# Patient Record
Sex: Male | Born: 1965
Health system: Southern US, Community
[De-identification: ages and names within clinical notes are randomized; demographics above are authoritative.]

## PROBLEM LIST (undated history)

## (undated) DIAGNOSIS — M1990 Unspecified osteoarthritis, unspecified site: Secondary | ICD-10-CM

## (undated) DIAGNOSIS — F209 Schizophrenia, unspecified: Secondary | ICD-10-CM

## (undated) DIAGNOSIS — I639 Cerebral infarction, unspecified: Secondary | ICD-10-CM

## (undated) DIAGNOSIS — F329 Major depressive disorder, single episode, unspecified: Secondary | ICD-10-CM

## (undated) DIAGNOSIS — F32A Depression, unspecified: Secondary | ICD-10-CM

## (undated) DIAGNOSIS — I10 Essential (primary) hypertension: Secondary | ICD-10-CM

## (undated) DIAGNOSIS — F319 Bipolar disorder, unspecified: Secondary | ICD-10-CM

## (undated) DIAGNOSIS — E785 Hyperlipidemia, unspecified: Secondary | ICD-10-CM

## (undated) DIAGNOSIS — M109 Gout, unspecified: Secondary | ICD-10-CM

## (undated) DIAGNOSIS — K279 Peptic ulcer, site unspecified, unspecified as acute or chronic, without hemorrhage or perforation: Secondary | ICD-10-CM

## (undated) HISTORY — PX: NECK SURGERY: SHX720

## (undated) HISTORY — PX: OTHER SURGICAL HISTORY: SHX169

## (undated) HISTORY — PX: CERVICAL FUSION: SHX112

## (undated) HISTORY — PX: HIP SURGERY: SHX245

## (undated) HISTORY — DX: Hyperlipidemia, unspecified: E78.5

## (undated) HISTORY — PX: FOOT SURGERY: SHX648

---

## 2000-02-12 ENCOUNTER — Inpatient Hospital Stay (HOSPITAL_COMMUNITY): Admission: EM | Admit: 2000-02-12 | Discharge: 2000-02-15 | Payer: Self-pay | Admitting: Psychiatry

## 2000-10-07 ENCOUNTER — Emergency Department (HOSPITAL_COMMUNITY): Admission: EM | Admit: 2000-10-07 | Discharge: 2000-10-07 | Payer: Self-pay | Admitting: *Deleted

## 2000-10-07 ENCOUNTER — Encounter: Payer: Self-pay | Admitting: *Deleted

## 2000-10-19 ENCOUNTER — Emergency Department (HOSPITAL_COMMUNITY): Admission: EM | Admit: 2000-10-19 | Discharge: 2000-10-19 | Payer: Self-pay | Admitting: Emergency Medicine

## 2002-03-03 ENCOUNTER — Encounter: Payer: Self-pay | Admitting: Emergency Medicine

## 2002-03-03 ENCOUNTER — Emergency Department (HOSPITAL_COMMUNITY): Admission: EM | Admit: 2002-03-03 | Discharge: 2002-03-03 | Payer: Self-pay | Admitting: Emergency Medicine

## 2003-04-10 ENCOUNTER — Encounter: Admission: RE | Admit: 2003-04-10 | Discharge: 2003-04-10 | Payer: Self-pay | Admitting: Internal Medicine

## 2003-04-25 ENCOUNTER — Emergency Department (HOSPITAL_COMMUNITY): Admission: EM | Admit: 2003-04-25 | Discharge: 2003-04-26 | Payer: Self-pay | Admitting: *Deleted

## 2004-02-15 ENCOUNTER — Emergency Department (HOSPITAL_COMMUNITY): Admission: EM | Admit: 2004-02-15 | Discharge: 2004-02-15 | Payer: Self-pay | Admitting: Emergency Medicine

## 2004-07-03 ENCOUNTER — Encounter: Admission: RE | Admit: 2004-07-03 | Discharge: 2004-08-15 | Payer: Self-pay | Admitting: Orthopedic Surgery

## 2004-08-23 ENCOUNTER — Emergency Department (HOSPITAL_COMMUNITY): Admission: EM | Admit: 2004-08-23 | Discharge: 2004-08-23 | Payer: Self-pay | Admitting: Emergency Medicine

## 2004-09-13 ENCOUNTER — Encounter: Admission: RE | Admit: 2004-09-13 | Discharge: 2004-09-13 | Payer: Self-pay | Admitting: Internal Medicine

## 2004-10-15 ENCOUNTER — Emergency Department (HOSPITAL_COMMUNITY): Admission: EM | Admit: 2004-10-15 | Discharge: 2004-10-15 | Payer: Self-pay | Admitting: Emergency Medicine

## 2004-11-05 ENCOUNTER — Emergency Department (HOSPITAL_COMMUNITY): Admission: EM | Admit: 2004-11-05 | Discharge: 2004-11-05 | Payer: Self-pay | Admitting: Emergency Medicine

## 2005-03-21 ENCOUNTER — Emergency Department (HOSPITAL_COMMUNITY): Admission: EM | Admit: 2005-03-21 | Discharge: 2005-03-21 | Payer: Self-pay | Admitting: Emergency Medicine

## 2005-08-25 ENCOUNTER — Emergency Department (HOSPITAL_COMMUNITY): Admission: EM | Admit: 2005-08-25 | Discharge: 2005-08-26 | Payer: Self-pay | Admitting: Emergency Medicine

## 2006-01-07 ENCOUNTER — Emergency Department (HOSPITAL_COMMUNITY): Admission: EM | Admit: 2006-01-07 | Discharge: 2006-01-07 | Payer: Self-pay | Admitting: Emergency Medicine

## 2006-02-26 ENCOUNTER — Encounter (HOSPITAL_COMMUNITY): Admission: RE | Admit: 2006-02-26 | Discharge: 2006-03-28 | Payer: Self-pay | Admitting: Orthopedic Surgery

## 2006-03-10 ENCOUNTER — Encounter: Admission: RE | Admit: 2006-03-10 | Discharge: 2006-03-25 | Payer: Self-pay | Admitting: Orthopedic Surgery

## 2007-02-18 ENCOUNTER — Emergency Department (HOSPITAL_COMMUNITY): Admission: EM | Admit: 2007-02-18 | Discharge: 2007-02-18 | Payer: Self-pay | Admitting: Emergency Medicine

## 2007-06-22 ENCOUNTER — Inpatient Hospital Stay (HOSPITAL_COMMUNITY): Admission: AC | Admit: 2007-06-22 | Discharge: 2007-06-25 | Payer: Self-pay

## 2007-08-17 ENCOUNTER — Emergency Department (HOSPITAL_COMMUNITY): Admission: EM | Admit: 2007-08-17 | Discharge: 2007-08-17 | Payer: Self-pay | Admitting: Emergency Medicine

## 2007-10-22 ENCOUNTER — Emergency Department (HOSPITAL_COMMUNITY): Admission: EM | Admit: 2007-10-22 | Discharge: 2007-10-23 | Payer: Self-pay | Admitting: Emergency Medicine

## 2008-07-31 ENCOUNTER — Emergency Department (HOSPITAL_COMMUNITY): Admission: EM | Admit: 2008-07-31 | Discharge: 2008-08-01 | Payer: Self-pay | Admitting: Emergency Medicine

## 2008-11-10 ENCOUNTER — Other Ambulatory Visit: Payer: Self-pay | Admitting: Emergency Medicine

## 2008-11-10 ENCOUNTER — Inpatient Hospital Stay (HOSPITAL_COMMUNITY): Admission: AD | Admit: 2008-11-10 | Discharge: 2008-11-13 | Payer: Self-pay | Admitting: Psychiatry

## 2008-11-10 ENCOUNTER — Ambulatory Visit: Payer: Self-pay | Admitting: Psychiatry

## 2008-12-05 ENCOUNTER — Encounter: Admission: RE | Admit: 2008-12-05 | Discharge: 2009-01-25 | Payer: Self-pay | Admitting: *Deleted

## 2009-01-18 ENCOUNTER — Emergency Department (HOSPITAL_COMMUNITY): Admission: EM | Admit: 2009-01-18 | Discharge: 2009-01-18 | Payer: Self-pay | Admitting: Emergency Medicine

## 2009-03-07 ENCOUNTER — Emergency Department (HOSPITAL_COMMUNITY): Admission: EM | Admit: 2009-03-07 | Discharge: 2009-03-07 | Payer: Self-pay | Admitting: Emergency Medicine

## 2009-04-02 ENCOUNTER — Emergency Department (HOSPITAL_COMMUNITY): Admission: EM | Admit: 2009-04-02 | Discharge: 2009-04-02 | Payer: Self-pay | Admitting: Emergency Medicine

## 2009-10-24 ENCOUNTER — Encounter
Admission: RE | Admit: 2009-10-24 | Discharge: 2010-01-22 | Payer: Self-pay | Source: Home / Self Care | Attending: *Deleted | Admitting: *Deleted

## 2010-02-17 ENCOUNTER — Encounter: Payer: Self-pay | Admitting: Internal Medicine

## 2010-05-02 LAB — URINALYSIS, ROUTINE W REFLEX MICROSCOPIC
Hgb urine dipstick: NEGATIVE
Nitrite: NEGATIVE
Protein, ur: NEGATIVE mg/dL
Urobilinogen, UA: 0.2 mg/dL (ref 0.0–1.0)

## 2010-05-02 LAB — BASIC METABOLIC PANEL
BUN: 9 mg/dL (ref 6–23)
CO2: 25 mEq/L (ref 19–32)
Calcium: 8.6 mg/dL (ref 8.4–10.5)
Chloride: 101 mEq/L (ref 96–112)
Creatinine, Ser: 0.98 mg/dL (ref 0.4–1.5)
Glucose, Bld: 143 mg/dL — ABNORMAL HIGH (ref 70–99)

## 2010-05-02 LAB — RAPID URINE DRUG SCREEN, HOSP PERFORMED
Amphetamines: NOT DETECTED
Barbiturates: NOT DETECTED
Tetrahydrocannabinol: NOT DETECTED

## 2010-05-02 LAB — CBC
MCHC: 35.4 g/dL (ref 30.0–36.0)
MCV: 94.8 fL (ref 78.0–100.0)
Platelets: 163 10*3/uL (ref 150–400)
RDW: 13.3 % (ref 11.5–15.5)

## 2010-05-02 LAB — DIFFERENTIAL
Basophils Absolute: 0 10*3/uL (ref 0.0–0.1)
Basophils Relative: 1 % (ref 0–1)
Eosinophils Absolute: 0.2 10*3/uL (ref 0.0–0.7)
Monocytes Relative: 7 % (ref 3–12)
Neutro Abs: 4.8 10*3/uL (ref 1.7–7.7)
Neutrophils Relative %: 56 % (ref 43–77)
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
## 2010-06-14 NOTE — H&P (Signed)
[facility]  Patient:    Frank Moses, Frank Moses                          MRN: [id number] Adm. Date:  [id number] Attending:  Veneta Penton Dictator:   Candi Leash. Theressa Stamps, N.P.                   Psychiatric Admission Assessment  DATE OF ADMISSION:  February 12, 2000  IDENTIFYING INFORMATION:  This is a 45 year old, single black male, voluntary admitted on February 12, 2000, for alcohol detox.  HISTORY OF PRESENT ILLNESS:  The patient presents with a history of alcohol dependence, he has been drinking greater than 18 years.  He states for the past five years he has been drinking 12-24 beers per day.  The patient drinks alone socially.  He has had a one-week history of sobriety.  The patient denies any DTs, seizures or blackouts.  His last drink was at 10 p.m. on Tuesday, February 11, 2000.  The patient reports he has been sleeping well. His appetite has been good.  He denies any weight loss.  He denies any depression or anxiety.  He does admit to feeling angry and irritable when he drinks.  The patient feels motivated to stop drinking for his family and to see his daughter.  He denies auditory or visual hallucinations.  No suicidal or homicidal ideation.  No paranoia.  The patient [facility]  SOCIAL HISTORY:  He is a 45 year old, single black male.  He has two children, ages 76 and 20.  He lives with his mother and stepdad.  He works at [employer].  He has graduated from high school.  He has no financial or legal problems, although it does state that he has had three DUIs, last time being in June of 2001.  FAMILY HISTORY:  His father had problems with alcohol.  ALCOHOL/DRUG HISTORY:  He smokes two packs of cigarettes a day.  He has been smoking for 18 years.  His alcohol habits, the patient has been drinking since the age of 4 and as stated for the past five years has been drinking 12-24 beers per day.   He states he does not drink any hard liquor.  He denies any substance abuse.  PRIMARY CARE PHYSICIAN:  Dr. Florian Buff in [place].  PAST MEDICAL HISTORY:  Medical problems:  None.  MEDICATIONS:  None.  DRUG ALLERGIES:  None.  The patient states he is allergic to BEESTINGS.  PHYSICAL EXAMINATION:  Pending.  His lab results are pending.  The patient is 6 feet 2 inches, 180 pounds.  His vital signs are stable.  Blood pressure is slightly elevated at 149/87.  MENTAL STATUS EXAMINATION:  He is an alert, young black male.  He appears his stated age.  He is cooperative.  He is dressed in [employer].  He appears neat.  His speech is normal and relevant.  His mood is pleasant.  His affect is appropriate to mood.  His thought processes are coherent.  There is no evidence of psychosis.  No auditory or visual hallucinations.  No suicide or homicidal ideation.  No paranoia.  Cognitive function is intact.  His memory is good.  His judgment is poor.  Insight is fair.  Poor impulse control.  ADMISSION DIAGNOSES: Axis I:    Alcohol dependence. Axis II:   Deferred. Axis III:  None. Axis IV:  Mild, problems relating to primary support group. Axis V:    Current is 6, this past year is 70.  PLAN:  Voluntary admission to [facility] for alcohol dependence. Contract for safety.  Check q.15 minutes.  We will initiate the phenobarbital protocol.  Will encourage fluids.  Will add Depakote ER 500 mg at bedtime. Restoril ordered for sleep.  His labs are pending.  Our plan is to return him to his prior living arrangements with patient to attend AA after discharge.  TENTATIVE LENGTH OF STAY:  Three to five days. DD:  02/13/00 TD:  02/14/00 Job: [id number]

## 2010-06-14 NOTE — Consult Note (Signed)
[facility]  Patient:    Frank Moses, Frank Moses                          MRN: [id number] Adm. Date:  [id number] Disc. Date: [id number] Attending:  Veneta Penton Dictator:   Candi Leash. Theressa Stamps, N.P.                          Consultation Report  HISTORY OF PRESENT ILLNESS: This is a 45 year old single black male voluntarily admitted for alcohol detoxification.  The patient presented with a history of alcohol dependence, been drinking for over 18 years.  For the past five years, the patient has been drinking 12 to 24 beers per day.  He drinks socially.  The patient has had a one week history of sobriety.  The patient denies any DTs, seizures, or blackouts.  The patient reports he has been sleeping well.  His appetite has been good, he denies any weight loss, he denies any depression or anxiety; he does admit to feeling angry and irritable when he drinks.  The patient feels very motivated to stop drinking for his family and to see his daughter.  He denies any auditory hallucinations, no suicidal or homicidal ideations, no paranoia.  The patient has been in counseling for alcohol abuse at [facility].  PAST MEDICAL HISTORY:  Primary care physician is Dr. Florian Buff in [place].  No significant medical problems.  The patient takes no medications.  Drug allergies: None.  Physical examination: Height 6 feet 2 inches, weight 180 pounds.  Vital signs are stable although blood pressure was mildly elevated at 149/87.  Lab data: CBC was within normal limits. Chemistries were within normal limits.  Urine drug screen: Positive for barbiturates and benzodiazepines.  MENTAL STATUS EXAMINATION:  Alert, young black male, appears his stated age. Cooperative, dressed in hospital wear, he appears neat.  Speech is normal and relevant.  Mood is pleasant.  Affect is appropriate to mood.  Thought processes are coherent.  No evidence of psychosis, no auditory or visual hallucinations, no  suicidal or homicidal ideations, no paranoia.  Cognitive functioning is intact.  Memory is good.  Judgment is poor.  Insight is fair. Poor impulse control.  ADMITTING DIAGNOSES: Axis I:    Alcohol dependence. Axis II:   Deferred. Axis III:  None. Axis IV:   Mild with problems relating to primary support group. Axis V:    Current is 8, this past year is 70.  HOSPITAL COURSE:  The patient was a voluntary admission for alcohol dependence. The patient is to contract for safety.  He will be monitored every 15 minutes. Phenobarbital protocol was initiated for alcohol detoxification. Fluids well be encouraged.  Depakote ER 500 mg was ordered at bedtime with Restoril ordered for sleep.  The patient was progressing through the detoxification program unremarkably and was sleeping and eating well.  He was denying any mood shift into depression or mania and the patient was stating he was motivated to stop his drinking and attend [facility].  The patient continued to do well without any withdrawal symptoms, no anger or irritability, sleeping and eating well.  Mood and affect were euthymic.  It was felt that the patient could be discharged and managed on an outpatient basis.  FOLLOWUP: 1. [facility]; appointment schedules with phone number provided. 2. AA meetings. 3. The patient was also to get lab  work at [facility].  DISCHARGE MEDICATIONS: 1. Depakote ER 500 mg one tablet q.h.s. 2. Multivitamin one tablet p.o. q.d.  DISCHARGE DIAGNOSES: Axis I:    Alcohol dependence. Axis II:   Deferred. Axis III:  None. Axis IV:   Mild with problems relating to primary support group. Axis V:    Current is 38, this past year is 70.DD:  03/11/00 TD:  03/12/00 Job: [id number]

## 2010-10-17 LAB — CBC
Hemoglobin: 15.9
RBC: 4.86

## 2010-10-17 LAB — BASIC METABOLIC PANEL
CO2: 26
Calcium: 9
GFR calc Af Amer: >60
GFR calc non Af Amer: >60
Potassium: 3.5
Sodium: 137

## 2010-10-17 LAB — DIFFERENTIAL
Lymphocytes Relative: 23
Monocytes Absolute: 0.7
Monocytes Relative: 9
Neutro Abs: 5.3

## 2010-10-17 LAB — POCT CARDIAC MARKERS
CKMB, poc: 1.8
Myoglobin, poc: 52.1
Operator id: 213671
Troponin i, poc: <0.05

## 2010-10-17 LAB — PROTIME-INR: INR: 0.9

## 2010-10-23 LAB — POCT I-STAT, CHEM 8
Calcium, Ion: 1.01 — ABNORMAL LOW
Chloride: 109
Creatinine, Ser: 1.1
Glucose, Bld: 106 — ABNORMAL HIGH
HCT: 41

## 2010-10-23 LAB — TYPE AND SCREEN

## 2010-10-23 LAB — PROTIME-INR
INR: 1
Prothrombin Time: 13.3

## 2010-10-23 LAB — BASIC METABOLIC PANEL
BUN: 5 — ABNORMAL LOW
CO2: 24
Chloride: 110
Creatinine, Ser: 0.83
Glucose, Bld: 96
Potassium: 4.1

## 2010-10-23 LAB — CBC
HCT: 39
MCHC: 34.7
MCHC: 35.1
MCV: 94.4
Platelets: 202
RBC: 4.26
RDW: 13.1
WBC: 6
WBC: 8

## 2010-10-28 LAB — DIFFERENTIAL
Eosinophils Absolute: 0.3
Lymphs Abs: 2.6
Monocytes Relative: 8
Neutro Abs: 2.8
Neutrophils Relative %: 45

## 2010-10-28 LAB — CBC
MCV: 93.4
Platelets: 216
RBC: 4.98
WBC: 6.2

## 2010-10-28 LAB — BASIC METABOLIC PANEL
BUN: 5 — ABNORMAL LOW
Calcium: 9.1
Creatinine, Ser: 0.92
GFR calc Af Amer: >60
GFR calc non Af Amer: >60

## 2010-10-28 LAB — ETHANOL: Alcohol, Ethyl (B): 155 — ABNORMAL HIGH

## 2011-02-20 ENCOUNTER — Emergency Department (HOSPITAL_COMMUNITY)
Admission: EM | Admit: 2011-02-20 | Discharge: 2011-02-20 | Disposition: A | Discharge status: Home / Self Care | Payer: Medicare Other | Attending: Emergency Medicine | Admitting: Emergency Medicine

## 2011-02-20 ENCOUNTER — Encounter (HOSPITAL_COMMUNITY): Payer: Self-pay | Admitting: *Deleted

## 2011-02-20 DIAGNOSIS — Z79899 Other long term (current) drug therapy: Secondary | ICD-10-CM | POA: Insufficient documentation

## 2011-02-20 DIAGNOSIS — G8929 Other chronic pain: Secondary | ICD-10-CM | POA: Insufficient documentation

## 2011-02-20 DIAGNOSIS — M549 Dorsalgia, unspecified: Secondary | ICD-10-CM | POA: Insufficient documentation

## 2011-02-20 DIAGNOSIS — F17200 Nicotine dependence, unspecified, uncomplicated: Secondary | ICD-10-CM | POA: Insufficient documentation

## 2011-02-20 DIAGNOSIS — J329 Chronic sinusitis, unspecified: Secondary | ICD-10-CM | POA: Insufficient documentation

## 2011-02-20 DIAGNOSIS — F319 Bipolar disorder, unspecified: Secondary | ICD-10-CM | POA: Insufficient documentation

## 2011-02-20 DIAGNOSIS — Z8739 Personal history of other diseases of the musculoskeletal system and connective tissue: Secondary | ICD-10-CM | POA: Insufficient documentation

## 2011-02-20 DIAGNOSIS — I10 Essential (primary) hypertension: Secondary | ICD-10-CM | POA: Insufficient documentation

## 2011-02-20 DIAGNOSIS — J45909 Unspecified asthma, uncomplicated: Secondary | ICD-10-CM | POA: Insufficient documentation

## 2011-02-20 HISTORY — DX: Cerebral infarction, unspecified: I63.9

## 2011-02-20 HISTORY — DX: Essential (primary) hypertension: I10

## 2011-02-20 HISTORY — DX: Unspecified osteoarthritis, unspecified site: M19.90

## 2011-02-20 HISTORY — DX: Major depressive disorder, single episode, unspecified: F32.9

## 2011-02-20 HISTORY — DX: Bipolar disorder, unspecified: F31.9

## 2011-02-20 HISTORY — DX: Schizophrenia, unspecified: F20.9

## 2011-02-20 HISTORY — DX: Peptic ulcer, site unspecified, unspecified as acute or chronic, without hemorrhage or perforation: K27.9

## 2011-02-20 HISTORY — DX: Depression, unspecified: F32.A

## 2011-02-20 MED ORDER — PSEUDOEPHEDRINE HCL 60 MG PO TABS: 60.0000 mg | ORAL_TABLET | Freq: Four times a day (QID) | ORAL | Status: AC

## 2011-02-20 MED ORDER — DEXAMETHASONE 6 MG PO TABS: ORAL_TABLET | ORAL | Status: AC

## 2011-02-20 MED ORDER — HYDROCODONE-ACETAMINOPHEN 7.5-325 MG PO TABS: 1.0000 | ORAL_TABLET | ORAL | Status: AC | PRN

## 2011-02-20 NOTE — ED Notes (Signed)
Lt ear "stopped up" and low back pain .

## 2011-02-20 NOTE — ED Provider Notes (Signed)
History     CSN: [id number]  Arrival date & time 02/20/11  1236   First MD Initiated Contact with Patient 02/20/11 1313      Chief Complaint  Patient presents with  . Otalgia    (Consider location/radiation/quality/duration/timing/severity/associated sxs/prior treatment) HPI Comments: Patient states that 2-3 times a year he has problems with his ears being" stopped up". He has been having problems with his ears over the last week. He has tried over-the-counter medications without success, and presents now to the emergency department for additional evaluation  Patient also has chronic back problems states he has problems with arthritis and is disabled. The patient states he's been doing more standing washing and make an occasional bending and thinks he may have aggravated his back as a result of that. He requests that this be evaluated also for assistance with his pain.  Patient is a 46 y.o. male presenting with ear pain. The history is provided by the patient.  Otalgia Pertinent negatives include no abdominal pain, no neck pain and no cough.    Past Medical History  Diagnosis Date  . Hypertension   . Arthritis   . Bipolar 1 disorder   . Schizophrenia   . Depression   . Stroke   . Asthma   . Peptic ulcer     Past Surgical History  Procedure Date  . Cervical fusion     History reviewed. No pertinent family history.  History  Substance Use Topics  . Smoking status: Current Everyday Smoker    Types: Cigarettes  . Smokeless tobacco: Not on file  . Alcohol Use: No      Review of Systems  Constitutional: Negative for activity change.       All ROS Neg except as noted in HPI  HENT: Positive for ear pain and postnasal drip. Negative for nosebleeds and neck pain.   Eyes: Negative for photophobia and discharge.  Respiratory: Negative for cough, shortness of breath and wheezing.   Cardiovascular: Negative for chest pain and palpitations.  Gastrointestinal: Negative for  abdominal pain and blood in stool.  Genitourinary: Negative for dysuria, frequency and hematuria.  Musculoskeletal: Positive for back pain and arthralgias.  Skin: Negative.   Neurological: Negative for dizziness, seizures and speech difficulty.  Psychiatric/Behavioral: Negative for hallucinations and confusion.    Allergies  Review of patient's allergies indicates no known allergies.  Home Medications   Current Outpatient Rx  Name Route Sig Dispense Refill  . VENLAFAXINE HCL ER 150 MG PO CP24 Oral Take 150 mg by mouth daily.    Marland Kitchen DEXAMETHASONE 6 MG PO TABS  1 po bid with food 12 tablet 0  . HYDROCODONE-ACETAMINOPHEN 7.5-325 MG PO TABS Oral Take 1 tablet by mouth every 4 (four) hours as needed for pain. 20 tablet 0  . PSEUDOEPHEDRINE HCL 60 MG PO TABS Oral Take 1 tablet (60 mg total) by mouth 4 (four) times daily. 30 tablet 0    BP 153/95  Pulse 72  Temp(Src) 98.4 F (36.9 C) (Oral)  Resp 18  Ht 6\' 2"  (1.88 m)  Wt 200 lb (90.719 kg)  BMI 25.68 kg/m2  SpO2 99%  Physical Exam  Nursing note and vitals reviewed. Constitutional: He is oriented to person, place, and time. He appears well-developed and well-nourished.  Non-toxic appearance.  HENT:  Head: Normocephalic.  Right Ear: Tympanic membrane and external ear normal.  Left Ear: Tympanic membrane and external ear normal.       Nasal congestion present. No pain  to palpation over the sinuses.  Eyes: EOM and lids are normal. Pupils are equal, round, and reactive to light.  Neck: Normal range of motion. Neck supple. Carotid bruit is not present.  Cardiovascular: Normal rate, regular rhythm, normal heart sounds, intact distal pulses and normal pulses.   Pulmonary/Chest: Breath sounds normal. No respiratory distress.  Abdominal: Soft. Bowel sounds are normal. There is no tenderness. There is no guarding.  Musculoskeletal: Normal range of motion.       Pain to palpation and attempted range of motion of the lower spine area.    Lymphadenopathy:       Head (right side): No submandibular adenopathy present.       Head (left side): No submandibular adenopathy present.    He has no cervical adenopathy.  Neurological: He is alert and oriented to person, place, and time. He has normal strength. No cranial nerve deficit or sensory deficit. He exhibits normal muscle tone. Coordination normal.  Skin: Skin is warm and dry.  Psychiatric: He has a normal mood and affect. His speech is normal.    ED Course  Procedures (including critical care time) Pulse oximetry 99% on room air. Within normal limits by my interpretation. Labs Reviewed - No data to display No results found.   1. Sinusitis   2. Back pain, chronic       MDM  I have reviewed nursing notes, vital signs, and all appropriate lab and imaging results for this patient. Patient examination is consistent with sinusitis. He has pain in the lower back with outpatient and attempted range of motion similar to previous bouts with chronic back pain. Prescription for Sudafed every 6 hours, Decadron 2 times daily with food, and Norco every 4 hours as needed for pain #20 given to the patient. Patient advised to see his primary physician for additional evaluation and treatment.       Kathie Dike, [place] 02/20/11 1422

## 2011-02-20 NOTE — ED Provider Notes (Signed)
Medical screening examination/treatment/procedure(s) were conducted as a shared visit with non-physician practitioner(s) and myself.  I personally evaluated the patient during the encounter Frank Moses Y.   [name], MD 02/20/11 1439

## 2011-08-19 ENCOUNTER — Encounter (HOSPITAL_COMMUNITY): Payer: Self-pay | Admitting: *Deleted

## 2011-08-19 ENCOUNTER — Emergency Department (HOSPITAL_COMMUNITY)
Admission: EM | Admit: 2011-08-19 | Discharge: 2011-08-19 | Disposition: A | Discharge status: Home / Self Care | Payer: Medicaid Other | Attending: Emergency Medicine | Admitting: Emergency Medicine

## 2011-08-19 DIAGNOSIS — J45909 Unspecified asthma, uncomplicated: Secondary | ICD-10-CM | POA: Insufficient documentation

## 2011-08-19 DIAGNOSIS — F17200 Nicotine dependence, unspecified, uncomplicated: Secondary | ICD-10-CM | POA: Insufficient documentation

## 2011-08-19 DIAGNOSIS — I10 Essential (primary) hypertension: Secondary | ICD-10-CM | POA: Insufficient documentation

## 2011-08-19 DIAGNOSIS — F209 Schizophrenia, unspecified: Secondary | ICD-10-CM | POA: Insufficient documentation

## 2011-08-19 DIAGNOSIS — Z8673 Personal history of transient ischemic attack (TIA), and cerebral infarction without residual deficits: Secondary | ICD-10-CM | POA: Insufficient documentation

## 2011-08-19 DIAGNOSIS — Z79899 Other long term (current) drug therapy: Secondary | ICD-10-CM | POA: Insufficient documentation

## 2011-08-19 DIAGNOSIS — R51 Headache: Secondary | ICD-10-CM | POA: Insufficient documentation

## 2011-08-19 DIAGNOSIS — F319 Bipolar disorder, unspecified: Secondary | ICD-10-CM | POA: Insufficient documentation

## 2011-08-19 LAB — SEDIMENTATION RATE: Sed Rate: 10 mm/hr (ref 0–16)

## 2011-08-19 MED ORDER — HYDROCODONE-ACETAMINOPHEN 5-325 MG PO TABS: 1.0000 | ORAL_TABLET | ORAL | Status: AC | PRN

## 2011-08-19 MED ORDER — HYDROMORPHONE HCL PF 2 MG/ML IJ SOLN
2.0000 mg | Freq: Once | INTRAMUSCULAR | Status: AC
  Administered 2011-08-19: 2 mg via INTRAMUSCULAR
  Filled 2011-08-19: qty 1

## 2011-08-19 MED ORDER — KETOROLAC TROMETHAMINE 60 MG/2ML IM SOLN
60.0000 mg | Freq: Once | INTRAMUSCULAR | Status: AC
  Administered 2011-08-19: 60 mg via INTRAMUSCULAR
  Filled 2011-08-19: qty 2

## 2011-08-19 NOTE — ED Provider Notes (Signed)
History     CSN: [id number]  Arrival date & time 08/19/11  2018   First MD Initiated Contact with Patient 08/19/11 2038      Chief Complaint  Patient presents with  . Headache     The history is provided by the patient.   patient reports one week of left-sided headache with focus located near his left eye.  He denies visual changes.  He has no discharge from his eye.  He has had no recent trauma to his heterozygote.  He denies pain with extraocular movement.  He has no fevers or chills.  Denies neck pain.  He has no weakness of his upper lower extremities.  He does not take anticoagulants.  He occasionally gets headaches but reports this is more severe.  He thinks it started after starting a new cholesterol medicine last week.  He is otherwise without symptoms.  His pain is mild to moderate at this time.  His pain is not improved with over-the-counter medications.  His pain is not worsened by light or loud noises  Past Medical History  Diagnosis Date  . Hypertension   . Arthritis   . Bipolar 1 disorder   . Schizophrenia   . Depression   . Stroke   . Asthma   . Peptic ulcer     Past Surgical History  Procedure Date  . Cervical fusion     No family history on file.  History  Substance Use Topics  . Smoking status: Current Everyday Smoker    Types: Cigarettes  . Smokeless tobacco: Not on file  . Alcohol Use: No      Review of Systems  Neurological: Positive for headaches.  All other systems reviewed and are negative.    Allergies  Tomato  Home Medications   Current Outpatient Rx  Name Route Sig Dispense Refill  . ASPIRIN-SALICYLAMIDE-CAFFEINE 325-95-16 MG PO TABS Oral Take 2 packets by mouth as needed.    Marland Kitchen HYDROCHLOROTHIAZIDE 25 MG PO TABS Oral Take 25 mg by mouth daily.    . IBUPROFEN 200 MG PO TABS Oral Take 800 mg by mouth as needed.    . INDOMETHACIN 50 MG PO CAPS Oral Take 50 mg by mouth 3 (three) times daily as needed. Gout Flare    . LANSOPRAZOLE 30  MG PO CPDR Oral Take 30 mg by mouth 2 (two) times daily.    Marland Kitchen NAPROXEN SODIUM 220 MG PO CAPS Oral Take 220 mg by mouth as needed.    . VENLAFAXINE HCL ER 150 MG PO CP24 Oral Take 150 mg by mouth daily.    Marland Kitchen HYDROCODONE-ACETAMINOPHEN 5-325 MG PO TABS Oral Take 1 tablet by mouth every 4 (four) hours as needed for pain. 15 tablet 0    BP 134/88  Pulse 76  Temp 98.3 F (36.8 C) (Oral)  Resp 18  Ht 6\' 2"  (1.88 m)  Wt 197 lb (89.359 kg)  BMI 25.29 kg/m2  SpO2 95%  Physical Exam  Nursing note and vitals reviewed. Constitutional: He is oriented to person, place, and time. He appears well-developed and well-nourished.  HENT:  Head: Normocephalic and atraumatic.  Eyes: EOM are normal. Pupils are equal, round, and reactive to light.       Left eye without periorbital edema or swelling or erythema.  No proptosis noted.   Neck: Normal range of motion.  Cardiovascular: Normal rate, regular rhythm, normal heart sounds and intact distal pulses.   Pulmonary/Chest: Effort normal and breath sounds normal. No  respiratory distress.  Abdominal: Soft. He exhibits no distension. There is no tenderness.  Musculoskeletal: Normal range of motion.  Neurological: He is alert and oriented to person, place, and time.       5/5 strength in major muscle groups of  bilateral upper and lower extremities. Speech normal. No facial asymetry.   Skin: Skin is warm and dry.  Psychiatric: He has a normal mood and affect. Judgment normal.    ED Course  Procedures (including critical care time)   Labs Reviewed  SEDIMENTATION RATE   No results found.   1. Headache       MDM  The patient has a normal neurologic exam.  He is no tenderness around his left temporal artery.  A sedimentation rate was sent but will be a send out this is more for followup purposes.  His pain is improved in the emergency department.  No indication for imaging.  Normal neuro exam no recent trauma.  Discharge home with PCP  followup        Lyanne Co, MD 08/19/11 2258

## 2011-08-19 NOTE — ED Notes (Signed)
States he started taking meds for cholestrol a week ago and developed a headache, states he feels like his left eye is going to pop out of his head, states this is the worse headache ever

## 2011-10-20 ENCOUNTER — Emergency Department (HOSPITAL_COMMUNITY): Payer: Medicare Other

## 2011-10-20 ENCOUNTER — Encounter (HOSPITAL_COMMUNITY): Payer: Self-pay | Admitting: *Deleted

## 2011-10-20 ENCOUNTER — Emergency Department (HOSPITAL_COMMUNITY)
Admission: EM | Admit: 2011-10-20 | Discharge: 2011-10-20 | Disposition: A | Discharge status: Home / Self Care | Payer: Medicare Other | Attending: Emergency Medicine | Admitting: Emergency Medicine

## 2011-10-20 DIAGNOSIS — Z8673 Personal history of transient ischemic attack (TIA), and cerebral infarction without residual deficits: Secondary | ICD-10-CM | POA: Insufficient documentation

## 2011-10-20 DIAGNOSIS — M129 Arthropathy, unspecified: Secondary | ICD-10-CM | POA: Insufficient documentation

## 2011-10-20 DIAGNOSIS — M79609 Pain in unspecified limb: Secondary | ICD-10-CM | POA: Insufficient documentation

## 2011-10-20 DIAGNOSIS — I10 Essential (primary) hypertension: Secondary | ICD-10-CM | POA: Insufficient documentation

## 2011-10-20 DIAGNOSIS — M109 Gout, unspecified: Secondary | ICD-10-CM

## 2011-10-20 DIAGNOSIS — F319 Bipolar disorder, unspecified: Secondary | ICD-10-CM | POA: Insufficient documentation

## 2011-10-20 DIAGNOSIS — F17200 Nicotine dependence, unspecified, uncomplicated: Secondary | ICD-10-CM | POA: Insufficient documentation

## 2011-10-20 DIAGNOSIS — F209 Schizophrenia, unspecified: Secondary | ICD-10-CM | POA: Insufficient documentation

## 2011-10-20 LAB — URIC ACID: Uric Acid, Serum: 8.8 mg/dL — ABNORMAL HIGH (ref 4.0–7.8)

## 2011-10-20 MED ORDER — PREDNISONE 10 MG PO TABS: ORAL_TABLET | ORAL | Status: DC

## 2011-10-20 MED ORDER — OXYCODONE-ACETAMINOPHEN 5-325 MG PO TABS
1.0000 | ORAL_TABLET | Freq: Once | ORAL | Status: AC
  Administered 2011-10-20: 1 via ORAL
  Filled 2011-10-20: qty 1

## 2011-10-20 MED ORDER — KETOROLAC TROMETHAMINE 60 MG/2ML IM SOLN
60.0000 mg | Freq: Once | INTRAMUSCULAR | Status: AC
  Administered 2011-10-20: 60 mg via INTRAMUSCULAR
  Filled 2011-10-20: qty 2

## 2011-10-20 MED ORDER — OXYCODONE-ACETAMINOPHEN 5-325 MG PO TABS: 1.0000 | ORAL_TABLET | ORAL | Status: AC | PRN

## 2011-10-20 NOTE — ED Provider Notes (Signed)
History     CSN: [id number]  Arrival date & time 10/20/11  1528   First MD Initiated Contact with Patient 10/20/11 1603      Chief Complaint  Patient presents with  . Foot Pain    (Consider location/radiation/quality/duration/timing/severity/associated sxs/prior treatment) HPI Comments: Patient with hx of gout c/o sudden onset of pain, swelling to his left foot.  Pain is worse with weight bearing.  Feels similar to previous gout attacks.  He denies trauma, calf pain, wound to the foot, or fever.  Patient is a 46 y.o. male presenting with lower extremity pain. The history is provided by the patient.  Foot Pain This is a recurrent problem. The current episode started in the past 7 days. The problem occurs constantly. The problem has been gradually worsening. Associated symptoms include arthralgias and joint swelling. Pertinent negatives include no chills, fever, nausea, neck pain, numbness, rash, visual change, vomiting or weakness. The symptoms are aggravated by standing and walking. He has tried nothing for the symptoms. The treatment provided no relief.    Past Medical History  Diagnosis Date  . Hypertension   . Arthritis   . Bipolar 1 disorder   . Schizophrenia   . Depression   . Stroke   . Asthma   . Peptic ulcer     Past Surgical History  Procedure Date  . Cervical fusion   . Bil foot surgery     History reviewed. No pertinent family history.  History  Substance Use Topics  . Smoking status: Current Every Day Smoker    Types: Cigarettes  . Smokeless tobacco: Not on file  . Alcohol Use: No      Review of Systems  Constitutional: Negative for fever and chills.  HENT: Negative for neck pain.   Gastrointestinal: Negative for nausea and vomiting.  Genitourinary: Negative for dysuria and difficulty urinating.  Musculoskeletal: Positive for joint swelling and arthralgias.  Skin: Negative for color change, rash and wound.  Neurological: Negative for weakness and  numbness.  All other systems reviewed and are negative.    Allergies  Shrimp and Tomato  Home Medications   Current Outpatient Rx  Name Route Sig Dispense Refill  . ASPIRIN-SALICYLAMIDE-CAFFEINE 325-95-16 MG PO TABS Oral Take 2 packets by mouth as needed.    Marland Kitchen HYDROCHLOROTHIAZIDE 25 MG PO TABS Oral Take 25 mg by mouth daily.    . IBUPROFEN 200 MG PO TABS Oral Take 800 mg by mouth as needed.    . INDOMETHACIN 50 MG PO CAPS Oral Take 50 mg by mouth 3 (three) times daily as needed. Gout Flare    . LANSOPRAZOLE 30 MG PO CPDR Oral Take 30 mg by mouth 2 (two) times daily.    Marland Kitchen NAPROXEN SODIUM 220 MG PO CAPS Oral Take 220 mg by mouth as needed.    . VENLAFAXINE HCL ER 150 MG PO CP24 Oral Take 150 mg by mouth daily.      BP 178/99  Pulse 76  Temp 99.7 F (37.6 C) (Oral)  Resp 18  Ht 6\' 2"  (1.88 m)  Wt 195 lb (88.451 kg)  BMI 25.04 kg/m2  SpO2 100%  Physical Exam  Nursing note and vitals reviewed. Constitutional: He is oriented to person, place, and time. He appears well-developed and well-nourished. No distress.  HENT:  Head: Normocephalic and atraumatic.  Cardiovascular: Normal rate, regular rhythm, normal heart sounds and intact distal pulses.   Pulmonary/Chest: Effort normal and breath sounds normal.  Musculoskeletal: He exhibits tenderness.  Left foot: He exhibits tenderness and swelling. He exhibits normal range of motion, no bony tenderness, normal capillary refill, no crepitus, no deformity and no laceration.       Feet:       ttp of the left medial foot.  Mild erythema and STS.  ROM is preserved.  DP pulse is brisk, sensation intact.  No lesions of the foot, abrasion, bruising or deformity.    Neurological: He is alert and oriented to person, place, and time. He exhibits normal muscle tone. Coordination normal.  Skin: Skin is warm and dry.    ED Course  Procedures (including critical care time)  Labs Reviewed  URIC ACID - Abnormal; Notable for the following:      Uric Acid, Serum 8.8 (*)     All other components within normal limits   Dg Foot Complete Left  10/20/2011  *RADIOLOGY REPORT*  Clinical Data: Left foot pain and swelling.  LEFT FOOT - COMPLETE 3+ VIEW  Comparison: 08/23/2004  Findings: Three views of the left foot were obtained.  Stable surgical clip or staple near the talocalcaneal joint.  Stable flat foot deformity.  Degenerative changes along the dorsal aspect of the foot.  No evidence for acute fracture or dislocation.  Chronic deformity of the talus.  IMPRESSION: No acute bony abnormality.   Original Report Authenticated By: Richarda Overlie, M.D.         MDM    Pt has STS and ttp of the medial left foot.  Mild erythema also present,  Hx of gout to same foot.  No fever, chills or open wounds to the foot.  Doubtful of infectious process.  Will treat with percocet, prednisone.  Pt agrees to close f/u with PMD or to return here if sx's worsen.  The patient appears reasonably screened and/or stabilized for discharge and I doubt any other medical condition or other [facility] requiring further screening, evaluation, or treatment in the ED at this time prior to discharge.    Rx;  Percocet #20 Prednisone taper   Perrie Ragin L. [place], [place] 10/22/11 1724

## 2011-10-20 NOTE — ED Notes (Signed)
Pain and swelling lt foot, says is due to gout,  No injury  , wearing a cam walker

## 2011-10-22 NOTE — ED Provider Notes (Signed)
Medical screening examination/treatment/procedure(s) were performed by non-physician practitioner and as supervising physician I was immediately available for consultation/collaboration.   Shelda Jakes, MD 10/22/11 [phone]

## 2011-11-11 ENCOUNTER — Encounter (HOSPITAL_COMMUNITY): Payer: Self-pay

## 2011-11-11 ENCOUNTER — Emergency Department (HOSPITAL_COMMUNITY)
Admission: EM | Admit: 2011-11-11 | Discharge: 2011-11-11 | Disposition: A | Discharge status: Home / Self Care | Payer: Medicare Other | Attending: Emergency Medicine | Admitting: Emergency Medicine

## 2011-11-11 DIAGNOSIS — M109 Gout, unspecified: Secondary | ICD-10-CM

## 2011-11-11 DIAGNOSIS — M129 Arthropathy, unspecified: Secondary | ICD-10-CM | POA: Insufficient documentation

## 2011-11-11 DIAGNOSIS — M79673 Pain in unspecified foot: Secondary | ICD-10-CM

## 2011-11-11 DIAGNOSIS — Z8673 Personal history of transient ischemic attack (TIA), and cerebral infarction without residual deficits: Secondary | ICD-10-CM | POA: Insufficient documentation

## 2011-11-11 DIAGNOSIS — I10 Essential (primary) hypertension: Secondary | ICD-10-CM | POA: Insufficient documentation

## 2011-11-11 DIAGNOSIS — F17200 Nicotine dependence, unspecified, uncomplicated: Secondary | ICD-10-CM | POA: Insufficient documentation

## 2011-11-11 DIAGNOSIS — F209 Schizophrenia, unspecified: Secondary | ICD-10-CM | POA: Insufficient documentation

## 2011-11-11 DIAGNOSIS — F319 Bipolar disorder, unspecified: Secondary | ICD-10-CM | POA: Insufficient documentation

## 2011-11-11 MED ORDER — OXYCODONE-ACETAMINOPHEN 5-325 MG PO TABS: 1.0000 | ORAL_TABLET | ORAL | Status: AC | PRN

## 2011-11-11 MED ORDER — KETOROLAC TROMETHAMINE 60 MG/2ML IM SOLN
60.0000 mg | Freq: Once | INTRAMUSCULAR | Status: AC
  Administered 2011-11-11: 60 mg via INTRAMUSCULAR
  Filled 2011-11-11: qty 2

## 2011-11-11 MED ORDER — OXYCODONE-ACETAMINOPHEN 5-325 MG PO TABS
1.0000 | ORAL_TABLET | Freq: Once | ORAL | Status: AC
  Administered 2011-11-11: 1 via ORAL
  Filled 2011-11-11: qty 1

## 2011-11-11 MED ORDER — PREDNISONE 10 MG PO TABS: ORAL_TABLET | ORAL | Status: DC

## 2011-11-11 NOTE — ED Provider Notes (Signed)
History     CSN: [id number]  Arrival date & time 11/11/11  1406   First MD Initiated Contact with Patient 11/11/11 1458      Chief Complaint  Patient presents with  . Gout    (Consider location/radiation/quality/duration/timing/severity/associated sxs/prior treatment) HPI Comments: Patient with a history of frequent gout attacks complains of sudden onset of pain to his left foot. He states pain began after he eating pork. He states the pain feels similar to previous gout attacks. He denies recent injury, swelling, excessive warmth, or any wounds to his foot. He also states that he is out of his pain medication.  Patient is a 46 y.o. male presenting with lower extremity pain. The history is provided by the patient.  Foot Pain This is a recurrent problem. The current episode started in the past 7 days. The problem occurs constantly. The problem has been gradually worsening. Associated symptoms include arthralgias and joint swelling. Pertinent negatives include no chills, fever, myalgias, nausea, neck pain, numbness, rash, vomiting or weakness. The symptoms are aggravated by standing and walking. He has tried NSAIDs for the symptoms. The treatment provided mild relief.    Past Medical History  Diagnosis Date  . Hypertension   . Arthritis   . Bipolar 1 disorder   . Schizophrenia   . Depression   . Stroke   . Asthma   . Peptic ulcer     Past Surgical History  Procedure Date  . Cervical fusion   . Bil foot surgery     No family history on file.  History  Substance Use Topics  . Smoking status: Current Every Day Smoker    Types: Cigarettes  . Smokeless tobacco: Not on file  . Alcohol Use: No      Review of Systems  Constitutional: Negative for fever and chills.  HENT: Negative for neck pain.   Gastrointestinal: Negative for nausea and vomiting.  Genitourinary: Negative for dysuria and difficulty urinating.  Musculoskeletal: Positive for joint swelling and  arthralgias. Negative for myalgias.  Skin: Negative for color change, rash and wound.  Neurological: Negative for weakness and numbness.  All other systems reviewed and are negative.    Allergies  Shrimp and Tomato  Home Medications   Current Outpatient Rx  Name Route Sig Dispense Refill  . ASPIRIN-SALICYLAMIDE-CAFFEINE 325-95-16 MG PO TABS Oral Take 2 packets by mouth as needed.    Marland Kitchen HYDROCHLOROTHIAZIDE 25 MG PO TABS Oral Take 25 mg by mouth daily.    . IBUPROFEN 200 MG PO TABS Oral Take 800 mg by mouth as needed.    . INDOMETHACIN 50 MG PO CAPS Oral Take 50 mg by mouth 3 (three) times daily as needed. Gout Flare    . LANSOPRAZOLE 30 MG PO CPDR Oral Take 30 mg by mouth 2 (two) times daily.    Marland Kitchen NAPROXEN SODIUM 220 MG PO CAPS Oral Take 220 mg by mouth as needed.    Marland Kitchen PREDNISONE 10 MG PO TABS  Take 6 tablets day one, 5 tablets day two, 4 tablets day three, 3 tablets day four, 2 tablets day five, then 1 tablet day six 21 tablet 0  . VENLAFAXINE HCL ER 150 MG PO CP24 Oral Take 150 mg by mouth daily.      BP 147/80  Pulse 72  Temp 98.6 F (37 C) (Oral)  Resp 20  Ht 6\' 2"  (1.88 m)  Wt 190 lb (86.183 kg)  BMI 24.39 kg/m2  SpO2 100%  Physical Exam  Nursing note and vitals reviewed. Constitutional: He is oriented to person, place, and time. He appears well-developed and well-nourished. No distress.  HENT:  Head: Normocephalic and atraumatic.  Cardiovascular: Normal rate, regular rhythm, normal heart sounds and intact distal pulses.   Pulmonary/Chest: Effort normal and breath sounds normal.  Musculoskeletal: He exhibits tenderness.       Left foot: He exhibits tenderness and swelling. He exhibits normal range of motion, no bony tenderness, normal capillary refill, no crepitus, no deformity and no laceration.       Medial left foot, great toe and medial ankle are ttp, mild STS is present. Slight erythema is present.   ROM is preserved. No abrasions, bruising, or wounds to the foot or  ankle. DP pulse is brisk, sensation intact.  No calf pain or edema  Neurological: He is alert and oriented to person, place, and time. He exhibits normal muscle tone. Coordination normal.  Skin: Skin is warm and dry.    ED Course  Procedures (including critical care time)  Labs Reviewed - No data to display No results found.      MDM    Previous ED charts reviewed by me.  Pt seen here 10/20/11 by me for similar sx's.  Uric acid was slightly elevated at that time   Patient has history of gout. Clinical suspicion for cellulitis or DVT is  Low.  Patient has appointment with his PMD tomorrow   Prescribed: Percocet #15 Prednisone taper   Zamier Eggebrecht L. Rubyann [place], [place] 11/13/11 1722

## 2011-11-11 NOTE — ED Notes (Signed)
Pt reports that his "gout to left "foot is flaring up and is out of his percocet for pain, unable to see pmd until in the am.

## 2011-11-11 NOTE — ED Notes (Signed)
Pain lt foot especially at m-p joint of great toe, redness present.Pt says he has been taking goody powders without relief.

## 2011-11-18 NOTE — ED Provider Notes (Signed)
Medical screening examination/treatment/procedure(s) were performed by non-physician practitioner and as supervising physician I was immediately available for consultation/collaboration.   Deneene Tarver L Sequoyah Ramone, MD 11/18/11 1517 

## 2012-02-27 ENCOUNTER — Emergency Department (HOSPITAL_COMMUNITY): Payer: Medicare Other

## 2012-02-27 ENCOUNTER — Emergency Department (HOSPITAL_COMMUNITY)
Admission: EM | Admit: 2012-02-27 | Discharge: 2012-02-27 | Disposition: A | Discharge status: Home / Self Care | Payer: Medicare Other | Attending: Emergency Medicine | Admitting: Emergency Medicine

## 2012-02-27 ENCOUNTER — Encounter (HOSPITAL_COMMUNITY): Payer: Self-pay | Admitting: *Deleted

## 2012-02-27 DIAGNOSIS — Z8673 Personal history of transient ischemic attack (TIA), and cerebral infarction without residual deficits: Secondary | ICD-10-CM | POA: Insufficient documentation

## 2012-02-27 DIAGNOSIS — F319 Bipolar disorder, unspecified: Secondary | ICD-10-CM | POA: Insufficient documentation

## 2012-02-27 DIAGNOSIS — S20219A Contusion of unspecified front wall of thorax, initial encounter: Secondary | ICD-10-CM

## 2012-02-27 DIAGNOSIS — M549 Dorsalgia, unspecified: Secondary | ICD-10-CM

## 2012-02-27 DIAGNOSIS — G8929 Other chronic pain: Secondary | ICD-10-CM | POA: Insufficient documentation

## 2012-02-27 DIAGNOSIS — F209 Schizophrenia, unspecified: Secondary | ICD-10-CM | POA: Insufficient documentation

## 2012-02-27 DIAGNOSIS — Z87828 Personal history of other (healed) physical injury and trauma: Secondary | ICD-10-CM | POA: Insufficient documentation

## 2012-02-27 DIAGNOSIS — M129 Arthropathy, unspecified: Secondary | ICD-10-CM | POA: Insufficient documentation

## 2012-02-27 DIAGNOSIS — F17200 Nicotine dependence, unspecified, uncomplicated: Secondary | ICD-10-CM | POA: Insufficient documentation

## 2012-02-27 DIAGNOSIS — Z7982 Long term (current) use of aspirin: Secondary | ICD-10-CM | POA: Insufficient documentation

## 2012-02-27 DIAGNOSIS — R059 Cough, unspecified: Secondary | ICD-10-CM | POA: Insufficient documentation

## 2012-02-27 DIAGNOSIS — I10 Essential (primary) hypertension: Secondary | ICD-10-CM | POA: Insufficient documentation

## 2012-02-27 DIAGNOSIS — Z8711 Personal history of peptic ulcer disease: Secondary | ICD-10-CM | POA: Insufficient documentation

## 2012-02-27 DIAGNOSIS — R05 Cough: Secondary | ICD-10-CM | POA: Insufficient documentation

## 2012-02-27 DIAGNOSIS — Z981 Arthrodesis status: Secondary | ICD-10-CM | POA: Insufficient documentation

## 2012-02-27 DIAGNOSIS — J45909 Unspecified asthma, uncomplicated: Secondary | ICD-10-CM | POA: Insufficient documentation

## 2012-02-27 DIAGNOSIS — IMO0002 Reserved for concepts with insufficient information to code with codable children: Secondary | ICD-10-CM | POA: Insufficient documentation

## 2012-02-27 DIAGNOSIS — Z79899 Other long term (current) drug therapy: Secondary | ICD-10-CM | POA: Insufficient documentation

## 2012-02-27 DIAGNOSIS — R209 Unspecified disturbances of skin sensation: Secondary | ICD-10-CM | POA: Insufficient documentation

## 2012-02-27 DIAGNOSIS — M79609 Pain in unspecified limb: Secondary | ICD-10-CM | POA: Insufficient documentation

## 2012-02-27 MED ORDER — PREDNISONE 50 MG PO TABS
60.0000 mg | ORAL_TABLET | Freq: Once | ORAL | Status: AC
  Administered 2012-02-27: 60 mg via ORAL
  Filled 2012-02-27: qty 1

## 2012-02-27 MED ORDER — HYDROCODONE-ACETAMINOPHEN 5-325 MG PO TABS: 2.0000 | ORAL_TABLET | Freq: Four times a day (QID) | ORAL | Status: DC | PRN

## 2012-02-27 MED ORDER — HYDROCODONE-ACETAMINOPHEN 5-325 MG PO TABS
2.0000 | ORAL_TABLET | Freq: Once | ORAL | Status: AC
  Administered 2012-02-27: 2 via ORAL
  Filled 2012-02-27: qty 2

## 2012-02-27 MED ORDER — IBUPROFEN 600 MG PO TABS: 600.0000 mg | ORAL_TABLET | Freq: Three times a day (TID) | ORAL | Status: DC | PRN

## 2012-02-27 NOTE — ED Notes (Signed)
Discharge instructions reviewed with pt, questions answered. Pt verbalized understanding.  
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
## 2012-03-16 DIAGNOSIS — G5600 Carpal tunnel syndrome, unspecified upper limb: Secondary | ICD-10-CM | POA: Insufficient documentation

## 2013-09-14 ENCOUNTER — Encounter (HOSPITAL_COMMUNITY): Payer: Self-pay | Admitting: Emergency Medicine

## 2013-09-14 ENCOUNTER — Emergency Department (HOSPITAL_COMMUNITY): Payer: Medicare HMO

## 2013-09-14 ENCOUNTER — Emergency Department (HOSPITAL_COMMUNITY)
Admission: EM | Admit: 2013-09-14 | Discharge: 2013-09-15 | Disposition: A | Discharge status: Home / Self Care | Payer: Medicare HMO | Attending: Emergency Medicine | Admitting: Emergency Medicine

## 2013-09-14 DIAGNOSIS — F319 Bipolar disorder, unspecified: Secondary | ICD-10-CM | POA: Insufficient documentation

## 2013-09-14 DIAGNOSIS — Z8673 Personal history of transient ischemic attack (TIA), and cerebral infarction without residual deficits: Secondary | ICD-10-CM | POA: Insufficient documentation

## 2013-09-14 DIAGNOSIS — J45909 Unspecified asthma, uncomplicated: Secondary | ICD-10-CM | POA: Insufficient documentation

## 2013-09-14 DIAGNOSIS — I10 Essential (primary) hypertension: Secondary | ICD-10-CM | POA: Insufficient documentation

## 2013-09-14 DIAGNOSIS — S0181XA Laceration without foreign body of other part of head, initial encounter: Secondary | ICD-10-CM

## 2013-09-14 DIAGNOSIS — F17200 Nicotine dependence, unspecified, uncomplicated: Secondary | ICD-10-CM | POA: Diagnosis not present

## 2013-09-14 DIAGNOSIS — Z79899 Other long term (current) drug therapy: Secondary | ICD-10-CM | POA: Diagnosis not present

## 2013-09-14 DIAGNOSIS — S058X9A Other injuries of unspecified eye and orbit, initial encounter: Secondary | ICD-10-CM | POA: Diagnosis not present

## 2013-09-14 DIAGNOSIS — S0180XA Unspecified open wound of other part of head, initial encounter: Secondary | ICD-10-CM | POA: Insufficient documentation

## 2013-09-14 DIAGNOSIS — S0990XA Unspecified injury of head, initial encounter: Secondary | ICD-10-CM | POA: Insufficient documentation

## 2013-09-14 DIAGNOSIS — M129 Arthropathy, unspecified: Secondary | ICD-10-CM | POA: Diagnosis not present

## 2013-09-14 DIAGNOSIS — F1010 Alcohol abuse, uncomplicated: Secondary | ICD-10-CM | POA: Diagnosis not present

## 2013-09-14 DIAGNOSIS — Z8711 Personal history of peptic ulcer disease: Secondary | ICD-10-CM | POA: Diagnosis not present

## 2013-09-14 DIAGNOSIS — Z8659 Personal history of other mental and behavioral disorders: Secondary | ICD-10-CM | POA: Diagnosis not present

## 2013-09-14 MED ORDER — LIDOCAINE HCL (PF) 1 % IJ SOLN
30.0000 mL | Freq: Once | INTRAMUSCULAR | Status: AC
  Administered 2013-09-15: 30 mL
  Filled 2013-09-14: qty 30
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
## 2013-09-15 MED ORDER — LIDOCAINE HCL (PF) 1 % IJ SOLN
INTRAMUSCULAR | Status: AC
  Filled 2013-09-15: qty 5

## 2013-09-15 NOTE — ED Provider Notes (Signed)
LACERATION REPAIR Performed by: Evalee Jefferson Authorized by: Evalee Jefferson Consent: Verbal consent obtained. Risks and benefits: risks, benefits and alternatives were discussed Consent given by: patient Patient identity confirmed: provided demographic data Prepped and Draped in normal sterile fashion Wound explored  Laceration Location: right eyebrow, 2 near linear lacerations, 2 cm and 1 cm length,  subcutaneous  Laceration Length: 2 cm and 1 cm, resp  No Foreign Bodies seen or palpated  Anesthesia: local infiltration  Local anesthetic: lidocaine 1% without epinephrine  Anesthetic total: 2 ml  Irrigation method: syringe Amount of cleaning: standard  Skin closure: ethilon 6-0  Number of sutures: 9  Technique: simple interrupted  Patient tolerance: Patient tolerated the procedure well with no immediate complications.   Evalee Jefferson, PA-C 09/15/13 [phone]
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
## 2013-09-15 NOTE — Discharge Instructions (Signed)
Facial Laceration Follow up for suture removal in 1 week. Return to the ED if you develop new or worsening symptoms.  A facial laceration is a cut on the face. These injuries can be painful and cause bleeding. Lacerations usually heal quickly, but they need special care to reduce scarring. DIAGNOSIS  Your health care provider will take a medical history, ask for details about how the injury occurred, and examine the wound to determine how deep the cut is. TREATMENT  Some facial lacerations may not require closure. Others may not be able to be closed because of an increased risk of infection. The risk of infection and the chance for successful closure will depend on various factors, including the amount of time since the injury occurred. The wound may be cleaned to help prevent infection. If closure is appropriate, pain medicines may be given if needed. Your health care provider will use stitches (sutures), wound glue (adhesive), or skin adhesive strips to repair the laceration. These tools bring the skin edges together to allow for faster healing and a better cosmetic outcome. If needed, you may also be given a tetanus shot. HOME CARE INSTRUCTIONS  Only take over-the-counter or prescription medicines as directed by your health care provider.  Follow your health care provider's instructions for wound care. These instructions will vary depending on the technique used for closing the wound. For Sutures:  Keep the wound clean and dry.   If you were given a bandage (dressing), you should change it at least once a day. Also change the dressing if it becomes wet or dirty, or as directed by your health care provider.   Wash the wound with soap and water 2 times a day. Rinse the wound off with water to remove all soap. Pat the wound dry with a clean towel.   After cleaning, apply a thin layer of the antibiotic ointment recommended by your health care provider. This will help prevent infection and keep  the dressing from sticking.   You may shower as usual after the first 24 hours. Do not soak the wound in water until the sutures are removed.   Get your sutures removed as directed by your health care provider. With facial lacerations, sutures should usually be taken out after 4-5 days to avoid stitch marks.   Wait a few days after your sutures are removed before applying any makeup. For Skin Adhesive Strips:  Keep the wound clean and dry.   Do not get the skin adhesive strips wet. You may bathe carefully, using caution to keep the wound dry.   If the wound gets wet, pat it dry with a clean towel.   Skin adhesive strips will fall off on their own. You may trim the strips as the wound heals. Do not remove skin adhesive strips that are still stuck to the wound. They will fall off in time.  For Wound Adhesive:  You may briefly wet your wound in the shower or bath. Do not soak or scrub the wound. Do not swim. Avoid periods of heavy sweating until the skin adhesive has fallen off on its own. After showering or bathing, gently pat the wound dry with a clean towel.   Do not apply liquid medicine, cream medicine, ointment medicine, or makeup to your wound while the skin adhesive is in place. This may loosen the film before your wound is healed.   If a dressing is placed over the wound, be careful not to apply tape directly over the  skin adhesive. This may cause the adhesive to be pulled off before the wound is healed.   Avoid prolonged exposure to sunlight or tanning lamps while the skin adhesive is in place.  The skin adhesive will usually remain in place for 5-10 days, then naturally fall off the skin. Do not pick at the adhesive film.  After Healing: Once the wound has healed, cover the wound with sunscreen during the day for 1 full year. This can help minimize scarring. Exposure to ultraviolet light in the first year will darken the scar. It can take 1-2 years for the scar to lose  its redness and to heal completely.  SEEK IMMEDIATE MEDICAL CARE IF:  You have redness, pain, or swelling around the wound.   You see ayellowish-white fluid (pus) coming from the wound.   You have chills or a fever.  MAKE SURE YOU:  Understand these instructions.  Will watch your condition.  Will get help right away if you are not doing well or get worse. Document Released: 02/21/2004 Document Revised: 11/03/2012 Document Reviewed: 08/26/2012 [facility] Patient Information 2015 [place], [place]. This information is not intended to replace advice given to you by your health care provider. Make sure you discuss any questions you have with your health care provider.

## 2014-01-30 ENCOUNTER — Emergency Department (HOSPITAL_COMMUNITY)
Admission: EM | Admit: 2014-01-30 | Discharge: 2014-01-31 | Disposition: A | Discharge status: Home / Self Care | Payer: Medicare HMO | Attending: Emergency Medicine | Admitting: Emergency Medicine

## 2014-01-30 DIAGNOSIS — F319 Bipolar disorder, unspecified: Secondary | ICD-10-CM | POA: Insufficient documentation

## 2014-01-30 DIAGNOSIS — S40012A Contusion of left shoulder, initial encounter: Secondary | ICD-10-CM

## 2014-01-30 DIAGNOSIS — Y92410 Unspecified street and highway as the place of occurrence of the external cause: Secondary | ICD-10-CM | POA: Diagnosis not present

## 2014-01-30 DIAGNOSIS — X58XXXA Exposure to other specified factors, initial encounter: Secondary | ICD-10-CM | POA: Insufficient documentation

## 2014-01-30 DIAGNOSIS — Z79899 Other long term (current) drug therapy: Secondary | ICD-10-CM | POA: Insufficient documentation

## 2014-01-30 DIAGNOSIS — T1490XA Injury, unspecified, initial encounter: Secondary | ICD-10-CM

## 2014-01-30 DIAGNOSIS — M1990 Unspecified osteoarthritis, unspecified site: Secondary | ICD-10-CM | POA: Insufficient documentation

## 2014-01-30 DIAGNOSIS — S79912A Unspecified injury of left hip, initial encounter: Secondary | ICD-10-CM | POA: Diagnosis not present

## 2014-01-30 DIAGNOSIS — T148XXA Other injury of unspecified body region, initial encounter: Secondary | ICD-10-CM

## 2014-01-30 DIAGNOSIS — Y9389 Activity, other specified: Secondary | ICD-10-CM | POA: Insufficient documentation

## 2014-01-30 DIAGNOSIS — Y998 Other external cause status: Secondary | ICD-10-CM | POA: Diagnosis not present

## 2014-01-30 DIAGNOSIS — Z720 Tobacco use: Secondary | ICD-10-CM | POA: Insufficient documentation

## 2014-01-30 DIAGNOSIS — S80212A Abrasion, left knee, initial encounter: Secondary | ICD-10-CM | POA: Diagnosis not present

## 2014-01-30 DIAGNOSIS — S40212A Abrasion of left shoulder, initial encounter: Secondary | ICD-10-CM | POA: Diagnosis not present

## 2014-01-30 DIAGNOSIS — Z8673 Personal history of transient ischemic attack (TIA), and cerebral infarction without residual deficits: Secondary | ICD-10-CM | POA: Diagnosis not present

## 2014-01-30 DIAGNOSIS — R52 Pain, unspecified: Secondary | ICD-10-CM

## 2014-01-30 DIAGNOSIS — J45909 Unspecified asthma, uncomplicated: Secondary | ICD-10-CM | POA: Diagnosis not present

## 2014-01-30 DIAGNOSIS — F10120 Alcohol abuse with intoxication, uncomplicated: Secondary | ICD-10-CM | POA: Diagnosis not present

## 2014-01-30 DIAGNOSIS — Z8711 Personal history of peptic ulcer disease: Secondary | ICD-10-CM | POA: Diagnosis not present

## 2014-01-30 DIAGNOSIS — I10 Essential (primary) hypertension: Secondary | ICD-10-CM | POA: Diagnosis not present

## 2014-01-30 DIAGNOSIS — R4182 Altered mental status, unspecified: Secondary | ICD-10-CM | POA: Diagnosis present

## 2014-01-30 DIAGNOSIS — F10920 Alcohol use, unspecified with intoxication, uncomplicated: Secondary | ICD-10-CM
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
## 2014-01-31 ENCOUNTER — Encounter (HOSPITAL_COMMUNITY): Payer: Self-pay | Admitting: *Deleted

## 2014-01-31 ENCOUNTER — Emergency Department (HOSPITAL_COMMUNITY): Payer: Medicare HMO

## 2014-01-31 LAB — CBG MONITORING, ED: GLUCOSE-CAPILLARY: 110 mg/dL — AB (ref 70–99)

## 2014-01-31 LAB — DIFFERENTIAL
Basophils Absolute: 0 10*3/uL (ref 0.0–0.1)
Basophils Relative: 0 % (ref 0–1)
EOS PCT: 3 % (ref 0–5)
Eosinophils Absolute: 0.2 10*3/uL (ref 0.0–0.7)
LYMPHS PCT: 34 % (ref 12–46)
Lymphs Abs: 2.1 10*3/uL (ref 0.7–4.0)
MONO ABS: 0.5 10*3/uL (ref 0.1–1.0)
Monocytes Relative: 9 % (ref 3–12)
Neutro Abs: 3.3 10*3/uL (ref 1.7–7.7)
Neutrophils Relative %: 54 % (ref 43–77)

## 2014-01-31 LAB — CBC
HEMATOCRIT: 47.1 % (ref 39.0–52.0)
HEMOGLOBIN: 17.1 g/dL — AB (ref 13.0–17.0)
MCH: 33.5 pg (ref 26.0–34.0)
MCHC: 36.3 g/dL — ABNORMAL HIGH (ref 30.0–36.0)
MCV: 92.4 fL (ref 78.0–100.0)
Platelets: 164 10*3/uL (ref 150–400)
RBC: 5.1 MIL/uL (ref 4.22–5.81)
RDW: 12.2 % (ref 11.5–15.5)
WBC: 6.3 10*3/uL (ref 4.0–10.5)

## 2014-01-31 LAB — ETHANOL: ALCOHOL ETHYL (B): 188 mg/dL — AB (ref 0–9)

## 2014-01-31 LAB — URINALYSIS, ROUTINE W REFLEX MICROSCOPIC
Bilirubin Urine: NEGATIVE
Glucose, UA: NEGATIVE mg/dL
Hgb urine dipstick: NEGATIVE
Ketones, ur: NEGATIVE mg/dL
LEUKOCYTES UA: NEGATIVE
NITRITE: NEGATIVE
PH: 6 (ref 5.0–8.0)
Protein, ur: NEGATIVE mg/dL
Urobilinogen, UA: 0.2 mg/dL (ref 0.0–1.0)

## 2014-01-31 LAB — COMPREHENSIVE METABOLIC PANEL
ALBUMIN: 4.5 g/dL (ref 3.5–5.2)
ALT: 35 U/L (ref 0–53)
AST: 36 U/L (ref 0–37)
Alkaline Phosphatase: 87 U/L (ref 39–117)
Anion gap: 12 (ref 5–15)
BUN: 9 mg/dL (ref 6–23)
CALCIUM: 9.1 mg/dL (ref 8.4–10.5)
CO2: 22 mmol/L (ref 19–32)
Chloride: 106 mEq/L (ref 96–112)
Creatinine, Ser: 0.77 mg/dL (ref 0.50–1.35)
GFR calc Af Amer: >90 mL/min (ref >90)
GFR calc non Af Amer: >90 mL/min (ref >90)
GLUCOSE: 119 mg/dL — AB (ref 70–99)
Potassium: 3.4 mmol/L — ABNORMAL LOW (ref 3.5–5.1)
SODIUM: 140 mmol/L (ref 135–145)
TOTAL PROTEIN: 8.1 g/dL (ref 6.0–8.3)
Total Bilirubin: 0.7 mg/dL (ref 0.3–1.2)

## 2014-01-31 MED ORDER — IOHEXOL 300 MG/ML  SOLN
100.0000 mL | Freq: Once | INTRAMUSCULAR | Status: AC | PRN
  Administered 2014-01-31: 100 mL via INTRAVENOUS

## 2014-01-31 MED ORDER — SODIUM CHLORIDE 0.9 % IJ SOLN
INTRAMUSCULAR | Status: AC
  Filled 2014-01-31: qty 500

## 2014-01-31 MED ORDER — SODIUM CHLORIDE 0.9 % IJ SOLN
INTRAMUSCULAR | Status: AC
  Filled 2014-01-31: qty 15

## 2014-01-31 MED ORDER — DIPHENHYDRAMINE HCL 50 MG/ML IJ SOLN: 25.0000 mg | Freq: Once | INTRAMUSCULAR | Status: DC
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
## 2014-01-31 NOTE — ED Notes (Signed)
Dr.glick in room with patient. [place] at side
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
## 2014-01-31 NOTE — ED Notes (Signed)
Up and dressed self in blue paper scrubs his own shoes and sweatshirt. Easily ambulatory with steady gait.
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
## 2014-01-31 NOTE — Discharge Instructions (Signed)
Take acetaminophen or ibuprofen as needed for pain.   Abrasion An abrasion is a cut or scrape of the skin. Abrasions do not extend through all layers of the skin and most heal within 10 days. It is important to care for your abrasion properly to prevent infection. CAUSES  Most abrasions are caused by falling on, or gliding across, the ground or other surface. When your skin rubs on something, the outer and inner layer of skin rubs off, causing an abrasion. DIAGNOSIS  Your caregiver will be able to diagnose an abrasion during a physical exam.  TREATMENT  Your treatment depends on how large and deep the abrasion is. Generally, your abrasion will be cleaned with water and a mild soap to remove any dirt or debris. An antibiotic ointment may be put over the abrasion to prevent an infection. A bandage (dressing) may be wrapped around the abrasion to keep it from getting dirty.  You may need a tetanus shot if:  You cannot remember when you had your last tetanus shot.  You have never had a tetanus shot.  The injury broke your skin. If you get a tetanus shot, your arm may swell, get red, and feel warm to the touch. This is common and not a problem. If you need a tetanus shot and you choose not to have one, there is a rare chance of getting tetanus. Sickness from tetanus can be serious.  HOME CARE INSTRUCTIONS   If a dressing was applied, change it at least once a day or as directed by your caregiver. If the bandage sticks, soak it off with warm water.   Wash the area with water and a mild soap to remove all the ointment 2 times a day. Rinse off the soap and pat the area dry with a clean towel.   Reapply any ointment as directed by your caregiver. This will help prevent infection and keep the bandage from sticking. Use gauze over the wound and under the dressing to help keep the bandage from sticking.   Change your dressing right away if it becomes wet or dirty.   Only take over-the-counter or  prescription medicines for pain, discomfort, or fever as directed by your caregiver.   Follow up with your caregiver within 24-48 hours for a wound check, or as directed. If you were not given a wound-check appointment, look closely at your abrasion for redness, swelling, or pus. These are signs of infection. SEEK IMMEDIATE MEDICAL CARE IF:   You have increasing pain in the wound.   You have redness, swelling, or tenderness around the wound.   You have pus coming from the wound.   You have a fever or persistent symptoms for more than 2-3 days.  You have a fever and your symptoms suddenly get worse.  You have a bad smell coming from the wound or dressing.  MAKE SURE YOU:   Understand these instructions.  Will watch your condition.  Will get help right away if you are not doing well or get worse. Document Released: 10/23/2004 Document Revised: 12/31/2011 Document Reviewed: 12/17/2010 [facility] Patient Information 2015 [place], [place]. This information is not intended to replace advice given to you by your health care provider. Make sure you discuss any questions you have with your health care provider.   Contusion A contusion is a deep bruise. Contusions are the result of an injury that caused bleeding under the skin. The contusion may turn blue, purple, or yellow. Minor injuries will give you a  painless contusion, but more severe contusions may stay painful and swollen for a few weeks.  CAUSES  A contusion is usually caused by a blow, trauma, or direct force to an area of the body. SYMPTOMS   Swelling and redness of the injured area.  Bruising of the injured area.  Tenderness and soreness of the injured area.  Pain. DIAGNOSIS  The diagnosis can be made by taking a history and physical exam. An X-ray, CT scan, or MRI may be needed to determine if there were any associated injuries, such as fractures. TREATMENT  Specific treatment will depend on what area of the body was  injured. In general, the best treatment for a contusion is resting, icing, elevating, and applying cold compresses to the injured area. Over-the-counter medicines may also be recommended for pain control. Ask your caregiver what the best treatment is for your contusion. HOME CARE INSTRUCTIONS   Put ice on the injured area.  Put ice in a plastic bag.  Place a towel between your skin and the bag.  Leave the ice on for 15-20 minutes, 3-4 times a day, or as directed by your health care provider.  Only take over-the-counter or prescription medicines for pain, discomfort, or fever as directed by your caregiver. Your caregiver may recommend avoiding anti-inflammatory medicines (aspirin, ibuprofen, and naproxen) for 48 hours because these medicines may increase bruising.  Rest the injured area.  If possible, elevate the injured area to reduce swelling. SEEK IMMEDIATE MEDICAL CARE IF:   You have increased bruising or swelling.  You have pain that is getting worse.  Your swelling or pain is not relieved with medicines. MAKE SURE YOU:   Understand these instructions.  Will watch your condition.  Will get help right away if you are not doing well or get worse. Document Released: 10/23/2004 Document Revised: 01/18/2013 Document Reviewed: 11/18/2010 [facility] Patient Information 2015 [place], [place]. This information is not intended to replace advice given to you by your health care provider. Make sure you discuss any questions you have with your health care provider.  Alcohol Intoxication Alcohol intoxication occurs when the amount of alcohol that a person has consumed impairs his or her ability to mentally and physically function. Alcohol directly impairs the normal chemical activity of the brain. Drinking large amounts of alcohol can lead to changes in mental function and behavior, and it can cause many physical effects that can be harmful.  Alcohol intoxication can range in severity from mild  to very severe. Various factors can affect the level of intoxication that occurs, such as the person's age, gender, weight, frequency of alcohol consumption, and the presence of other medical conditions (such as diabetes, seizures, or heart conditions). Dangerous levels of alcohol intoxication may occur when people drink large amounts of alcohol in a short period (binge drinking). Alcohol can also be especially dangerous when combined with certain prescription medicines or "recreational" drugs. SIGNS AND SYMPTOMS Some common signs and symptoms of mild alcohol intoxication include:  Loss of coordination.  Changes in mood and behavior.  Impaired judgment.  Slurred speech. As alcohol intoxication progresses to more severe levels, other signs and symptoms will appear. These may include:  Vomiting.  Confusion and impaired memory.  Slowed breathing.  Seizures.  Loss of consciousness. DIAGNOSIS  Your health care provider will take a medical history and perform a physical exam. You will be asked about the amount and type of alcohol you have consumed. Blood tests will be done to measure the concentration of  alcohol in your blood. In many places, your blood alcohol level must be lower than 80 mg/dL (0.08%) to legally drive. However, many dangerous effects of alcohol can occur at much lower levels.  TREATMENT  People with alcohol intoxication often do not require treatment. Most of the effects of alcohol intoxication are temporary, and they go away as the alcohol naturally leaves the body. Your health care provider will monitor your condition until you are stable enough to go home. Fluids are sometimes given through an IV access tube to help prevent dehydration.  HOME CARE INSTRUCTIONS  Do not drive after drinking alcohol.  Stay hydrated. Drink enough water and fluids to keep your urine clear or pale yellow. Avoid caffeine.   Only take over-the-counter or prescription medicines as directed by  your health care provider.  SEEK MEDICAL CARE IF:   You have persistent vomiting.   You do not feel better after a few days.  You have frequent alcohol intoxication. Your health care provider can help determine if you should see a substance use treatment counselor. SEEK IMMEDIATE MEDICAL CARE IF:   You become shaky or tremble when you try to stop drinking.   You shake uncontrollably (seizure).   You throw up (vomit) blood. This may be bright red or may look like black coffee grounds.   You have blood in your stool. This may be bright red or may appear as a black, tarry, bad smelling stool.   You become lightheaded or faint.  MAKE SURE YOU:   Understand these instructions.  Will watch your condition.  Will get help right away if you are not doing well or get worse. Document Released: 10/23/2004 Document Revised: 09/15/2012 Document Reviewed: 06/18/2012 [facility] Patient Information 2015 [place], [place]. This information is not intended to replace advice given to you by your health care provider. Make sure you discuss any questions you have with your health care provider.

## 2014-01-31 NOTE — ED Notes (Signed)
Involuntary papers competed via dr. Roxanne Mins.

## 2014-01-31 NOTE — ED Notes (Signed)
Much calmer, coherent.re eval by Dr Roxanne Mins

## 2014-02-13 ENCOUNTER — Encounter (HOSPITAL_COMMUNITY): Payer: Self-pay

## 2014-02-13 ENCOUNTER — Inpatient Hospital Stay (HOSPITAL_COMMUNITY)
Admission: EM | Admit: 2014-02-13 | Discharge: 2014-02-14 | DRG: 917 | Disposition: A | Discharge status: Other Acute Inpatient Hospital | Payer: Medicare HMO | Attending: Internal Medicine | Admitting: Internal Medicine

## 2014-02-13 DIAGNOSIS — T43212A Poisoning by selective serotonin and norepinephrine reuptake inhibitors, intentional self-harm, initial encounter: Secondary | ICD-10-CM | POA: Diagnosis present

## 2014-02-13 DIAGNOSIS — G92 Toxic encephalopathy: Secondary | ICD-10-CM | POA: Diagnosis present

## 2014-02-13 DIAGNOSIS — T50902A Poisoning by unspecified drugs, medicaments and biological substances, intentional self-harm, initial encounter: Secondary | ICD-10-CM

## 2014-02-13 DIAGNOSIS — Y903 Blood alcohol level of 60-79 mg/100 ml: Secondary | ICD-10-CM | POA: Diagnosis present

## 2014-02-13 DIAGNOSIS — F209 Schizophrenia, unspecified: Secondary | ICD-10-CM | POA: Diagnosis present

## 2014-02-13 DIAGNOSIS — F1010 Alcohol abuse, uncomplicated: Secondary | ICD-10-CM | POA: Diagnosis present

## 2014-02-13 DIAGNOSIS — Z8673 Personal history of transient ischemic attack (TIA), and cerebral infarction without residual deficits: Secondary | ICD-10-CM

## 2014-02-13 DIAGNOSIS — Z609 Problem related to social environment, unspecified: Secondary | ICD-10-CM | POA: Diagnosis present

## 2014-02-13 DIAGNOSIS — R21 Rash and other nonspecific skin eruption: Secondary | ICD-10-CM | POA: Diagnosis present

## 2014-02-13 DIAGNOSIS — T481X2A Poisoning by skeletal muscle relaxants [neuromuscular blocking agents], intentional self-harm, initial encounter: Secondary | ICD-10-CM | POA: Diagnosis present

## 2014-02-13 DIAGNOSIS — F332 Major depressive disorder, recurrent severe without psychotic features: Secondary | ICD-10-CM | POA: Diagnosis not present

## 2014-02-13 DIAGNOSIS — F319 Bipolar disorder, unspecified: Secondary | ICD-10-CM | POA: Diagnosis present

## 2014-02-13 DIAGNOSIS — I10 Essential (primary) hypertension: Secondary | ICD-10-CM | POA: Diagnosis not present

## 2014-02-13 DIAGNOSIS — E876 Hypokalemia: Secondary | ICD-10-CM | POA: Diagnosis present

## 2014-02-13 DIAGNOSIS — R4182 Altered mental status, unspecified: Secondary | ICD-10-CM | POA: Diagnosis present

## 2014-02-13 DIAGNOSIS — T50901A Poisoning by unspecified drugs, medicaments and biological substances, accidental (unintentional), initial encounter: Secondary | ICD-10-CM | POA: Diagnosis present

## 2014-02-13 DIAGNOSIS — F17210 Nicotine dependence, cigarettes, uncomplicated: Secondary | ICD-10-CM | POA: Diagnosis present

## 2014-02-13 DIAGNOSIS — E1159 Type 2 diabetes mellitus with other circulatory complications: Secondary | ICD-10-CM | POA: Diagnosis present

## 2014-02-13 DIAGNOSIS — J45909 Unspecified asthma, uncomplicated: Secondary | ICD-10-CM | POA: Diagnosis present

## 2014-02-13 DIAGNOSIS — M1990 Unspecified osteoarthritis, unspecified site: Secondary | ICD-10-CM | POA: Diagnosis present

## 2014-02-13 DIAGNOSIS — L0231 Cutaneous abscess of buttock: Secondary | ICD-10-CM | POA: Diagnosis not present

## 2014-02-13 DIAGNOSIS — R45851 Suicidal ideations: Secondary | ICD-10-CM

## 2014-02-13 DIAGNOSIS — T1491XA Suicide attempt, initial encounter: Secondary | ICD-10-CM

## 2014-02-13 LAB — CBC WITH DIFFERENTIAL/PLATELET
Basophils Absolute: 0 10*3/uL (ref 0.0–0.1)
Basophils Relative: 0 % (ref 0–1)
EOS ABS: 0.3 10*3/uL (ref 0.0–0.7)
Eosinophils Relative: 3 % (ref 0–5)
HCT: 46.6 % (ref 39.0–52.0)
Hemoglobin: 17 g/dL (ref 13.0–17.0)
LYMPHS PCT: 36 % (ref 12–46)
Lymphs Abs: 2.7 10*3/uL (ref 0.7–4.0)
MCH: 33.8 pg (ref 26.0–34.0)
MCHC: 36.5 g/dL — AB (ref 30.0–36.0)
MCV: 92.6 fL (ref 78.0–100.0)
Monocytes Absolute: 0.6 10*3/uL (ref 0.1–1.0)
Monocytes Relative: 8 % (ref 3–12)
NEUTROS PCT: 53 % (ref 43–77)
Neutro Abs: 4 10*3/uL (ref 1.7–7.7)
PLATELETS: 182 10*3/uL (ref 150–400)
RBC: 5.03 MIL/uL (ref 4.22–5.81)
RDW: 12.5 % (ref 11.5–15.5)
WBC: 7.6 10*3/uL (ref 4.0–10.5)

## 2014-02-13 LAB — COMPREHENSIVE METABOLIC PANEL
ALT: 29 U/L (ref 0–53)
AST: 36 U/L (ref 0–37)
Albumin: 4.4 g/dL (ref 3.5–5.2)
Alkaline Phosphatase: 94 U/L (ref 39–117)
Anion gap: 14 (ref 5–15)
BUN: 8 mg/dL (ref 6–23)
CO2: 24 mmol/L (ref 19–32)
Calcium: 9.1 mg/dL (ref 8.4–10.5)
Chloride: 98 mEq/L (ref 96–112)
Creatinine, Ser: 0.8 mg/dL (ref 0.50–1.35)
GFR calc Af Amer: >90 mL/min (ref >90)
Glucose, Bld: 100 mg/dL — ABNORMAL HIGH (ref 70–99)
POTASSIUM: 3 mmol/L — AB (ref 3.5–5.1)
SODIUM: 136 mmol/L (ref 135–145)
Total Bilirubin: 0.8 mg/dL (ref 0.3–1.2)
Total Protein: 7.6 g/dL (ref 6.0–8.3)

## 2014-02-13 LAB — MAGNESIUM: MAGNESIUM: 2.3 mg/dL (ref 1.5–2.5)

## 2014-02-13 LAB — SALICYLATE LEVEL

## 2014-02-13 LAB — ACETAMINOPHEN LEVEL

## 2014-02-13 LAB — ETHANOL: ALCOHOL ETHYL (B): 79 mg/dL — AB (ref 0–9)

## 2014-02-13 MED ORDER — LORAZEPAM 2 MG/ML IJ SOLN
1.0000 mg | Freq: Once | INTRAMUSCULAR | Status: AC
  Administered 2014-02-13: 1 mg via INTRAVENOUS
  Filled 2014-02-13: qty 1

## 2014-02-13 NOTE — H&P (Signed)
PCP:   Celedonio Savage, MD   Chief Complaint:  Altered mental status with suicide attempt  HPI:  49 year old male who  has a past medical history of Hypertension; Arthritis; Bipolar 1 disorder; Schizophrenia; Depression; Stroke; Asthma; and Peptic ulcer. Today was brought to the ED by police, after patient called 911 and told them that he has taken 30 Flexeril and 15 trazodone in an effort to kill himself. Patient was given Narcan by the EMS and he did perk up for a while, but at this time patient is to stuporous to answer any questions. No review of systems obtainable at this time As per the ED physician, patient tried to commit suicide also admits to drinking 6 pack beer. He took pills as he did not want to live anymore.  Allergies:   Allergies  Allergen Reactions  . Shrimp [Shellfish Allergy] Other (See Comments)    Triggers gout flare  . Orange Fruit [Citrus]   . Tomato Rash and Other (See Comments)    REACTION: Boil-like spots on skin      Past Medical History  Diagnosis Date  . Hypertension   . Arthritis   . Bipolar 1 disorder   . Schizophrenia   . Depression   . Stroke   . Asthma   . Peptic ulcer     Past Surgical History  Procedure Laterality Date  . Cervical fusion    . Bil foot surgery      Prior to Admission medications   Medication Sig Start Date End Date Taking? Authorizing Provider  allopurinol (ZYLOPRIM) 300 MG tablet Take 300 mg by mouth daily. 09/09/13  Yes Historical Provider, MD  cyclobenzaprine (FLEXERIL) 10 MG tablet Take 5-10 mg by mouth 3 (three) times daily as needed for muscle spasms.   Yes Historical Provider, MD  gabapentin (NEURONTIN) 300 MG capsule Take 300 mg by mouth 3 (three) times daily.   Yes Historical Provider, MD  hydrochlorothiazide (HYDRODIURIL) 25 MG tablet Take 25 mg by mouth daily.   Yes Historical Provider, MD  mirtazapine (REMERON) 15 MG tablet Take 7.5 mg by mouth at bedtime.   Yes Historical Provider, MD  pantoprazole  (PROTONIX) 40 MG tablet Take 40 mg by mouth daily. 09/09/13  Yes Historical Provider, MD  traZODone (DESYREL) 100 MG tablet Take 100 mg by mouth at bedtime.   Yes Historical Provider, MD  venlafaxine (EFFEXOR-XR) 150 MG 24 hr capsule Take 150 mg by mouth daily.   Yes Historical Provider, MD  Aspirin-Salicylamide-Caffeine (BC HEADACHE) 325-95-16 MG TABS Take 1 packet by mouth every 6 (six) hours as needed (for pain).    Historical Provider, MD    Social History:  reports that he has been smoking Cigarettes.  He has been smoking about 1.00 pack per day. He does not have any smokeless tobacco history on file. He reports that he drinks alcohol. He reports that he does not use illicit drugs.    All the positives are listed in BOLD  Review of Systems:  Unobtainable at this time due to patient's altered mental status   Physical Exam: Blood pressure 162/102, pulse 98, temperature 97.7 F (36.5 C), temperature source Oral, resp. rate 18, height 6\' 2"  (1.88 m), weight 91.173 kg (201 lb), SpO2 96 %. Constitutional:   Patient is a well-developed and well-nourished male, somnolent unable to cooperate with examination. Head: Normocephalic and atraumatic Eyes: PERRL,  Neck: Supple, No Thyromegaly Cardiovascular: RRR, S1 normal, S2 normal Pulmonary/Chest: CTAB, no wheezes, rales, or rhonchi Abdominal:  Soft. Non-tender, non-distended, bowel sounds are normal, no masses, organomegaly, or guarding present.  Neurological: Stuporous , moving all extremities.  Extremities : No Cyanosis, Clubbing or Edema  Labs on Admission:  Basic Metabolic Panel:  Recent Labs Lab 02/13/14 2043  NA 136  K 3.0*  CL 98  CO2 24  GLUCOSE 100*  BUN 8  CREATININE 0.80  CALCIUM 9.1  MG 2.3   Liver Function Tests:  Recent Labs Lab 02/13/14 2043  AST 36  ALT 29  ALKPHOS 94  BILITOT 0.8  PROT 7.6  ALBUMIN 4.4   No results for input(s): LIPASE, AMYLASE in the last 168 hours. No results for input(s): AMMONIA  in the last 168 hours. CBC:  Recent Labs Lab 02/13/14 2043  WBC 7.6  NEUTROABS 4.0  HGB 17.0  HCT 46.6  MCV 92.6  PLT 182   Cardiac Enzymes: No results for input(s): CKTOTAL, CKMB, CKMBINDEX, TROPONINI in the last 168 hours.  BNP (last 3 results) No results for input(s): PROBNP in the last 8760 hours. CBG: No results for input(s): GLUCAP in the last 168 hours.  Radiological Exams on Admission: No results found.  EKG: Independently reviewed. EKG shows prolonged QTc interval 577   Assessment/Plan Active Problems:   Overdose   Altered mental state   HTN (hypertension)   prolonged QTC  Altered mental status Secondary to drug overdose in an effort for suicide attempt. Will admit the patient to stepdown unit and monitor closely. Start IV normal saline at 125 MR per hour. Possible to was contacted by the ED physician, who recommended IV fluids and keep a close watch on the QRS interval. No specific antidote recommended at this time.  Prolonged QTC Patient has prolonged QTC 577, will check serum magnesium level. Potassium is 3.0 we'll replace IV potassium. Check BMP in a.m.  Suicidal attempt Patient will be committed involuntarily, consult psych in a.m. Suicide precautions, sitter at bedside  Will hold all the by mouth medications which the patient has been taking at home at this time.  Code status: Presumed full code  Family discussion: No family at bedside   Time Spent on Admission: 40 minutes  [facility] Pager: [phone] 02/13/2014, 10:47 PM  If 7PM-7AM, please contact night-coverage  [email or web address]  Password [email or web address]

## 2014-02-13 NOTE — ED Notes (Signed)
Pt brought in by ems and escorted by [employer] Officer.  Pt admitted to taking approx 30 flexeril and 15  "some other pills I can't remember the name"   Per ems, pt given 2 mg of narcan for altered loc, and patient awoke and was able to talk to them.  Pt also admits to drinking alcohol, but denies any narcotic abuse.

## 2014-02-13 NOTE — ED Provider Notes (Signed)
CSN: [id number]     Arrival date & time 02/13/14  1953 History  This chart was scribed for [employer]. Alvino Chapel, MD by Molli Posey, ED Scribe. This patient was seen in room APA15/APA15 and the patient's care was started 8:06 PM.    Chief Complaint  Patient presents with  . Drug Overdose   LEVEL 5 CAVEAT - ALTERED MENTAL STATUS   The history is provided by the patient. No language interpreter was used.   HPI Comments: Frank Moses is a 49 y.o. male who presents to the Emergency Department for a drug overdose that occurred PTA. Per nursing, pt admitted to taking approximately 30 flexeril and 15 "some other pills I can't remember the name".Per ems, pt given 2 mg of narcan for altered loc, and patient awoke and was able to talk to them. Pt also admits to drinking 6 miller lites but denies any narcotic abuse. He states he took the pills because he did "not want to live anymore".   Past Medical History  Diagnosis Date  . Hypertension   . Arthritis   . Bipolar 1 disorder   . Schizophrenia   . Depression   . Stroke   . Asthma   . Peptic ulcer    Past Surgical History  Procedure Laterality Date  . Cervical fusion    . Bil foot surgery     History reviewed. No pertinent family history. History  Substance Use Topics  . Smoking status: Current Every Day Smoker -- 1.00 packs/day    Types: Cigarettes  . Smokeless tobacco: Not on file  . Alcohol Use: Yes    Review of Systems  Unable to perform ROS: Mental status change    Allergies  Shrimp; Orange fruit; and Tomato  Home Medications   Prior to Admission medications   Medication Sig Start Date End Date Taking? Authorizing Provider  allopurinol (ZYLOPRIM) 300 MG tablet Take 300 mg by mouth daily. 09/09/13  Yes Historical Provider, MD  Aspirin-Salicylamide-Caffeine (BC HEADACHE) 325-95-16 MG TABS Take 1 packet by mouth every 6 (six) hours as needed (for pain).   Yes Historical Provider, MD  gabapentin (NEURONTIN) 300 MG  capsule Take 300 mg by mouth 3 (three) times daily.   Yes Historical Provider, MD  hydrochlorothiazide (HYDRODIURIL) 25 MG tablet Take 25 mg by mouth daily.   Yes Historical Provider, MD  pantoprazole (PROTONIX) 40 MG tablet Take 40 mg by mouth daily. 09/09/13  Yes Historical Provider, MD  traZODone (DESYREL) 100 MG tablet Take 100 mg by mouth at bedtime.   Yes Historical Provider, MD  venlafaxine (EFFEXOR-XR) 150 MG 24 hr capsule Take 150 mg by mouth daily.   Yes Historical Provider, MD  diphenhydrAMINE (BENADRYL) 25 MG tablet Take 1 tablet (25 mg total) by mouth every 6 (six) hours as needed for itching. 02/14/14   Kathie Dike, MD  hydrocortisone cream 1 % Apply topically 2 (two) times daily. 02/14/14   Kathie Dike, MD   BP 144/93 mmHg  Pulse 90  Temp(Src) 98.2 F (36.8 C) (Oral)  Resp 17  Ht 5\' 10"  (1.778 m)  Wt 188 lb 11.4 oz (85.6 kg)  BMI 27.08 kg/m2  SpO2 97% Physical Exam  Constitutional: He appears well-developed and well-nourished.  Sedate  HENT:  Head: Normocephalic and atraumatic.  pupils sluggish  Eyes: Right eye exhibits no discharge. Left eye exhibits no discharge.  Neck: Neck supple. No tracheal deviation present.  Cardiovascular: Normal rate.   Pulmonary/Chest: Effort normal. No respiratory distress.  Abdominal: He exhibits no distension. Bowel sounds are decreased.  Neurological: He is alert.  slurred speech  Skin: Skin is warm and dry.  Nursing note and vitals reviewed.   ED Course  Procedures   DIAGNOSTIC STUDIES: Oxygen Saturation is 96% on RA, normal by my interpretation.    COORDINATION OF CARE: 8:16 PM Discussed treatment plan with pt at bedside and pt agreed to plan.   Labs Review Labs Reviewed  CBC WITH DIFFERENTIAL - Abnormal; Notable for the following:    MCHC 36.5 (*)    All other components within normal limits  ETHANOL - Abnormal; Notable for the following:    Alcohol, Ethyl (B) 79 (*)    All other components within normal limits   URINE RAPID DRUG SCREEN (HOSP PERFORMED) - Abnormal; Notable for the following:    Benzodiazepines POSITIVE (*)    All other components within normal limits  ACETAMINOPHEN LEVEL - Abnormal; Notable for the following:    Acetaminophen (Tylenol), Serum <10.0 (*)    All other components within normal limits  COMPREHENSIVE METABOLIC PANEL - Abnormal; Notable for the following:    Potassium 3.0 (*)    Glucose, Bld 100 (*)    All other components within normal limits  BASIC METABOLIC PANEL - Abnormal; Notable for the following:    Glucose, Bld 112 (*)    All other components within normal limits  MRSA PCR SCREENING  MAGNESIUM  SALICYLATE LEVEL  MAGNESIUM    Imaging Review No results found.   EKG Interpretation   Date/Time:  Monday February 13 2014 20:31:43 EST Ventricular Rate:  135 PR Interval:  224 QRS Duration: 72 QT Interval:  383 QTC Calculation: 574 R Axis:   66 Text Interpretation:  Sinus tachycardia Prolonged PR interval Anterior  infarct, old Borderline ST depression, anterolateral leads Prolonged QT  interval Confirmed by Alvino Chapel  MD, Ovid Curd [phone]) on 02/14/2014 11:51:35  PM      MDM   Final diagnoses:  Overdose, intentional self-harm, initial encounter  Suicide attempt  Alcohol abuse   Patient with altered mental status after reported overdose. Unknown what the overdose was. If it was his long-acting medication will need admission for 24 hours. I discussed with poison control. Will admit to internal medicine.  I personally performed the services described in this documentation, which was scribed in my presence. The recorded information has been reviewed and is accurate.       Jasper Riling. Alvino Chapel, MD 02/14/14 2352

## 2014-02-14 ENCOUNTER — Inpatient Hospital Stay (HOSPITAL_COMMUNITY)
Admission: AD | Admit: 2014-02-14 | Discharge: 2014-02-20 | DRG: 885 | Disposition: A | Discharge status: Home / Self Care | Payer: Medicare HMO | Source: Intra-hospital | Attending: Emergency Medicine | Admitting: Emergency Medicine

## 2014-02-14 ENCOUNTER — Encounter (HOSPITAL_COMMUNITY): Payer: Self-pay | Admitting: Unknown Physician Specialty

## 2014-02-14 DIAGNOSIS — F17210 Nicotine dependence, cigarettes, uncomplicated: Secondary | ICD-10-CM | POA: Diagnosis present

## 2014-02-14 DIAGNOSIS — G4700 Insomnia, unspecified: Secondary | ICD-10-CM | POA: Diagnosis present

## 2014-02-14 DIAGNOSIS — F332 Major depressive disorder, recurrent severe without psychotic features: Secondary | ICD-10-CM | POA: Diagnosis present

## 2014-02-14 DIAGNOSIS — M1990 Unspecified osteoarthritis, unspecified site: Secondary | ICD-10-CM | POA: Diagnosis present

## 2014-02-14 DIAGNOSIS — J45909 Unspecified asthma, uncomplicated: Secondary | ICD-10-CM | POA: Diagnosis not present

## 2014-02-14 DIAGNOSIS — F1010 Alcohol abuse, uncomplicated: Secondary | ICD-10-CM | POA: Diagnosis present

## 2014-02-14 DIAGNOSIS — F209 Schizophrenia, unspecified: Secondary | ICD-10-CM | POA: Diagnosis present

## 2014-02-14 DIAGNOSIS — Z8711 Personal history of peptic ulcer disease: Secondary | ICD-10-CM

## 2014-02-14 DIAGNOSIS — L0591 Pilonidal cyst without abscess: Secondary | ICD-10-CM | POA: Insufficient documentation

## 2014-02-14 DIAGNOSIS — T50902D Poisoning by unspecified drugs, medicaments and biological substances, intentional self-harm, subsequent encounter: Secondary | ICD-10-CM

## 2014-02-14 DIAGNOSIS — T43212A Poisoning by selective serotonin and norepinephrine reuptake inhibitors, intentional self-harm, initial encounter: Secondary | ICD-10-CM

## 2014-02-14 DIAGNOSIS — R45851 Suicidal ideations: Secondary | ICD-10-CM

## 2014-02-14 DIAGNOSIS — T426X2A Poisoning by other antiepileptic and sedative-hypnotic drugs, intentional self-harm, initial encounter: Secondary | ICD-10-CM

## 2014-02-14 DIAGNOSIS — I10 Essential (primary) hypertension: Secondary | ICD-10-CM | POA: Diagnosis present

## 2014-02-14 DIAGNOSIS — L0231 Cutaneous abscess of buttock: Secondary | ICD-10-CM

## 2014-02-14 DIAGNOSIS — Z8673 Personal history of transient ischemic attack (TIA), and cerebral infarction without residual deficits: Secondary | ICD-10-CM | POA: Diagnosis not present

## 2014-02-14 DIAGNOSIS — G9340 Encephalopathy, unspecified: Secondary | ICD-10-CM

## 2014-02-14 DIAGNOSIS — F1014 Alcohol abuse with alcohol-induced mood disorder: Secondary | ICD-10-CM | POA: Diagnosis present

## 2014-02-14 DIAGNOSIS — T1491XA Suicide attempt, initial encounter: Secondary | ICD-10-CM | POA: Diagnosis present

## 2014-02-14 DIAGNOSIS — E876 Hypokalemia: Secondary | ICD-10-CM | POA: Diagnosis present

## 2014-02-14 DIAGNOSIS — K219 Gastro-esophageal reflux disease without esophagitis: Secondary | ICD-10-CM | POA: Diagnosis present

## 2014-02-14 DIAGNOSIS — T481X2A Poisoning by skeletal muscle relaxants [neuromuscular blocking agents], intentional self-harm, initial encounter: Principal | ICD-10-CM

## 2014-02-14 DIAGNOSIS — F419 Anxiety disorder, unspecified: Secondary | ICD-10-CM | POA: Diagnosis present

## 2014-02-14 DIAGNOSIS — F3130 Bipolar disorder, current episode depressed, mild or moderate severity, unspecified: Secondary | ICD-10-CM

## 2014-02-14 DIAGNOSIS — K611 Rectal abscess: Secondary | ICD-10-CM

## 2014-02-14 LAB — RAPID URINE DRUG SCREEN, HOSP PERFORMED
Amphetamines: NOT DETECTED
BARBITURATES: NOT DETECTED
Benzodiazepines: POSITIVE — AB
Cocaine: NOT DETECTED
Opiates: NOT DETECTED
Tetrahydrocannabinol: NOT DETECTED

## 2014-02-14 LAB — MAGNESIUM: MAGNESIUM: 2.1 mg/dL (ref 1.5–2.5)

## 2014-02-14 LAB — BASIC METABOLIC PANEL
Anion gap: 6 (ref 5–15)
BUN: 9 mg/dL (ref 6–23)
CHLORIDE: 106 meq/L (ref 96–112)
CO2: 29 mmol/L (ref 19–32)
CREATININE: 0.88 mg/dL (ref 0.50–1.35)
Calcium: 8.8 mg/dL (ref 8.4–10.5)
GFR calc Af Amer: >90 mL/min (ref >90)
Glucose, Bld: 112 mg/dL — ABNORMAL HIGH (ref 70–99)
Potassium: 3.7 mmol/L (ref 3.5–5.1)
SODIUM: 141 mmol/L (ref 135–145)

## 2014-02-14 LAB — MRSA PCR SCREENING: MRSA by PCR: NEGATIVE

## 2014-02-14 MED ORDER — ENOXAPARIN SODIUM 40 MG/0.4ML ~~LOC~~ SOLN
40.0000 mg | SUBCUTANEOUS | Status: DC
  Administered 2014-02-14: 40 mg via SUBCUTANEOUS
  Filled 2014-02-14: qty 0.4

## 2014-02-14 MED ORDER — ACETAMINOPHEN 325 MG PO TABS: 650.0000 mg | ORAL_TABLET | Freq: Four times a day (QID) | ORAL | Status: DC | PRN

## 2014-02-14 MED ORDER — HYDROCORTISONE 1 % EX CREA: TOPICAL_CREAM | Freq: Two times a day (BID) | CUTANEOUS | Status: DC

## 2014-02-14 MED ORDER — PNEUMOCOCCAL VAC POLYVALENT 25 MCG/0.5ML IJ INJ
0.5000 mL | INJECTION | INTRAMUSCULAR | Status: DC
  Filled 2014-02-14: qty 0.5

## 2014-02-14 MED ORDER — INFLUENZA VAC SPLIT QUAD 0.5 ML IM SUSY
0.5000 mL | PREFILLED_SYRINGE | INTRAMUSCULAR | Status: DC
  Filled 2014-02-14: qty 0.5

## 2014-02-14 MED ORDER — DIPHENHYDRAMINE HCL 25 MG PO TABS: 25.0000 mg | ORAL_TABLET | Freq: Four times a day (QID) | ORAL | Status: DC | PRN

## 2014-02-14 MED ORDER — ONDANSETRON HCL 4 MG PO TABS: 4.0000 mg | ORAL_TABLET | Freq: Four times a day (QID) | ORAL | Status: DC | PRN

## 2014-02-14 MED ORDER — POTASSIUM CHLORIDE 10 MEQ/100ML IV SOLN
10.0000 meq | INTRAVENOUS | Status: AC
  Administered 2014-02-14 (×3): 10 meq via INTRAVENOUS
  Filled 2014-02-14: qty 100

## 2014-02-14 MED ORDER — PANTOPRAZOLE SODIUM 40 MG PO TBEC
40.0000 mg | DELAYED_RELEASE_TABLET | Freq: Every day | ORAL | Status: DC
  Administered 2014-02-14: 40 mg via ORAL
  Filled 2014-02-14: qty 1

## 2014-02-14 MED ORDER — ONDANSETRON HCL 4 MG/2ML IJ SOLN: 4.0000 mg | Freq: Four times a day (QID) | INTRAMUSCULAR | Status: DC | PRN

## 2014-02-14 MED ORDER — SODIUM CHLORIDE 0.9 % IV SOLN
INTRAVENOUS | Status: DC
  Administered 2014-02-14 (×2): via INTRAVENOUS

## 2014-02-14 MED ORDER — ACETAMINOPHEN 650 MG RE SUPP: 650.0000 mg | Freq: Four times a day (QID) | RECTAL | Status: DC | PRN

## 2014-02-14 MED ORDER — HYDRALAZINE HCL 20 MG/ML IJ SOLN: 10.0000 mg | INTRAMUSCULAR | Status: DC | PRN

## 2014-02-14 MED ORDER — DIPHENHYDRAMINE HCL 50 MG/ML IJ SOLN
25.0000 mg | Freq: Once | INTRAMUSCULAR | Status: AC
  Administered 2014-02-14: 25 mg via INTRAVENOUS
  Filled 2014-02-14: qty 1

## 2014-02-14 NOTE — Progress Notes (Signed)
Patient discharge teaching given, including activity, diet, follow-up appoints, and medications. Patient verbalized understanding of all discharge instructions. IV access was d/c'd. Vitals are stable. Skin is intact except as charted in most recent assessments. Pt to be escorted out by police officer, to be transported to [facility].

## 2014-02-14 NOTE — Care Management Utilization Note (Signed)
UR completed 

## 2014-02-14 NOTE — Consult Note (Signed)
Telepsych Consultation   Reason for Consult:  Suicide attempt (45 pills) Referring Physician:  [facility]   Frank Moses is an 49 y.o. male.  Assessment: AXIS I:  Bipolar, Depressed AXIS II:  Deferred AXIS III:   Past Medical History  Diagnosis Date  . Hypertension   . Arthritis   . Bipolar 1 disorder   . Schizophrenia   . Depression   . Stroke   . Asthma   . Peptic ulcer    AXIS IV:  other psychosocial or environmental problems, problems related to legal system/crime, problems related to social environment and problems with primary support group AXIS V:  11-20 some danger of hurting self or others possible OR occasionally fails to maintain minimal personal hygiene OR gross impairment in communication  Plan:  Recommend psychiatric Inpatient admission when medically cleared.  Subjective:   Frank Moses is a 49 y.o. male patient admitted with reports from [facility] staff that he overdosed and had to have Narcan to wake him up, yet was still stuporous. Pt is minimizing suicidal ideation at this time but admits that he "definitely wanted to die" last night when he took "about 45 pills total with Ambien, Flexeril, Trazodone, Effexor, and some others". He reports taking multiple handfulls of pills intentionally with the intention to commit suicide. He denies any previous attempts but when reminded that his chart said prior attempts and [place], he reports "oh, that was years ago, I don't even remember exactly when". He reports that Dr. Hoyle Barr at [facility] has been helping him with his Bipolar and that he has been on Effexor but that he has been seeing "funny things every now and then, not too bad, but kinda like shadows and things for a few weeks". He did not appear to be concerned about the visual hallucinations but did report that he is hopeful his medication can be adjusted to where that will resolve.   HPI:  49 year old male who  has a past medical history of Hypertension; Arthritis; Bipolar 1  disorder; Schizophrenia; Depression; Stroke; Asthma; and Peptic ulcer. Today was brought to the ED by police, after patient called 911 and told them that he has taken 30 Flexeril and 15 trazodone in an effort to kill himself. Patient was given Narcan by the EMS and he did perk up for a while, but at this time patient is to stuporous to answer any questions.No review of systems obtainable at this time. As per the ED physician, patient tried to commit suicide also admits to drinking 6 pack beer. He took pills as he did not want to live anymore.  HPI Elements:   Location:  Psychiatric. Quality:  Worsening. Severity:  Severe. Timing:  Constant. Duration:  Chronic depression with recent exacerbation. Context:  Exacerbation of underlying bipolar depression secondary to life stressors including restraining orders, breakup with gf of 23 years, and custody concerns for his children.  Past Psychiatric History: Past Medical History  Diagnosis Date  . Hypertension   . Arthritis   . Bipolar 1 disorder   . Schizophrenia   . Depression   . Stroke   . Asthma   . Peptic ulcer     reports that he has been smoking Cigarettes.  He has been smoking about 1.00 pack per day. He does not have any smokeless tobacco history on file. He reports that he drinks alcohol. He reports that he does not use illicit drugs. History reviewed. No pertinent family history.  Allergies:   Allergies  Allergen Reactions  . Shrimp [Shellfish Allergy] Other (See Comments)    Triggers gout flare  . Orange Fruit [Citrus]   . Tomato Rash and Other (See Comments)    REACTION: Boil-like spots on skin    ACT Assessment Complete:  Yes:    Educational Status    Risk to Self: Risk to self with the past 6 months Substance abuse history and/or treatment for substance abuse?: Yes  Risk to Others:    Abuse:    Prior Inpatient Therapy:    Prior Outpatient Therapy:    Additional Information:                     Objective: Blood pressure 148/89, pulse 80, temperature 98 F (36.7 C), temperature source Oral, resp. rate 15, height 5' 10"  (1.778 m), weight 85.6 kg (188 lb 11.4 oz), SpO2 98 %.Body mass index is 27.08 kg/(m^2). Results for orders placed or performed during the hospital encounter of 02/13/14 (from the past 72 hour(s))  CBC with Differential     Status: Abnormal   Collection Time: 02/13/14  8:43 PM  Result Value Ref Range   WBC 7.6 4.0 - 10.5 K/uL   RBC 5.03 4.22 - 5.81 MIL/uL   Hemoglobin 17.0 13.0 - 17.0 g/dL   HCT 46.6 39.0 - 52.0 %   MCV 92.6 78.0 - 100.0 fL   MCH 33.8 26.0 - 34.0 pg   MCHC 36.5 (H) 30.0 - 36.0 g/dL   RDW 12.5 11.5 - 15.5 %   Platelets 182 150 - 400 K/uL   Neutrophils Relative % 53 43 - 77 %   Neutro Abs 4.0 1.7 - 7.7 K/uL   Lymphocytes Relative 36 12 - 46 %   Lymphs Abs 2.7 0.7 - 4.0 K/uL   Monocytes Relative 8 3 - 12 %   Monocytes Absolute 0.6 0.1 - 1.0 K/uL   Eosinophils Relative 3 0 - 5 %   Eosinophils Absolute 0.3 0.0 - 0.7 K/uL   Basophils Relative 0 0 - 1 %   Basophils Absolute 0.0 0.0 - 0.1 K/uL  Ethanol     Status: Abnormal   Collection Time: 02/13/14  8:43 PM  Result Value Ref Range   Alcohol, Ethyl (B) 79 (H) 0 - 9 mg/dL    Comment:        LOWEST DETECTABLE LIMIT FOR SERUM ALCOHOL IS 11 mg/dL FOR MEDICAL PURPOSES ONLY   Acetaminophen level     Status: Abnormal   Collection Time: 02/13/14  8:43 PM  Result Value Ref Range   Acetaminophen (Tylenol), Serum <10.0 (L) 10 - 30 ug/mL    Comment:        THERAPEUTIC CONCENTRATIONS VARY SIGNIFICANTLY. A RANGE OF 10-30 ug/mL MAY BE AN EFFECTIVE CONCENTRATION FOR MANY PATIENTS. HOWEVER, SOME ARE BEST TREATED AT CONCENTRATIONS OUTSIDE THIS RANGE. ACETAMINOPHEN CONCENTRATIONS >150 ug/mL AT 4 HOURS AFTER INGESTION AND >50 ug/mL AT 12 HOURS AFTER INGESTION ARE OFTEN ASSOCIATED WITH TOXIC REACTIONS.   Comprehensive metabolic panel     Status: Abnormal    Collection Time: 02/13/14  8:43 PM  Result Value Ref Range   Sodium 136 135 - 145 mmol/L    Comment: Please note change in reference range.   Potassium 3.0 (L) 3.5 - 5.1 mmol/L    Comment: Please note change in reference range.   Chloride 98 96 - 112 mEq/L   CO2 24 19 - 32 mmol/L   Glucose, Bld 100 (H)  70 - 99 mg/dL   BUN 8 6 - 23 mg/dL   Creatinine, Ser 0.80 0.50 - 1.35 mg/dL   Calcium 9.1 8.4 - 10.5 mg/dL   Total Protein 7.6 6.0 - 8.3 g/dL   Albumin 4.4 3.5 - 5.2 g/dL   AST 36 0 - 37 U/L   ALT 29 0 - 53 U/L   Alkaline Phosphatase 94 39 - 117 U/L   Total Bilirubin 0.8 0.3 - 1.2 mg/dL   GFR calc non Af Amer >90 >90 mL/min   GFR calc Af Amer >90 >90 mL/min    Comment: (NOTE) The eGFR has been calculated using the CKD EPI equation. This calculation has not been validated in all clinical situations. eGFR's persistently <90 mL/min signify possible Chronic Kidney Disease.    Anion gap 14 5 - 15  Magnesium     Status: None   Collection Time: 02/13/14  8:43 PM  Result Value Ref Range   Magnesium 2.3 1.5 - 2.5 mg/dL  Salicylate level     Status: None   Collection Time: 02/13/14  8:43 PM  Result Value Ref Range   Salicylate Lvl <0.3 2.8 - 20.0 mg/dL  MRSA PCR Screening     Status: None   Collection Time: 02/14/14 12:04 AM  Result Value Ref Range   MRSA by PCR NEGATIVE NEGATIVE    Comment:        The GeneXpert MRSA Assay (FDA approved for NASAL specimens only), is one component of a comprehensive MRSA colonization surveillance program. It is not intended to diagnose MRSA infection nor to guide or monitor treatment for MRSA infections.   Drug screen panel, emergency     Status: Abnormal   Collection Time: 02/14/14  2:35 AM  Result Value Ref Range   Opiates NONE DETECTED NONE DETECTED   Cocaine NONE DETECTED NONE DETECTED   Benzodiazepines POSITIVE (A) NONE DETECTED   Amphetamines NONE DETECTED NONE DETECTED   Tetrahydrocannabinol NONE DETECTED NONE DETECTED    Barbiturates NONE DETECTED NONE DETECTED    Comment:        DRUG SCREEN FOR MEDICAL PURPOSES ONLY.  IF CONFIRMATION IS NEEDED FOR ANY PURPOSE, NOTIFY LAB WITHIN 5 DAYS.        LOWEST DETECTABLE LIMITS FOR URINE DRUG SCREEN Drug Class       Cutoff (ng/mL) Amphetamine      1000 Barbiturate      200 Benzodiazepine   500 Tricyclics       938 Opiates          300 Cocaine          300 THC              50   Basic metabolic panel     Status: Abnormal   Collection Time: 02/14/14 11:32 AM  Result Value Ref Range   Sodium 141 135 - 145 mmol/L    Comment: Please note change in reference range.   Potassium 3.7 3.5 - 5.1 mmol/L    Comment: DELTA CHECK NOTED Please note change in reference range.    Chloride 106 96 - 112 mEq/L   CO2 29 19 - 32 mmol/L   Glucose, Bld 112 (H) 70 - 99 mg/dL   BUN 9 6 - 23 mg/dL   Creatinine, Ser 0.88 0.50 - 1.35 mg/dL   Calcium 8.8 8.4 - 10.5 mg/dL   GFR calc non Af Amer >90 >90 mL/min   GFR calc Af Amer >90 >90 mL/min  Comment: (NOTE) The eGFR has been calculated using the CKD EPI equation. This calculation has not been validated in all clinical situations. eGFR's persistently <90 mL/min signify possible Chronic Kidney Disease.    Anion gap 6 5 - 15  Magnesium     Status: None   Collection Time: 02/14/14 11:32 AM  Result Value Ref Range   Magnesium 2.1 1.5 - 2.5 mg/dL   Labs are reviewed and are pertinent for UDS + benzo (pt takes Klonopin) and BAL 79.  Current Facility-Administered Medications  Medication Dose Route Frequency Provider Last Rate Last Dose  . 0.9 %  sodium chloride infusion   Intravenous Continuous Oswald Hillock, MD 125 mL/hr at 02/14/14 0727    . acetaminophen (TYLENOL) tablet 650 mg  650 mg Oral Q6H PRN Oswald Hillock, MD       Or  . acetaminophen (TYLENOL) suppository 650 mg  650 mg Rectal Q6H PRN Oswald Hillock, MD      . enoxaparin (LOVENOX) injection 40 mg  40 mg Subcutaneous Q24H Oswald Hillock, MD   40 mg at 02/14/14 0802  .  hydrALAZINE (APRESOLINE) injection 10 mg  10 mg Intravenous Q4H PRN Oswald Hillock, MD      . ondansetron (ZOFRAN) tablet 4 mg  4 mg Oral Q6H PRN Oswald Hillock, MD       Or  . ondansetron (ZOFRAN) injection 4 mg  4 mg Intravenous Q6H PRN Oswald Hillock, MD        Psychiatric Specialty Exam:     Blood pressure 148/89, pulse 80, temperature 98 F (36.7 C), temperature source Oral, resp. rate 15, height 5' 10"  (1.778 m), weight 85.6 kg (188 lb 11.4 oz), SpO2 98 %.Body mass index is 27.08 kg/(m^2).  General Appearance: Casual and Fairly Groomed  Engineer, water::  Good  Speech:  Clear and Coherent and Normal Rate  Volume:  Normal  Mood:  Depressed  Affect:  Depressed  Thought Process:  Coherent and Goal Directed  Orientation:  Full (Time, Place, and Person)  Thought Content:  WDL  Suicidal Thoughts:  Yes.  with intent/plan  Homicidal Thoughts:  No  Memory:  Immediate;   Fair Recent;   Fair Remote;   Fair  Judgement:  Fair  Insight:  Fair  Psychomotor Activity:  Normal  Concentration:  Good  Recall:  Good  Akathisia:  No  Handed:    AIMS (if indicated):     Assets:  Communication Skills Desire for Improvement Resilience Social Support  Sleep:      Treatment Plan Summary: Admit to inpatient for psychiatric stabilization and medication management.  *When medically cleared.   Disposition:  As above Pt accepted at [facility], Hydrographic surveyor in agreement about appropriateness.    *Case reviewed with Dr. Horald Pollen, Elyse Jarvis, FNP-BC 02/14/2014 1:46 PM

## 2014-02-14 NOTE — Progress Notes (Signed)
Pt a/o.vss. Denies wanting to harm himself or others. Sitter at bedside with patient. Report given to L.Granville Lewis, Therapist, sports. Pt to be transferred to room 337 after psych consult.

## 2014-02-14 NOTE — Progress Notes (Signed)
[facility] PROGRESS NOTE  Frank Moses JFH:[id number] DOB: 24-Dec-1965 DOA: 02/13/2014 PCP: Celedonio Savage, MD  Assessment/Plan: 1. Intentional overdose. Patient reports taking multiple medications including Flexeril, trazodone as well as alcohol. He admits to being depressed. He's had previous overdose attempts and has been admitted at [facility]. We'll request psychiatry input to determine further disposition. 2. Suicidal ideations. Continue sitter precautions. 3. Toxic encephalopathy related to overdose of medications. Appears to have resolved. 4. History of hypertension. Currently stable. 5. Hypokalemia. Replace 6. Prolonged QT, likely related to medications. Will repeat EKG.  His EKG shows improving QT interval, I suspect he should be able to be medically cleared for further psychiatric disposition. Will transfer to telemetry.  Code Status: Full code Family Communication: Discussed with patient Disposition Plan: Pending psychiatry evaluation   Consultants:  Psychiatry  Procedures:    Antibiotics:    HPI/Subjective: Patient reports having a headache. He denies any other complaints. Admits to being depressed and intentional overdose.  Objective: Filed Vitals:   02/14/14 1100  BP: 145/83  Pulse: 82  Temp:   Resp: 15    Intake/Output Summary (Last 24 hours) at 02/14/14 1148 Last data filed at 02/14/14 0400  Gross per 24 hour  Intake 772.92 ml  Output    950 ml  Net -177.08 ml   Filed Weights   02/13/14 2000 02/14/14 0002  Weight: 91.173 kg (201 lb) 85.6 kg (188 lb 11.4 oz)    Exam:   General:  NAD  Cardiovascular: S1, S2 RRR  Respiratory: CTA B  Abdomen: soft, nt, nd, bs+  Musculoskeletal: no edema b/l   Data Reviewed: Basic Metabolic Panel:  Recent Labs Lab 02/13/14 2043  NA 136  K 3.0*  CL 98  CO2 24  GLUCOSE 100*  BUN 8  CREATININE 0.80  CALCIUM 9.1  MG 2.3   Liver Function Tests:  Recent Labs Lab 02/13/14 2043  AST 36  ALT  29  ALKPHOS 94  BILITOT 0.8  PROT 7.6  ALBUMIN 4.4   No results for input(s): LIPASE, AMYLASE in the last 168 hours. No results for input(s): AMMONIA in the last 168 hours. CBC:  Recent Labs Lab 02/13/14 2043  WBC 7.6  NEUTROABS 4.0  HGB 17.0  HCT 46.6  MCV 92.6  PLT 182   Cardiac Enzymes: No results for input(s): CKTOTAL, CKMB, CKMBINDEX, TROPONINI in the last 168 hours. BNP (last 3 results) No results for input(s): PROBNP in the last 8760 hours. CBG: No results for input(s): GLUCAP in the last 168 hours.  Recent Results (from the past 240 hour(s))  MRSA PCR Screening     Status: None   Collection Time: 02/14/14 12:04 AM  Result Value Ref Range Status   MRSA by PCR NEGATIVE NEGATIVE Final    Comment:        The GeneXpert MRSA Assay (FDA approved for NASAL specimens only), is one component of a comprehensive MRSA colonization surveillance program. It is not intended to diagnose MRSA infection nor to guide or monitor treatment for MRSA infections.      Studies: No results found.  Scheduled Meds: . enoxaparin (LOVENOX) injection  40 mg Subcutaneous Q24H   Continuous Infusions: . sodium chloride 125 mL/hr at 02/14/14 9373    Active Problems:   Overdose   Altered mental state   HTN (hypertension)    Time spent: 80mins    Phillipe Clemon  [facility] Pager [phone]. If 7PM-7AM, please contact night-coverage at [email or web address], password [facility] 02/14/2014, 11:48 AM  LOS: 1 day

## 2014-02-14 NOTE — Care Management Note (Addendum)
    Page 1 of 1   02/14/2014     2:50:19 PM CARE MANAGEMENT NOTE 02/14/2014  Patient:  Frank Moses   Account Number:  [id number]  Date Initiated:  02/14/2014  Documentation initiated by:  Jolene Provost  Subjective/Objective Assessment:   Pt admitted after overdosing and attempted suicide. Pt is from home. Pending TTS referral anticipating discharge to inpt behavioral health. SCW aware of dishcarge plan and will facilitate placement. No CM needs at this time.     Action/Plan:   Anticipated DC Date:  02/15/2014   Anticipated DC Plan:  [place] referral  Clinical Social Worker      DC Planning Services  CM consult      Choice offered to / List presented to:             Status of service:  Completed, signed off Medicare Important Message given?   (If response is "NO", the following Medicare IM given date fields will be blank) Date Medicare IM given:   Medicare IM given by:   Date Additional Medicare IM given:   Additional Medicare IM given by:    Discharge Disposition:  [place]  Per UR Regulation:    If discussed at [place] of Stay Meetings, dates discussed:    Comments:  02/14/2014 [place], RN, MSN, [facility]

## 2014-02-14 NOTE — Clinical Social Work Psychosocial (Signed)
Clinical Social Work Department BRIEF PSYCHOSOCIAL ASSESSMENT 02/14/2014  Patient:  Frank Moses, Frank Moses     Account Number:  [id number]     Admit date:  02/13/2014  Clinical Social Worker:  Legrand Como  Date/Time:  02/14/2014 11:20 AM  Referred by:  CSW  Date Referred:  02/14/2014 Referred for  Behavioral Health Issues   Other Referral:   Interview type:  Patient Other interview type:    PSYCHOSOCIAL DATA Living Status:  FAMILY Admitted from facility:   Level of care:   Primary support name:  Luan Pulling Primary support relationship to patient:  PARENT Degree of support available:    CURRENT CONCERNS Current Concerns  Behavioral Health Issues   Other Concerns:    SOCIAL WORK ASSESSMENT / PLAN CSW met with patient who was alert and oriented. Patient indicated that he has been feeling depressed since 01/26/14.  He stated that his depression was triggered by learning that his girlfriend was cheating on him.  He indicated that he is unsure as to if his relationship with her is over.  Patient indicated that yesterday he "just felt like it" as to what triggered him to attempt to end his life.  Patient indicated that he took psychotropic medications as well as his physical health medications in an attempt to overdose.  He reported that he also drank six beers.  Patient indicated that he previously had a history of drinking 24-30 beers daily but denied that this is his current usage. Patient was tearful as he discussed the overdose and relationship issues. He stated that he has pending court charges for communicating threats to his girlfriend and was scheduled to be in court today.  Patient requested that CSW contact [facility] and notify them of his current hospitalization.  Patient indicated that he sees Dr. Hoyle Barr at [facility] for his mental health treatment.  Patient indicated that he is pleased with the services that he receives at [facility].  He stated that he takes  Effexor and Trazadone.  Patient indicated that he is not employed due to being disabled. Patient reports that he ambulates unassisted and needs no assistance with his ADL's.  He indicated that he lives with his parents. CSW discussed with patient that he would have a TTS consult and educated him on the process.  CSW faxed a letter to [facility] at patient;s request indicating that he was currently in the hospital.  CSW provided a copy of the letter to patient for his records.   Assessment/plan status:  Information/Referral to [employer] Other assessment/ plan:   Information/referral to community resources:    PATIENT'S/FAMILY'S RESPONSE TO PLAN OF CARE: Patient understands that he will have a TTS consult and may have to go to inpatient behavioral health.    Ambrose Pancoast, [place]

## 2014-02-14 NOTE — Progress Notes (Signed)
Report given to [facility], who verbalized understanding.

## 2014-02-14 NOTE — Progress Notes (Signed)
Per [facility] at [facility], [place] has been accepted to the unit. Pt can be admitted after 7:00pm.  Peri Maris, LCSWA 02/14/2014 3:14 PM

## 2014-02-14 NOTE — Discharge Summary (Signed)
Physician Discharge Summary  [facility] CXK:[id number] DOB: 12/12/1965 DOA: 02/13/2014  PCP: Celedonio Savage, MD  Admit date: 02/13/2014 Discharge date: 02/14/2014  Time spent: 81minutes  Recommendations for Outpatient Follow-up:  1. Patient will be discharged to inpatient psychiatry for further care  Discharge Diagnoses:  Principal Problem:   Overdose Active Problems:   Altered mental state   HTN (hypertension)   Hypokalemia   Suicidal ideation Nonspecific rash of right arm  Discharge Condition: stable  Diet recommendation: heart healthy  Filed Weights   02/13/14 2000 02/14/14 0002  Weight: 91.173 kg (201 lb) 85.6 kg (188 lb 11.4 oz)    History of present illness:  This patient was admitted to the hospital with altered mental status after an intentional overdose and suicide attempt. He was found to be lethargic in the emergency room. He had taken multiple medications including Flexeril, trazodone. He was admitted to the hospital for treatment  Hospital Course:  Patient was monitored in the stepdown unit. He was initially noted to have a prolonged QTC, but this appears to be improving on follow-up EKGs. He was aggressively hydrated with IV fluids. His mental status appears to have returned to baseline. He does complain of chest pain, which appears to be reproducible on palpation. His EKG did not show any acute changes or signs of ischemia. He reports having this pain, intermittently for several months now. This appears very typical of musculoskeletal origin. Would not pursue any further workup at this time. He also was noted to have a nonspecific rash erupt on his right arm. He'll be given hydrocortisone since it is pleuritic. This can be followed in the outpatient setting. He was seen by psychiatry and felt that he would benefit from inpatient psychiatry evaluation. He'll be transferred there later today. He is otherwise medically cleared for  transfer.  Procedures:    Consultations:  psychiatry  Discharge Exam: Filed Vitals:   02/14/14 1650  BP: 134/82  Pulse: 78  Temp: 98.2 F (36.8 C)  Resp: 16    General: NAD Cardiovascular: S1, S2 RRR Respiratory: CTA B  Discharge Instructions   Discharge Instructions    Diet - low sodium heart healthy    Complete by:  As directed      Increase activity slowly    Complete by:  As directed           Current Discharge Medication List    START taking these medications   Details  diphenhydrAMINE (BENADRYL) 25 MG tablet Take 1 tablet (25 mg total) by mouth every 6 (six) hours as needed for itching. Qty: 30 tablet, Refills: 0    hydrocortisone cream 1 % Apply topically 2 (two) times daily. Qty: 30 g, Refills: 0      CONTINUE these medications which have NOT CHANGED   Details  allopurinol (ZYLOPRIM) 300 MG tablet Take 300 mg by mouth daily.    Aspirin-Salicylamide-Caffeine (BC HEADACHE) 325-95-16 MG TABS Take 1 packet by mouth every 6 (six) hours as needed (for pain).    gabapentin (NEURONTIN) 300 MG capsule Take 300 mg by mouth 3 (three) times daily.    hydrochlorothiazide (HYDRODIURIL) 25 MG tablet Take 25 mg by mouth daily.    pantoprazole (PROTONIX) 40 MG tablet Take 40 mg by mouth daily.    traZODone (DESYREL) 100 MG tablet Take 100 mg by mouth at bedtime.    venlafaxine (EFFEXOR-XR) 150 MG 24 hr capsule Take 150 mg by mouth daily.      STOP taking these  medications     cyclobenzaprine (FLEXERIL) 10 MG tablet      mirtazapine (REMERON) 15 MG tablet        Allergies  Allergen Reactions  . Shrimp [Shellfish Allergy] Other (See Comments)    Triggers gout flare  . Orange Fruit [Citrus]   . Tomato Rash and Other (See Comments)    REACTION: Boil-like spots on skin      The results of significant diagnostics from this hospitalization (including imaging, microbiology, ancillary and laboratory) are listed below for reference.    Significant  Diagnostic Studies: Ct Abdomen Pelvis Wo Contrast  01/31/2014   CLINICAL DATA:  Found unresponsive in middle of road. Concern for chest or abdominal injury. Initial encounter.  EXAM: CT CHEST, ABDOMEN AND PELVIS WITHOUT CONTRAST  TECHNIQUE: Multidetector CT imaging of the chest, abdomen and pelvis was performed following the standard protocol without IV contrast.  COMPARISON:  Lumbar spine radiographs performed 01/13/2014, and CT of the abdomen and pelvis performed 10/23/2007  FINDINGS: CT CHEST FINDINGS  Minimal bibasilar atelectasis is noted. The lungs are otherwise clear. A bleb is seen at the left lung apex. No focal consolidation, pleural effusion or pneumothorax is seen. There is no evidence of pulmonary parenchymal contusion. No masses are seen.  The mediastinum is unremarkable appearance. There is no evidence of venous hemorrhage. No mediastinal lymphadenopathy is seen. No pericardial effusion is identified. The great vessels are grossly unremarkable in appearance. The visualized portions of thyroid gland are unremarkable. No axillary lymphadenopathy is seen.  There is no evidence of significant soft tissue injury along the chest wall.  No acute osseous abnormalities are identified. Chronic healed left posterior rib fractures are seen.  CT ABDOMEN AND PELVIS FINDINGS  No free air or free fluid is seen within the abdomen or pelvis. There is no evidence of solid or hollow organ injury.  The liver and spleen are unremarkable in appearance. The gallbladder is within normal limits. The pancreas and adrenal glands are unremarkable.  The kidneys are unremarkable in appearance. There is no evidence of hydronephrosis. No renal or ureteral stones are seen. No perinephric stranding is appreciated.  No free fluid is identified. The small bowel is unremarkable in appearance. The stomach is within normal limits. No acute vascular abnormalities are seen.  The appendix is normal in caliber and contains air, without  evidence for appendicitis. The colon is unremarkable in appearance.  The bladder is relatively decompressed. Apparent bladder wall thickening likely reflects relative decompression. The prostate remains normal in size. No inguinal lymphadenopathy is seen.  No acute osseous abnormalities are identified. A chronic defect is noted at the left iliac wing.  IMPRESSION: 1. No evidence of traumatic injury to the chest, abdomen or pelvis. 2. Minimal bibasilar atelectasis noted; lungs otherwise clear. Bleb at the left lung apex.   Electronically Signed   By: Garald Balding M.D.   On: 01/31/2014 02:21   Ct Head Wo Contrast   (if New Onset Seizure And/or Head Trauma)  01/31/2014   CLINICAL DATA:  Patient was found passed out in the middle of the road today. Patient is semi unresponsive. Multiple abrasions and bruising to the face. Altered mental status.  EXAM: CT HEAD WITHOUT CONTRAST  CT MAXILLOFACIAL WITHOUT CONTRAST  CT CERVICAL SPINE WITHOUT CONTRAST  TECHNIQUE: Multidetector CT imaging of the head, cervical spine, and maxillofacial structures were performed using the standard protocol without intravenous contrast. Multiplanar CT image reconstructions of the cervical spine and maxillofacial structures were also generated.  COMPARISON:  09/14/2013 CT head face cervical.  FINDINGS: CT HEAD FINDINGS  Ventricles and sulci appear symmetrical. Subcutaneous hematoma at the soft tissues over the base of the skull posteriorly. No mass effect or midline shift. No abnormal extra-axial fluid collections. Gray-white matter junctions are distinct. Basal cisterns are not effaced. No evidence of acute intracranial hemorrhage. No depressed skull fractures. Opacification of mastoid air cells bilaterally, greater on the right.  CT MAXILLOFACIAL FINDINGS  The globes and extraocular muscles appear intact and symmetrical. Mucosal thickening in the right frontal sinus, bilateral ethmoid air cells, and sphenoid sinuses. No acute air-fluid  levels in the paranasal sinuses. Old appearing nasal bone fractures. The orbital rims, facial bones, zygomatic arches, pterygoid plates, mandibles, and temporomandibular joints appear intact. No displaced fractures identified. Multiple prior tooth extractions.  CT CERVICAL SPINE FINDINGS  Normal alignment of the cervical spine and facet joints. Postoperative changes with anterior fusion of C4 through C6. Degenerative changes at C3-4 and C6-7 levels. Degenerative changes in the cervical facet joints. No vertebral compression deformities. No prevertebral soft tissue swelling. C1-2 articulation appears intact. No focal bone lesion or bone destruction. Bone cortex and trabecular architecture appear intact. Soft tissue hematoma in the subcutaneous fat over the posterior neck.  IMPRESSION: No acute intracranial abnormalities.  Present inflammatory changes in the paranasal sinuses and mastoid air cells. No displaced orbital or facial fractures.  Postoperative changes of anterior fusion from C4 through C6. Degenerative changes in the cervical spine. No displaced fractures identified. Soft tissue hematoma over the posterior neck.   Electronically Signed   By: Lucienne Capers M.D.   On: 01/31/2014 02:06   Ct Chest Wo Contrast  01/31/2014   CLINICAL DATA:  Found unresponsive in middle of road. Concern for chest or abdominal injury. Initial encounter.  EXAM: CT CHEST, ABDOMEN AND PELVIS WITHOUT CONTRAST  TECHNIQUE: Multidetector CT imaging of the chest, abdomen and pelvis was performed following the standard protocol without IV contrast.  COMPARISON:  Lumbar spine radiographs performed 01/13/2014, and CT of the abdomen and pelvis performed 10/23/2007  FINDINGS: CT CHEST FINDINGS  Minimal bibasilar atelectasis is noted. The lungs are otherwise clear. A bleb is seen at the left lung apex. No focal consolidation, pleural effusion or pneumothorax is seen. There is no evidence of pulmonary parenchymal contusion. No masses are  seen.  The mediastinum is unremarkable appearance. There is no evidence of venous hemorrhage. No mediastinal lymphadenopathy is seen. No pericardial effusion is identified. The great vessels are grossly unremarkable in appearance. The visualized portions of thyroid gland are unremarkable. No axillary lymphadenopathy is seen.  There is no evidence of significant soft tissue injury along the chest wall.  No acute osseous abnormalities are identified. Chronic healed left posterior rib fractures are seen.  CT ABDOMEN AND PELVIS FINDINGS  No free air or free fluid is seen within the abdomen or pelvis. There is no evidence of solid or hollow organ injury.  The liver and spleen are unremarkable in appearance. The gallbladder is within normal limits. The pancreas and adrenal glands are unremarkable.  The kidneys are unremarkable in appearance. There is no evidence of hydronephrosis. No renal or ureteral stones are seen. No perinephric stranding is appreciated.  No free fluid is identified. The small bowel is unremarkable in appearance. The stomach is within normal limits. No acute vascular abnormalities are seen.  The appendix is normal in caliber and contains air, without evidence for appendicitis. The colon is unremarkable in appearance.  The bladder is relatively  decompressed. Apparent bladder wall thickening likely reflects relative decompression. The prostate remains normal in size. No inguinal lymphadenopathy is seen.  No acute osseous abnormalities are identified. A chronic defect is noted at the left iliac wing.  IMPRESSION: 1. No evidence of traumatic injury to the chest, abdomen or pelvis. 2. Minimal bibasilar atelectasis noted; lungs otherwise clear. Bleb at the left lung apex.   Electronically Signed   By: Garald Balding M.D.   On: 01/31/2014 02:21   Ct Cervical Spine Wo Contrast  01/31/2014   CLINICAL DATA:  Patient was found passed out in the middle of the road today. Patient is semi unresponsive. Multiple  abrasions and bruising to the face. Altered mental status.  EXAM: CT HEAD WITHOUT CONTRAST  CT MAXILLOFACIAL WITHOUT CONTRAST  CT CERVICAL SPINE WITHOUT CONTRAST  TECHNIQUE: Multidetector CT imaging of the head, cervical spine, and maxillofacial structures were performed using the standard protocol without intravenous contrast. Multiplanar CT image reconstructions of the cervical spine and maxillofacial structures were also generated.  COMPARISON:  09/14/2013 CT head face cervical.  FINDINGS: CT HEAD FINDINGS  Ventricles and sulci appear symmetrical. Subcutaneous hematoma at the soft tissues over the base of the skull posteriorly. No mass effect or midline shift. No abnormal extra-axial fluid collections. Gray-white matter junctions are distinct. Basal cisterns are not effaced. No evidence of acute intracranial hemorrhage. No depressed skull fractures. Opacification of mastoid air cells bilaterally, greater on the right.  CT MAXILLOFACIAL FINDINGS  The globes and extraocular muscles appear intact and symmetrical. Mucosal thickening in the right frontal sinus, bilateral ethmoid air cells, and sphenoid sinuses. No acute air-fluid levels in the paranasal sinuses. Old appearing nasal bone fractures. The orbital rims, facial bones, zygomatic arches, pterygoid plates, mandibles, and temporomandibular joints appear intact. No displaced fractures identified. Multiple prior tooth extractions.  CT CERVICAL SPINE FINDINGS  Normal alignment of the cervical spine and facet joints. Postoperative changes with anterior fusion of C4 through C6. Degenerative changes at C3-4 and C6-7 levels. Degenerative changes in the cervical facet joints. No vertebral compression deformities. No prevertebral soft tissue swelling. C1-2 articulation appears intact. No focal bone lesion or bone destruction. Bone cortex and trabecular architecture appear intact. Soft tissue hematoma in the subcutaneous fat over the posterior neck.  IMPRESSION: No acute  intracranial abnormalities.  Present inflammatory changes in the paranasal sinuses and mastoid air cells. No displaced orbital or facial fractures.  Postoperative changes of anterior fusion from C4 through C6. Degenerative changes in the cervical spine. No displaced fractures identified. Soft tissue hematoma over the posterior neck.   Electronically Signed   By: Lucienne Capers M.D.   On: 01/31/2014 02:06   Dg Shoulder Left  01/31/2014   CLINICAL DATA:  Found in middle of the road; left shoulder pain and lateral and posterior abrasions. Initial encounter.  EXAM: LEFT SHOULDER - 2+ VIEW  COMPARISON:  None.  FINDINGS: There is no evidence of fracture or dislocation. The left humeral head is seated within the glenoid fossa. The acromioclavicular joint is unremarkable in appearance. No significant soft tissue abnormalities are seen. The visualized portions of the left lung are clear. Apparent scattered debris is noted about the left shoulder.  IMPRESSION: 1. No evidence of fracture or dislocation. 2. Apparent scattered debris noted about the left shoulder.   Electronically Signed   By: Garald Balding M.D.   On: 01/31/2014 01:34   Dg Knee Complete 4 Views Left  01/31/2014   CLINICAL DATA:  Found in middle of  road. Acute onset of left knee pain, with anterior abrasion above the patella. Initial encounter.  EXAM: LEFT KNEE - COMPLETE 4+ VIEW  COMPARISON:  None.  FINDINGS: There is no evidence of fracture or dislocation. The joint spaces are preserved. No significant degenerative change is seen; the patellofemoral joint is grossly unremarkable in appearance.  No significant joint effusion is seen. The visualized soft tissues are normal in appearance.  IMPRESSION: No evidence of fracture or dislocation.   Electronically Signed   By: Garald Balding M.D.   On: 01/31/2014 01:35   Ct Maxillofacial Wo Cm  01/31/2014   CLINICAL DATA:  Patient was found passed out in the middle of the road today. Patient is semi  unresponsive. Multiple abrasions and bruising to the face. Altered mental status.  EXAM: CT HEAD WITHOUT CONTRAST  CT MAXILLOFACIAL WITHOUT CONTRAST  CT CERVICAL SPINE WITHOUT CONTRAST  TECHNIQUE: Multidetector CT imaging of the head, cervical spine, and maxillofacial structures were performed using the standard protocol without intravenous contrast. Multiplanar CT image reconstructions of the cervical spine and maxillofacial structures were also generated.  COMPARISON:  09/14/2013 CT head face cervical.  FINDINGS: CT HEAD FINDINGS  Ventricles and sulci appear symmetrical. Subcutaneous hematoma at the soft tissues over the base of the skull posteriorly. No mass effect or midline shift. No abnormal extra-axial fluid collections. Gray-white matter junctions are distinct. Basal cisterns are not effaced. No evidence of acute intracranial hemorrhage. No depressed skull fractures. Opacification of mastoid air cells bilaterally, greater on the right.  CT MAXILLOFACIAL FINDINGS  The globes and extraocular muscles appear intact and symmetrical. Mucosal thickening in the right frontal sinus, bilateral ethmoid air cells, and sphenoid sinuses. No acute air-fluid levels in the paranasal sinuses. Old appearing nasal bone fractures. The orbital rims, facial bones, zygomatic arches, pterygoid plates, mandibles, and temporomandibular joints appear intact. No displaced fractures identified. Multiple prior tooth extractions.  CT CERVICAL SPINE FINDINGS  Normal alignment of the cervical spine and facet joints. Postoperative changes with anterior fusion of C4 through C6. Degenerative changes at C3-4 and C6-7 levels. Degenerative changes in the cervical facet joints. No vertebral compression deformities. No prevertebral soft tissue swelling. C1-2 articulation appears intact. No focal bone lesion or bone destruction. Bone cortex and trabecular architecture appear intact. Soft tissue hematoma in the subcutaneous fat over the posterior  neck.  IMPRESSION: No acute intracranial abnormalities.  Present inflammatory changes in the paranasal sinuses and mastoid air cells. No displaced orbital or facial fractures.  Postoperative changes of anterior fusion from C4 through C6. Degenerative changes in the cervical spine. No displaced fractures identified. Soft tissue hematoma over the posterior neck.   Electronically Signed   By: Lucienne Capers M.D.   On: 01/31/2014 02:06    Microbiology: Recent Results (from the past 240 hour(s))  MRSA PCR Screening     Status: None   Collection Time: 02/14/14 12:04 AM  Result Value Ref Range Status   MRSA by PCR NEGATIVE NEGATIVE Final    Comment:        The GeneXpert MRSA Assay (FDA approved for NASAL specimens only), is one component of a comprehensive MRSA colonization surveillance program. It is not intended to diagnose MRSA infection nor to guide or monitor treatment for MRSA infections.      Labs: Basic Metabolic Panel:  Recent Labs Lab 02/13/14 2043 02/14/14 1132  NA 136 141  K 3.0* 3.7  CL 98 106  CO2 24 29  GLUCOSE 100* 112*  BUN 8 9  CREATININE 0.80 0.88  CALCIUM 9.1 8.8  MG 2.3 2.1   Liver Function Tests:  Recent Labs Lab 02/13/14 2043  AST 36  ALT 29  ALKPHOS 94  BILITOT 0.8  PROT 7.6  ALBUMIN 4.4   No results for input(s): LIPASE, AMYLASE in the last 168 hours. No results for input(s): AMMONIA in the last 168 hours. CBC:  Recent Labs Lab 02/13/14 2043  WBC 7.6  NEUTROABS 4.0  HGB 17.0  HCT 46.6  MCV 92.6  PLT 182   Cardiac Enzymes: No results for input(s): CKTOTAL, CKMB, CKMBINDEX, TROPONINI in the last 168 hours. BNP: BNP (last 3 results) No results for input(s): PROBNP in the last 8760 hours. CBG: No results for input(s): GLUCAP in the last 168 hours.     Signed:  Jacquette Canales  [facility] 02/14/2014, 5:58 PM

## 2014-02-15 ENCOUNTER — Encounter (HOSPITAL_COMMUNITY): Payer: Self-pay

## 2014-02-15 DIAGNOSIS — F332 Major depressive disorder, recurrent severe without psychotic features: Principal | ICD-10-CM

## 2014-02-15 DIAGNOSIS — R45851 Suicidal ideations: Secondary | ICD-10-CM

## 2014-02-15 DIAGNOSIS — T50902A Poisoning by unspecified drugs, medicaments and biological substances, intentional self-harm, initial encounter: Secondary | ICD-10-CM

## 2014-02-15 DIAGNOSIS — F1014 Alcohol abuse with alcohol-induced mood disorder: Secondary | ICD-10-CM

## 2014-02-15 DIAGNOSIS — T1491XA Suicide attempt, initial encounter: Secondary | ICD-10-CM | POA: Diagnosis present

## 2014-02-15 DIAGNOSIS — F1094 Alcohol use, unspecified with alcohol-induced mood disorder: Secondary | ICD-10-CM | POA: Insufficient documentation

## 2014-02-15 LAB — C-REACTIVE PROTEIN: CRP: 1.5 mg/dL — ABNORMAL HIGH (ref <0.60)

## 2014-02-15 LAB — URIC ACID: URIC ACID, SERUM: 5 mg/dL (ref 4.0–7.8)

## 2014-02-15 LAB — SEDIMENTATION RATE: Sed Rate: 13 mm/hr (ref 0–16)

## 2014-02-15 LAB — TSH: TSH: 2.141 u[IU]/mL (ref 0.350–4.500)

## 2014-02-15 MED ORDER — TRAZODONE HCL 100 MG PO TABS
100.0000 mg | ORAL_TABLET | Freq: Every day | ORAL | Status: DC
  Administered 2014-02-15 – 2014-02-19 (×6): 100 mg via ORAL
  Filled 2014-02-15: qty 3
  Filled 2014-02-15 (×11): qty 1

## 2014-02-15 MED ORDER — DIPHENHYDRAMINE HCL 25 MG PO CAPS
50.0000 mg | ORAL_CAPSULE | Freq: Four times a day (QID) | ORAL | Status: DC | PRN
  Administered 2014-02-15: 50 mg via ORAL
  Filled 2014-02-15: qty 2

## 2014-02-15 MED ORDER — COLCHICINE 0.6 MG PO TABS
0.6000 mg | ORAL_TABLET | Freq: Two times a day (BID) | ORAL | Status: DC
  Administered 2014-02-15 – 2014-02-20 (×12): 0.6 mg via ORAL
  Filled 2014-02-15 (×18): qty 1

## 2014-02-15 MED ORDER — ACETAMINOPHEN 325 MG PO TABS
650.0000 mg | ORAL_TABLET | Freq: Four times a day (QID) | ORAL | Status: DC | PRN
  Administered 2014-02-16 – 2014-02-19 (×2): 650 mg via ORAL
  Filled 2014-02-15 (×2): qty 2

## 2014-02-15 MED ORDER — ONDANSETRON 4 MG PO TBDP: 4.0000 mg | ORAL_TABLET | Freq: Four times a day (QID) | ORAL | Status: AC | PRN

## 2014-02-15 MED ORDER — PANTOPRAZOLE SODIUM 40 MG PO TBEC
40.0000 mg | DELAYED_RELEASE_TABLET | Freq: Every day | ORAL | Status: DC
  Administered 2014-02-15 – 2014-02-20 (×6): 40 mg via ORAL
  Filled 2014-02-15 (×9): qty 1

## 2014-02-15 MED ORDER — CHLORDIAZEPOXIDE HCL 25 MG PO CAPS
25.0000 mg | ORAL_CAPSULE | Freq: Four times a day (QID) | ORAL | Status: AC
  Administered 2014-02-15 – 2014-02-16 (×6): 25 mg via ORAL
  Filled 2014-02-15 (×6): qty 1

## 2014-02-15 MED ORDER — ADULT MULTIVITAMIN W/MINERALS CH
1.0000 | ORAL_TABLET | Freq: Every day | ORAL | Status: DC
  Administered 2014-02-15 – 2014-02-20 (×6): 1 via ORAL
  Filled 2014-02-15 (×9): qty 1

## 2014-02-15 MED ORDER — GABAPENTIN 300 MG PO CAPS
300.0000 mg | ORAL_CAPSULE | Freq: Three times a day (TID) | ORAL | Status: DC
  Administered 2014-02-15 – 2014-02-20 (×17): 300 mg via ORAL
  Filled 2014-02-15 (×15): qty 1
  Filled 2014-02-15: qty 9
  Filled 2014-02-15: qty 1
  Filled 2014-02-15: qty 9
  Filled 2014-02-15 (×3): qty 1
  Filled 2014-02-15: qty 9
  Filled 2014-02-15 (×9): qty 1

## 2014-02-15 MED ORDER — HYDROCORTISONE 1 % EX CREA
TOPICAL_CREAM | Freq: Two times a day (BID) | CUTANEOUS | Status: DC
  Administered 2014-02-15 – 2014-02-19 (×7): via TOPICAL
  Filled 2014-02-15: qty 28

## 2014-02-15 MED ORDER — CHLORDIAZEPOXIDE HCL 25 MG PO CAPS
25.0000 mg | ORAL_CAPSULE | Freq: Four times a day (QID) | ORAL | Status: AC | PRN
  Administered 2014-02-15: 25 mg via ORAL
  Filled 2014-02-15: qty 1

## 2014-02-15 MED ORDER — HYDROXYZINE HCL 25 MG PO TABS: 25.0000 mg | ORAL_TABLET | Freq: Four times a day (QID) | ORAL | Status: AC | PRN

## 2014-02-15 MED ORDER — PNEUMOCOCCAL VAC POLYVALENT 25 MCG/0.5ML IJ INJ
0.5000 mL | INJECTION | INTRAMUSCULAR | Status: AC
  Administered 2014-02-16: 0.5 mL via INTRAMUSCULAR

## 2014-02-15 MED ORDER — HYDROCHLOROTHIAZIDE 25 MG PO TABS
25.0000 mg | ORAL_TABLET | Freq: Every day | ORAL | Status: DC
  Administered 2014-02-15 – 2014-02-20 (×6): 25 mg via ORAL
  Filled 2014-02-15 (×9): qty 1

## 2014-02-15 MED ORDER — CHLORDIAZEPOXIDE HCL 25 MG PO CAPS
25.0000 mg | ORAL_CAPSULE | Freq: Three times a day (TID) | ORAL | Status: AC
  Administered 2014-02-16 – 2014-02-17 (×3): 25 mg via ORAL
  Filled 2014-02-15 (×3): qty 1

## 2014-02-15 MED ORDER — LOPERAMIDE HCL 2 MG PO CAPS: 2.0000 mg | ORAL_CAPSULE | ORAL | Status: AC | PRN

## 2014-02-15 MED ORDER — INFLUENZA VAC SPLIT QUAD 0.5 ML IM SUSY
0.5000 mL | PREFILLED_SYRINGE | INTRAMUSCULAR | Status: AC
  Administered 2014-02-16: 0.5 mL via INTRAMUSCULAR
  Filled 2014-02-15: qty 0.5

## 2014-02-15 MED ORDER — ALLOPURINOL 300 MG PO TABS
300.0000 mg | ORAL_TABLET | Freq: Every day | ORAL | Status: DC
  Administered 2014-02-15 – 2014-02-20 (×6): 300 mg via ORAL
  Filled 2014-02-15 (×9): qty 1

## 2014-02-15 MED ORDER — MAGNESIUM HYDROXIDE 400 MG/5ML PO SUSP: 30.0000 mL | Freq: Every day | ORAL | Status: DC | PRN

## 2014-02-15 MED ORDER — CHLORDIAZEPOXIDE HCL 25 MG PO CAPS
25.0000 mg | ORAL_CAPSULE | ORAL | Status: AC
  Administered 2014-02-17 – 2014-02-18 (×2): 25 mg via ORAL
  Filled 2014-02-15 (×2): qty 1

## 2014-02-15 MED ORDER — CHLORDIAZEPOXIDE HCL 25 MG PO CAPS
25.0000 mg | ORAL_CAPSULE | Freq: Every day | ORAL | Status: AC
  Administered 2014-02-19: 25 mg via ORAL
  Filled 2014-02-15: qty 1

## 2014-02-15 MED ORDER — ALUM & MAG HYDROXIDE-SIMETH 200-200-20 MG/5ML PO SUSP: 30.0000 mL | ORAL | Status: DC | PRN

## 2014-02-15 MED ORDER — NAPROXEN 500 MG PO TABS
500.0000 mg | ORAL_TABLET | Freq: Two times a day (BID) | ORAL | Status: DC
  Administered 2014-02-15 – 2014-02-20 (×12): 500 mg via ORAL
  Filled 2014-02-15 (×18): qty 1

## 2014-02-15 MED ORDER — VITAMIN B-1 100 MG PO TABS
100.0000 mg | ORAL_TABLET | Freq: Every day | ORAL | Status: DC
  Administered 2014-02-16 – 2014-02-20 (×5): 100 mg via ORAL
  Filled 2014-02-15 (×8): qty 1

## 2014-02-15 MED ORDER — THIAMINE HCL 100 MG/ML IJ SOLN: 100.0000 mg | Freq: Once | INTRAMUSCULAR | Status: DC

## 2014-02-15 MED ORDER — VENLAFAXINE HCL ER 150 MG PO CP24
150.0000 mg | ORAL_CAPSULE | Freq: Every day | ORAL | Status: DC
  Administered 2014-02-15 – 2014-02-20 (×6): 150 mg via ORAL
  Filled 2014-02-15 (×6): qty 1
  Filled 2014-02-15: qty 3
  Filled 2014-02-15 (×3): qty 1

## 2014-02-15 NOTE — [facility] Suicide Risk Assessment (Signed)
[facility] Admission Suicide Risk Assessment   Nursing information obtained from:    Demographic factors:   49 year old male  Current Mental Status:   See below Loss Factors:   recently found out SO was unfaithful Historical Factors:   History of depression and alcohol abuse  Risk Reduction Factors:   REsilience  Total Time spent with patient: 45 minutes Principal Problem: Depression, heavy and regular alcohol consumption, recent impulsive overdose Diagnosis:   Patient Active Problem List   Diagnosis Date Noted  . Alcohol-induced mood disorder [F10.94] 02/15/2014  . Suicide attempt [T14.91] 02/15/2014  . Severe recurrent major depression without psychotic features [F33.2] 02/15/2014  . Alcohol abuse with alcohol-induced mood disorder [F10.14] 02/15/2014  . Hypokalemia [E87.6] 02/14/2014  . Suicidal ideation [R45.851] 02/14/2014  . Overdose [T50.901A] 02/13/2014  . Altered mental state [R41.82] 02/13/2014  . HTN (hypertension) [I10] 02/13/2014     Continued Clinical Symptoms:  Alcohol Use Disorder Identification Test Final Score (AUDIT): 23 The "Alcohol Use Disorders Identification Test", Guidelines for Use in Primary Care, Second Edition.  World Pharmacologist [name]. Score between 0-7:  no or low risk or alcohol related problems. Score between 8-15:  moderate risk of alcohol related problems. Score between 16-19:  high risk of alcohol related problems. Score 20 or above:  warrants further diagnostic evaluation for alcohol dependence and treatment.   CLINICAL FACTORS:   worsening depression in the context of severe psychosocial stressors, alcohol dependence, S/P overdose   Musculoskeletal: Strength & Muscle Tone: within normal limits Gait & Station: normal Patient leans: N/A  Psychiatric Specialty Exam: Physical Exam  ROS  Blood pressure 132/62, pulse 78, temperature 97.4 F (36.3 C), temperature source Oral, resp. rate 16, height 6' 0.5" (1.842 m), weight 194 lb (87.998  kg).Body mass index is 25.94 kg/(m^2).  General Appearance: Fairly Groomed  Engineer, water::  Fair  Speech:  Normal Rate  Volume:  Decreased  Mood:  Depressed  Affect:  Constricted  Thought Process:  Goal Directed and Linear  Orientation:  Other:  fully alert and attentive, no delirium or confusion at this time  Thought Content:  no hallucinations,no delusions, ruminative about stressors  Suicidal Thoughts:  No at this time denies any thoughts of hurting self and  contracts for safety on unit  Denies any thoughts of hurting girlfriend or cousin.  Homicidal Thoughts:  No  Memory: Recent and Remote grossly intact  Judgement:  Fair  Insight:  Fair  Psychomotor Activity:  Normal- no psychomotor agitation or restlessness, minimal distal tremors at this time  Concentration:  Good  Recall:  Good  Fund of Knowledge:Good  Language: Good  Akathisia:  NA  Handed:  Right  AIMS (if indicated):     Assets:  Desire for Improvement Resilience  Sleep:  Number of Hours: 5.25  Cognition: WNL  ADL's: fair      COGNITIVE FEATURES THAT CONTRIBUTE TO RISK:  Closed-mindedness    SUICIDE RISK:   Moderate:  Frequent suicidal ideation with limited intensity, and duration, some specificity in terms of plans, no associated intent, good self-control, limited dysphoria/symptomatology, some risk factors present, and identifiable protective factors, including available and accessible social support.  PLAN OF CARE: Patient will be admitted to inpatient psychiatric unit for stabilization and safety. Will provide and encourage milieu participation. Provide medication management and maked adjustments as needed. Will also provide medication management to minimize risk of withdrawal  Will follow daily.    Medical Decision Making:  Review of Psycho-Social Stressors (1),  Review or order clinical lab tests (1), Established Problem, Worsening (2) and Review of Medication Regimen & Side Effects (2)  I certify that  inpatient services furnished can reasonably be expected to improve the patient's condition.   [facility], [place] 02/15/2014, 5:19 PM
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
## 2014-02-15 NOTE — Progress Notes (Signed)
D: Patient alert and cooperative. Pt detoxing from [place] c/o night sweats but does not relates it to [place] withdrawals. Pt denies any other withdrawals symptoms. Pt reports after discharge will move in with father and mother till he gets himself together. Pt reports desire to get on antabuse but will discuss with provider tomorrow. Pt denies SI/HI/AVH.   A: Medications administered as prescribed. Emotional support given and will continue to monitor pt's progress for stabilization.  R: Patient remains safe and complaint with medications. Pt stated "needs to stay sober for his grandchildren".

## 2014-02-15 NOTE — Progress Notes (Signed)
Pt did attend group this evening.

## 2014-02-15 NOTE — BHH Group Notes (Signed)
[facility] LCSW Aftercare Discharge Planning Group Note   02/15/2014 11:26 AM    Participation Quality:  Appropraite  Mood/Affect:  Appropriate  Depression Rating:  5  Anxiety Rating:  5  Thoughts of Suicide:  No  Will you contract for safety?   NA  Current AVH:  No  Plan for Discharge/Comments:  Patient attended discharge planning group and actively participated in group. He advised of having a home and outpatient follow up with [facility].  Suicide prevention education reviewed and SPE document provided.   Transportation Means: Patient has transportation.   Supports:  Patient has a limited support system.   Chessa Barrasso, Eulas Post
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
## 2014-02-15 NOTE — Plan of Care (Signed)
Problem: Alteration in mood & ability to function due to Goal: STG-Patient will comply with prescribed medication regimen (Patient will comply with prescribed medication regimen)  Outcome: Progressing Patient compliant with medication at this time however patient would like to speak to MD about Antabuse.

## 2014-02-15 NOTE — Progress Notes (Signed)
Patient ID: Frank Moses, male   DOB: 03-Aug-1965, 49 y.o.   MRN: [id number] PER STATE REGULATIONS 482.30  THIS CHART WAS REVIEWED FOR MEDICAL NECESSITY WITH RESPECT TO THE PATIENT'S ADMISSION/DURATION OF STAY.  NEXT REVIEW DATE: 02/18/14  Roma Schanz, RN, BSN CASE MANAGER

## 2014-02-15 NOTE — BHH Group Notes (Signed)
[facility] LCSW Group Therapy  Emotional Regulation 1:15 - 2: 30 PM        02/15/2014  .2:03 PM    Type of Therapy:  Group Therapy  Participation Level:  Appropriate  Participation Quality:  Appropriate  Affect:  Depressed  Cognitive:  Appropriate  Insight:  Developing/Improving  Engagement in Therapy:  Developing/Improving   Modes of Intervention:  Discussion Exploration Problem-Solving Supportive  Summary of Progress/Problems:  Group topic was emotional regulations.  Patient participated in the discussion and was able to identify an emotion that needed to regulated.  Patient advised he deals with a lot of anger and tends to act out violently.  He shared she is not the kind of person to take a lot of stuff from others.  Patient shared he knows it is important to walk away.  Frank Moses 02/15/2014 2:03 PM

## 2014-02-15 NOTE — Tx Team (Signed)
Initial Interdisciplinary Treatment Plan   PATIENT STRESSORS: Marital or family conflict Medication change or noncompliance Substance abuse   PATIENT STRENGTHS: General fund of knowledge Motivation for treatment/growth   PROBLEM LIST: Problem List/Patient Goals Date to be addressed Date deferred Reason deferred Estimated date of resolution  "work on my ETOH problem"      "work on my anger"      ETOH Dependence      Depression with suicidal gesture                                     DISCHARGE CRITERIA:  Improved stabilization in mood, thinking, and/or behavior Need for constant or close observation no longer present Withdrawal symptoms are absent or subacute and managed without 24-hour nursing intervention  PRELIMINARY DISCHARGE PLAN: Attend aftercare/continuing care group Attend 12-step recovery group Return to previous living arrangement  PATIENT/FAMIILY INVOLVEMENT: This treatment plan has been presented to and reviewed with the patient, CARMINO OCAIN, and/or family member, .  The patient and family have been given the opportunity to ask questions and make suggestions.  Migdalia Dk 02/15/2014, 1:28 AM
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
## 2014-02-16 DIAGNOSIS — F332 Major depressive disorder, recurrent severe without psychotic features: Secondary | ICD-10-CM | POA: Diagnosis not present

## 2014-02-16 LAB — GABAPENTIN LEVEL

## 2014-02-16 MED ORDER — ACAMPROSATE CALCIUM 333 MG PO TBEC
666.0000 mg | DELAYED_RELEASE_TABLET | Freq: Three times a day (TID) | ORAL | Status: DC
  Administered 2014-02-17 – 2014-02-20 (×11): 666 mg via ORAL
  Filled 2014-02-16 (×4): qty 2
  Filled 2014-02-16: qty 18
  Filled 2014-02-16 (×14): qty 2
  Filled 2014-02-16: qty 18
  Filled 2014-02-16: qty 2
  Filled 2014-02-16: qty 18
  Filled 2014-02-16 (×2): qty 2

## 2014-02-16 NOTE — BHH Group Notes (Signed)
  [place] LCSW Group Therapy  [employer] [facility] [employer] 1:15 - 2:30 PM  02/16/2014 4:03 PM   Type of Therapy:  Group Therapy  Participation Level: Minimal     Participation Quality:  Attentive  Affect:Appropriate.  Cognitive:  Appropriate  Insight:  Developing/Improving   Engagement in Therapy:  Developing/Improving   Modes of Intervention:  Discussion, Education, Exploration, Problem-Solving, Rapport Building, Support   Summary of Progress/Problems:   Patient was attentive to speaker from the Mental [employer] Association as he shared his story of dealing with mental health/substance abuse issues and overcoming it by working a recovery program. Patient made no comment on the presentation. Concha Pyo 02/16/2014 4:03 PM

## 2014-02-16 NOTE — Plan of Care (Signed)
Problem: Alteration in mood & ability to function due to Goal: STG-Pt will be introduced to the 12-step program of recovery (Patient will be introduced to the 12-step program of recovery and disease concept of addiction)  Outcome: Completed/Met Date Met:  02/16/14 Patient attended AA/NA group.

## 2014-02-16 NOTE — Progress Notes (Addendum)
[facility] MD Progress Note  02/16/2014 7:48 PM Frank Moses  MRN:  [id number] Subjective:   Patient states he is feeling better. He states he is no longer as depressed, and feels he has been able to " move on" from recent psychosocial stressors . He denies medication side effects. At this time he is not presenting with any severe alcohol withdrawal symptoms. He does report some cravings to drink, although a motivation to maintain sobriety. Objective: I have discussed case with treatment team and have met with patient. Patient is improved and today presents with an improved mood and range of affect. Behavior on unit in good control and going to some groups. We discussed mediations that may help with his efforts to maintain sobriety such as Antabuse, Campral, Naltrexone. He is interested in Campral. We reviewed side effect profile. Principal Problem:  Depression and Alcohol Dependence  Diagnosis:   Patient Active Problem List   Diagnosis Date Noted  . Alcohol-induced mood disorder [F10.94] 02/15/2014  . Suicide attempt [T14.91] 02/15/2014  . Severe recurrent major depression without psychotic features [F33.2] 02/15/2014  . Alcohol abuse with alcohol-induced mood disorder [F10.14] 02/15/2014  . Hypokalemia [E87.6] 02/14/2014  . Suicidal ideation [R45.851] 02/14/2014  . Overdose [T50.901A] 02/13/2014  . Altered mental state [R41.82] 02/13/2014  . HTN (hypertension) [I10] 02/13/2014   Total Time spent with patient: 25 minutes   Past Medical History:  Past Medical History  Diagnosis Date  . Hypertension   . Arthritis   . Bipolar 1 disorder   . Schizophrenia   . Depression   . Stroke   . Asthma   . Peptic ulcer     Past Surgical History  Procedure Laterality Date  . Cervical fusion    . Bil foot surgery     Family History: History reviewed. No pertinent family history. Social History:  History  Alcohol Use  . Yes     History  Drug Use No    History   Social History  .  Marital Status: Single    Spouse Name: N/A    Number of Children: N/A  . Years of Education: N/A   Social History Main Topics  . Smoking status: Current Every Day Smoker -- 1.00 packs/day    Types: Cigarettes  . Smokeless tobacco: None  . Alcohol Use: Yes  . Drug Use: No  . Sexual Activity: Yes    Birth Control/ Protection: None   Other Topics Concern  . None   Social History Narrative   Additional History:    Sleep: Fair  Appetite:  improved    Assessment:   Musculoskeletal: Strength & Muscle Tone: within normal limits- no significant tremors or diaphoresis, no psychomotor agitation or restlessness, vitals stable Gait & Station: normal Patient leans: N/A   Psychiatric Specialty Exam: Physical Exam  ROS  Blood pressure 127/78, pulse 88, temperature 97.7 F (36.5 C), temperature source Oral, resp. rate 18, height 6' 0.5" (1.842 m), weight 194 lb (87.998 kg).Body mass index is 25.94 kg/(m^2).  General Appearance: Fairly Groomed  Engineer, water::  Good  Speech:  Normal Rate  Volume:  Normal  Mood:  improved, less depressed  Affect:  Appropriate  Thought Process:  Goal Directed and Linear  Orientation:  Full (Time, Place, and Person)  Thought Content:  less ruminative, no psychotic symptoms  Suicidal Thoughts:  No at this time denies any thoughts of hurting self and  contracts for safety on unit   Homicidal Thoughts:  No- denies any thoughts  of hurting  GF or man he suspects she was being unfaithful with  Memory:  Recent and Remote grossly intact  Judgement:  Fair  Insight:  improved  Psychomotor Activity:  Normal  Concentration:  Good  Recall:  Good  Fund of Knowledge:Good  Language: Good  Akathisia:  Negative  Handed:  Right  AIMS (if indicated):     Assets:  Desire for Improvement Physical Health Resilience  ADL's:  Intact  Cognition: WNL  Sleep:  Number of Hours: 6     Current Medications: Current Facility-Administered Medications  Medication Dose  Route Frequency Provider Last Rate Last Dose  . acetaminophen (TYLENOL) tablet 650 mg  650 mg Oral Q6H PRN Laverle Hobby, PA-C   650 mg at 02/16/14 1458  . allopurinol (ZYLOPRIM) tablet 300 mg  300 mg Oral Daily Laverle Hobby, PA-C   300 mg at 02/16/14 4854  . alum & mag hydroxide-simeth (MAALOX/MYLANTA) 200-200-20 MG/5ML suspension 30 mL  30 mL Oral Q4H PRN Laverle Hobby, PA-C      . chlordiazePOXIDE (LIBRIUM) capsule 25 mg  25 mg Oral Q6H PRN Laverle Hobby, PA-C   25 mg at 02/15/14 0109  . chlordiazePOXIDE (LIBRIUM) capsule 25 mg  25 mg Oral TID Laverle Hobby, PA-C   25 mg at 02/16/14 1726   Followed by  . [START ON 02/17/2014] chlordiazePOXIDE (LIBRIUM) capsule 25 mg  25 mg Oral BH-qamhs Spencer E Simon, PA-C       Followed by  . [START ON 02/19/2014] chlordiazePOXIDE (LIBRIUM) capsule 25 mg  25 mg Oral Daily Laverle Hobby, PA-C      . colchicine tablet 0.6 mg  0.6 mg Oral BID Laverle Hobby, PA-C   0.6 mg at 02/16/14 1726  . diphenhydrAMINE (BENADRYL) capsule 50 mg  50 mg Oral Q6H PRN Laverle Hobby, PA-C   50 mg at 02/15/14 0110  . gabapentin (NEURONTIN) capsule 300 mg  300 mg Oral TID Laverle Hobby, PA-C   300 mg at 02/16/14 1726  . hydrochlorothiazide (HYDRODIURIL) tablet 25 mg  25 mg Oral Daily Laverle Hobby, PA-C   25 mg at 02/16/14 6270  . hydrocortisone cream 1 %   Topical BID Laverle Hobby, PA-C      . hydrOXYzine (ATARAX/VISTARIL) tablet 25 mg  25 mg Oral Q6H PRN Laverle Hobby, PA-C      . loperamide (IMODIUM) capsule 2-4 mg  2-4 mg Oral PRN Laverle Hobby, PA-C      . magnesium hydroxide (MILK OF MAGNESIA) suspension 30 mL  30 mL Oral Daily PRN Laverle Hobby, PA-C      . multivitamin with minerals tablet 1 tablet  1 tablet Oral Daily Laverle Hobby, PA-C   1 tablet at 02/16/14 3500  . naproxen (NAPROSYN) tablet 500 mg  500 mg Oral BID WC Laverle Hobby, PA-C   500 mg at 02/16/14 1726  . ondansetron (ZOFRAN-ODT) disintegrating tablet 4 mg  4 mg Oral Q6H PRN  Laverle Hobby, PA-C      . pantoprazole (PROTONIX) EC tablet 40 mg  40 mg Oral Daily Laverle Hobby, PA-C   40 mg at 02/16/14 0819  . thiamine (B-1) injection 100 mg  100 mg Intramuscular Once Laverle Hobby, PA-C   100 mg at 02/15/14 0045  . thiamine (VITAMIN B-1) tablet 100 mg  100 mg Oral Daily Laverle Hobby, PA-C   100 mg at 02/16/14 9381  .  traZODone (DESYREL) tablet 100 mg  100 mg Oral QHS Laverle Hobby, PA-C   100 mg at 02/15/14 2118  . venlafaxine XR (EFFEXOR-XR) 24 hr capsule 150 mg  150 mg Oral Daily Laverle Hobby, PA-C   150 mg at 02/16/14 4259    Lab Results:  Results for orders placed or performed during the hospital encounter of 02/14/14 (from the past 48 hour(s))  TSH     Status: None   Collection Time: 02/15/14  6:20 AM  Result Value Ref Range   TSH 2.141 0.350 - 4.500 uIU/mL    Comment: Performed at [facility]  Gabapentin level     Status: None   Collection Time: 02/15/14  6:30 AM  Result Value Ref Range   Gabapentin Lvl <0.5 mcg/mL    Comment: (NOTE) Reference ranges for Gabapentin: 2.7-4.1 mcg/mL (peak) following a single dose of [phone] mg/day. 4.0-8.5 mcg/mL (peak) following a multiple dose of [phone] mg/day administration. The reference range is evolving.  Seizure control has been observed at levels in excess of 4 mcg/mL. Performed at [facility]   Uric acid     Status: None   Collection Time: 02/15/14  6:30 AM  Result Value Ref Range   Uric Acid, Serum 5.0 4.0 - 7.8 mg/dL    Comment: Performed at [facility]  Sedimentation rate     Status: None   Collection Time: 02/15/14  6:30 AM  Result Value Ref Range   Sed Rate 13 0 - 16 mm/hr    Comment: Performed at [facility]  C-reactive protein     Status: Abnormal   Collection Time: 02/15/14  6:30 AM  Result Value Ref Range   CRP 1.5 (H) <0.60 mg/dL    Comment: Performed at [facility]    Physical Findings: AIMS: Facial and Oral  Movements Muscles of Facial Expression: None, normal Lips and Perioral Area: None, normal Jaw: None, normal Tongue: None, normal,Extremity Movements Upper (arms, wrists, hands, fingers): None, normal Lower (legs, knees, ankles, toes): None, normal, Trunk Movements Neck, shoulders, hips: None, normal, Overall Severity Severity of abnormal movements (highest score from questions above): None, normal Incapacitation due to abnormal movements: None, normal Patient's awareness of abnormal movements (rate only patient's report): No Awareness, Dental Status Current problems with teeth and/or dentures?: Yes Does patient usually wear dentures?: No  CIWA:  CIWA-Ar Total: 2 COWS:      Assessment- At this time patient is improved, he is less depressed, presenting with a fuller range of affect, and less ruminative about recent psychosocial stressors, denies SI or HI. No severe withdrawals at this time.   Treatment Plan Summary: Daily contact with patient to assess and evaluate symptoms and progress in treatment, Medication management, Plan continue inpatient treatment and continue medications as below Effexor XR 150 mgrs QDAY  Neurontin 300 mgrs TID Start Campral 666 mgrs TID Continue Librium  Detox protocol  Medical Decision Making:  Established Problem, Stable/Improving (1), Review or order clinical lab tests (1), Review of Last Therapy Session (1), Review of Medication Regimen & Side Effects (2) and Review of New Medication or Change in Dosage (2) Problem Points:  Established problem, stable/improving (1), Review of last therapy session (1) and Review of psycho-social stressors (1) Data Points:  Review of medication regiment & side effects (2) Review of new medications or change in dosage (2)    COBOS, FERNANDO 02/16/2014, 7:48 PM

## 2014-02-16 NOTE — Clinical Social Work Note (Addendum)
CSW contact [place] in [employer] office to determine if it would be appropriate to call Penelope Coop, patient long term girlfriend who has charges against him for communicating a threat.  Ms. Tobie Poet advised as long as patient has consented for [place] to speak to her it is appropriate.  Message left on Ms. [employer], [phone], to return CSW's call at her earliest convenience.

## 2014-02-16 NOTE — Clinical Social Work Note (Signed)
CSW received a call back from Penelope Coop, patient's girlfriend.  She advised of having a 50-B against the patient.  CSW apologized for calling and explained we were aware there were charges for communicating a threat but not aware of there being a 50-B.  Ms. Martie Round accepted the apology; call ended.

## 2014-02-16 NOTE — BHH Group Notes (Signed)
[place] Group Notes:  (Nursing/MHT/Case Management/Adjunct)  Date:  02/16/2014  Time:  10:14 AM  Type of Therapy:  Nurse Education  Participation Level:  Did Not Attend  Participation Quality:  Did not attend  Affect:  Did not attend  Cognitive:  Did not attend  Insight:  None  Engagement in Group:  None  Modes of Intervention:  Discussion and Education  Summary of Progress/Problems: The purpose of this group is to discuss short term and long term goals. A quick discussion of orientation is had for those that are new to the unit. The topic of the day is leisure and lifestyle changes.  Jayley Hustead E 02/16/2014, 10:14 AM

## 2014-02-16 NOTE — Progress Notes (Signed)
Patient ID: Frank Moses, male   DOB: [birth year], 49 y.o.   MRN: [id number]  DAR: Pt. Denies SI/HI and A/V Hallucinations to this writer and reports mild withdrawal symptoms. Patient reports he slept good last night, appetite is good, energy level is high, and concentration level is good. Patient rates his depression, anxiety, and hopelessness at 0/10 for the day. Patient does not report any pain or discomfort at this time. Support and encouragement provided to the patient. Scheduled medications administered per physician's orders. Patient is receptive and cooperative. Patient is seen in the milieu and is attending groups. Q15 minute checks are maintained for safety.

## 2014-02-16 NOTE — Progress Notes (Signed)
Recreation Therapy Notes  Animal-Assisted Activity/Therapy (AAA/T) Program Checklist/Progress Notes Patient Eligibility Criteria Checklist & Daily Group note for Rec Tx Intervention  Date: 01.21.2016 Time: 2:45pm Location: [address] [employer]   AAA/T Program Assumption of Risk Form signed by Patient/ or Parent Legal Guardian yes  Patient is free of allergies or sever asthma yes  Patient reports no fear of animals yes  Patient reports no history of cruelty to animals yes  Patient understands his/her participation is voluntary yes  Patient washes hands before animal contact yes  Patient washes hands after animal contact yes  Behavioral Response: Did not attend.   Laureen Ochs Viola Kinnick, LRT/CTRS  Zelina Jimerson L 02/16/2014 5:06 PM

## 2014-02-16 NOTE — BHH Suicide Risk Assessment (Signed)
[place] INPATIENT:  Family/Significant Other Suicide Prevention Education  Suicide Prevention Education:  Contact [place]; Penelope Coop, Girlfriend, [phone];  has been identified by the patient as the family member/significant other with whom the patient will be residing, and identified as the person(s) who will aid the patient in the event of a mental health crisis.  With written consent from the patient, two attempts were made to provide suicide prevention education, prior to and/or following the patient's discharge.  We were unsuccessful in providing suicide prevention education.  A suicide education pamphlet was given to the patient to share with family/significant other.  Date and time of first attempt: 02/16/14 at 8:52 AM Date and time of second attempt: 02/16/14 at [place], Eulas Post 02/16/2014, 1:01 PM

## 2014-02-16 NOTE — Progress Notes (Addendum)
D:Patient in the dayroom interacting with peers on approach.  Patient is bright on approach.  Patient states, "I had a great day."  Patient states he was stressed because he is having problems with his girlfriend.  Patient states he is not going to worry about that anymore.  Patient states he is going to go live with his parents at discharge.  Patient states, "I want to enjoy life and I want to get my self together."  Patient states his medications are helping him.  Patient denies SI/HI and denies AVH.   A: Staff to monitor Q 15 mins for safety.  Encouragement and support offered.  Scheduled medications administered per orders. R: Patient remains safe on the unit.  Patient attended group tonight.  Patient visible on the unit and interacting with peers.  Patient taking administered medications

## 2014-02-17 ENCOUNTER — Encounter (HOSPITAL_COMMUNITY): Payer: Self-pay | Admitting: Registered Nurse

## 2014-02-17 DIAGNOSIS — F329 Major depressive disorder, single episode, unspecified: Secondary | ICD-10-CM

## 2014-02-17 DIAGNOSIS — F1020 Alcohol dependence, uncomplicated: Secondary | ICD-10-CM

## 2014-02-17 MED ORDER — OXYCODONE-ACETAMINOPHEN 5-325 MG PO TABS
1.0000 | ORAL_TABLET | Freq: Once | ORAL | Status: AC
  Administered 2014-02-17: 1 via ORAL
  Filled 2014-02-17: qty 1

## 2014-02-17 MED ORDER — CLINDAMYCIN HCL 300 MG PO CAPS
300.0000 mg | ORAL_CAPSULE | Freq: Four times a day (QID) | ORAL | Status: DC
  Administered 2014-02-17 – 2014-02-20 (×11): 300 mg via ORAL
  Filled 2014-02-17: qty 18
  Filled 2014-02-17 (×4): qty 1
  Filled 2014-02-17: qty 2
  Filled 2014-02-17 (×4): qty 1
  Filled 2014-02-17: qty 2
  Filled 2014-02-17: qty 1
  Filled 2014-02-17: qty 18
  Filled 2014-02-17 (×2): qty 1
  Filled 2014-02-17: qty 2
  Filled 2014-02-17: qty 18
  Filled 2014-02-17 (×10): qty 1
  Filled 2014-02-17: qty 18
  Filled 2014-02-17: qty 1
  Filled 2014-02-17: qty 2

## 2014-02-17 NOTE — Progress Notes (Signed)
Patient ID: Frank Moses, male   DOB: 12-Mar-1965, 49 y.o.   MRN: [id number]  Received call from Caryl Pina, [place] stating patient c/o "boil" to his buttocks area. Patient ambulated slowly to 400 hall treatment room. Patient states he has had pain to his upper buttocks area since Wednesday. Pain has gotten worse today. Patient reports he has a history of "boils" but has never had one to the buttocks area.    On assessment, patient has an area of erythema to the left intergluteal cleft. It is tender to touch and is non-fluctuant. There is no drainage.    1. Will start on Clindamycin 300 mg PO Q6H. 2. Percocet 5/325 mg PO x 1 dose now for pain.   Serena Colonel, FNP-BC [place]

## 2014-02-17 NOTE — BHH Group Notes (Signed)
[facility] LCSW Aftercare Discharge Planning Group Note   02/17/2014 10:13 AM    Participation Quality:  Appropraite  Mood/Affect:  Appropriate  Depression Rating:  0  Anxiety Rating:  0  Thoughts of Suicide:  No  Will you contract for safety?   NA  Current AVH:  No  Plan for Discharge/Comments:  Patient attended discharge planning group and actively participated in group. Patient will follow up with [facility].  Suicide prevention education reviewed and SPE document provided.   Transportation Means: Patient has transportation.   Supports:  Patient has a support system.   Gable Odonohue, Eulas Post

## 2014-02-17 NOTE — Progress Notes (Signed)
Patient ID: Frank Moses, male   DOB: 1965/05/26, 49 y.o.   MRN: [id number] [facility] MD Progress Note  02/17/2014 11:32 AM DRAYKE GRABEL  MRN:  [id number] Subjective:   Patient states that he is feeling much better.  Patient states that he is sleeping and eating without difficult.  "When I was drinking like that I hardly ate like I was suppose to.  I'm doing much better now."  Objective: Chart reviewed, patient interviewed, and discussed with psychiatrist and nursing.  Patient states that he is tolerating medication without adverse effects.  At this time patient denies suicidal/homicidal ideation, psychosis, and paranoia.  Patient states that he is attending and participating in group sessions.    Principal Problem:  Depression and Alcohol Dependence  Diagnosis:   Patient Active Problem List   Diagnosis Date Noted  . Alcohol-induced mood disorder [F10.94] 02/15/2014  . Suicide attempt [T14.91] 02/15/2014  . Severe recurrent major depression without psychotic features [F33.2] 02/15/2014  . Alcohol abuse with alcohol-induced mood disorder [F10.14] 02/15/2014  . Hypokalemia [E87.6] 02/14/2014  . Suicidal ideation [R45.851] 02/14/2014  . Overdose [T50.901A] 02/13/2014  . Altered mental state [R41.82] 02/13/2014  . HTN (hypertension) [I10] 02/13/2014   Total Time spent with patient: 25 minutes   Past Medical History:  Past Medical History  Diagnosis Date  . Hypertension   . Arthritis   . Bipolar 1 disorder   . Schizophrenia   . Depression   . Stroke   . Asthma   . Peptic ulcer     Past Surgical History  Procedure Laterality Date  . Cervical fusion    . Bil foot surgery     Family History: History reviewed. No pertinent family history. Social History:  History  Alcohol Use  . Yes     History  Drug Use No    History   Social History  . Marital Status: Single    Spouse Name: N/A    Number of Children: N/A  . Years of Education: N/A   Social History Main Topics  . Smoking  status: Current Every Day Smoker -- 1.00 packs/day    Types: Cigarettes  . Smokeless tobacco: None  . Alcohol Use: Yes  . Drug Use: No  . Sexual Activity: Yes    Birth Control/ Protection: None   Other Topics Concern  . None   Social History Narrative   Additional History:    Sleep: Good  Appetite:  Good   Assessment:   Musculoskeletal: Strength & Muscle Tone: within normal limits- Gait & Station: normal Patient leans: N/A   Psychiatric Specialty Exam: Physical Exam  Constitutional: He is oriented to person, place, and time.  Neck: Normal range of motion.  Respiratory: Effort normal.  Musculoskeletal: Normal range of motion.  Neurological: He is alert and oriented to person, place, and time.    Review of Systems  Musculoskeletal:       States that gout left toe is better.   Psychiatric/Behavioral: Positive for depression (denies at this time) and substance abuse. Negative for suicidal ideas. Hallucinations: Denies. The patient is nervous/anxious (Denies at this time). Insomnia: Stable.   All other systems reviewed and are negative.   Blood pressure 125/78, pulse 89, temperature 97.1 F (36.2 C), temperature source Oral, resp. rate 18, height 6' 0.5" (1.842 m), weight 87.998 kg (194 lb).Body mass index is 25.94 kg/(m^2).  General Appearance: Casual and Fairly Groomed  Eye Contact::  Good  Speech:  Normal Rate  Volume:  Normal  Mood:  "I feel good"  Affect:  Appropriate  Thought Process:  Goal Directed and Linear  Orientation:  Full (Time, Place, and Person)  Thought Content:  less ruminative, no psychotic symptoms  Suicidal Thoughts:  No Denies suicidal ideation at this time  Homicidal Thoughts:  No   Memory:  Recent and Remote grossly intact  Judgement:  Fair  Insight:  improved  Psychomotor Activity:  Normal  Concentration:  Good  Recall:  Good  Fund of Knowledge:Good  Language: Good  Akathisia:  Negative  Handed:  Right  AIMS (if indicated):      Assets:  Desire for Improvement Physical Health Resilience  ADL's:  Intact  Cognition: WNL  Sleep:  Number of Hours: 6     Current Medications: Current Facility-Administered Medications  Medication Dose Route Frequency Provider Last Rate Last Dose  . acamprosate (CAMPRAL) tablet 666 mg  666 mg Oral TID WC Jenne Campus, MD   666 mg at 02/17/14 9563  . acetaminophen (TYLENOL) tablet 650 mg  650 mg Oral Q6H PRN Laverle Hobby, PA-C   650 mg at 02/16/14 1458  . allopurinol (ZYLOPRIM) tablet 300 mg  300 mg Oral Daily Laverle Hobby, PA-C   300 mg at 02/17/14 8756  . alum & mag hydroxide-simeth (MAALOX/MYLANTA) 200-200-20 MG/5ML suspension 30 mL  30 mL Oral Q4H PRN Laverle Hobby, PA-C      . chlordiazePOXIDE (LIBRIUM) capsule 25 mg  25 mg Oral Q6H PRN Laverle Hobby, PA-C   25 mg at 02/15/14 0109  . chlordiazePOXIDE (LIBRIUM) capsule 25 mg  25 mg Oral TID Laverle Hobby, PA-C   25 mg at 02/17/14 4332   Followed by  . chlordiazePOXIDE (LIBRIUM) capsule 25 mg  25 mg Oral BH-qamhs Laverle Hobby, PA-C       Followed by  . [START ON 02/19/2014] chlordiazePOXIDE (LIBRIUM) capsule 25 mg  25 mg Oral Daily Laverle Hobby, PA-C      . colchicine tablet 0.6 mg  0.6 mg Oral BID Laverle Hobby, PA-C   0.6 mg at 02/17/14 9518  . diphenhydrAMINE (BENADRYL) capsule 50 mg  50 mg Oral Q6H PRN Laverle Hobby, PA-C   50 mg at 02/15/14 0110  . gabapentin (NEURONTIN) capsule 300 mg  300 mg Oral TID Laverle Hobby, PA-C   300 mg at 02/17/14 0743  . hydrochlorothiazide (HYDRODIURIL) tablet 25 mg  25 mg Oral Daily Laverle Hobby, PA-C   25 mg at 02/17/14 0743  . hydrocortisone cream 1 %   Topical BID Laverle Hobby, PA-C      . hydrOXYzine (ATARAX/VISTARIL) tablet 25 mg  25 mg Oral Q6H PRN Laverle Hobby, PA-C      . loperamide (IMODIUM) capsule 2-4 mg  2-4 mg Oral PRN Laverle Hobby, PA-C      . magnesium hydroxide (MILK OF MAGNESIA) suspension 30 mL  30 mL Oral Daily PRN Laverle Hobby, PA-C       . multivitamin with minerals tablet 1 tablet  1 tablet Oral Daily Laverle Hobby, PA-C   1 tablet at 02/17/14 0743  . naproxen (NAPROSYN) tablet 500 mg  500 mg Oral BID WC Laverle Hobby, PA-C   500 mg at 02/17/14 0743  . ondansetron (ZOFRAN-ODT) disintegrating tablet 4 mg  4 mg Oral Q6H PRN Laverle Hobby, PA-C      . pantoprazole (PROTONIX) EC tablet 40 mg  40 mg Oral Daily Frederico Hamman  E Simon, PA-C   40 mg at 02/17/14 0743  . thiamine (B-1) injection 100 mg  100 mg Intramuscular Once Laverle Hobby, PA-C   100 mg at 02/15/14 0045  . thiamine (VITAMIN B-1) tablet 100 mg  100 mg Oral Daily Laverle Hobby, PA-C   100 mg at 02/17/14 7482  . traZODone (DESYREL) tablet 100 mg  100 mg Oral QHS Laverle Hobby, PA-C   100 mg at 02/16/14 2258  . venlafaxine XR (EFFEXOR-XR) 24 hr capsule 150 mg  150 mg Oral Daily Laverle Hobby, PA-C   150 mg at 02/17/14 7078    Lab Results:  No results found for this or any previous visit (from the past 48 hour(s)).  Physical Findings: AIMS: Facial and Oral Movements Muscles of Facial Expression: None, normal Lips and Perioral Area: None, normal Jaw: None, normal Tongue: None, normal,Extremity Movements Upper (arms, wrists, hands, fingers): None, normal Lower (legs, knees, ankles, toes): None, normal, Trunk Movements Neck, shoulders, hips: None, normal, Overall Severity Severity of abnormal movements (highest score from questions above): None, normal Incapacitation due to abnormal movements: None, normal Patient's awareness of abnormal movements (rate only patient's report): No Awareness, Dental Status Current problems with teeth and/or dentures?: Yes Does patient usually wear dentures?: No  CIWA:  CIWA-Ar Total: 2 COWS:      Assessment- At this time patient is improved, he is less depressed, presenting with a fuller range of affect, and less ruminative about recent psychosocial stressors, denies SI or HI. No severe withdrawals at this time.    Treatment Plan Summary: Daily contact with patient to assess and evaluate symptoms and progress in treatment, Medication management, Plan continue inpatient treatment and continue medications as below Effexor XR 150 mgrs QDAY  Neurontin 300 mgrs TID Start Campral 666 mgrs TID Continue Librium  Detox protocol  Will continue with current treatment plan and no changes at this time   Medical Decision Making:  Established Problem, Stable/Improving (1), Review or order clinical lab tests (1), Review of Last Therapy Session (1), Review of Medication Regimen & Side Effects (2) and Review of New Medication or Change in Dosage (2) Problem Points:  Established problem, stable/improving (1), Review of last therapy session (1) and Review of psycho-social stressors (1) Data Points:  Review of medication regiment & side effects (2) Review of new medications or change in dosage (2)  Rankin, Shuvon, FNP-BC 02/17/2014, 11:32 AM

## 2014-02-17 NOTE — Progress Notes (Signed)
[facility] Group Notes:  (Nursing/MHT/Case Management/Adjunct)  Date:  02/17/2014  Time:  9:58 PM  Type of Therapy:  Psychoeducational Skills  Participation Level:  Minimal  Participation Quality:  Monopolizing and Resistant  Affect:  Angry  Cognitive:  Lacking  Insight:  Limited  Engagement in Group:  Off Topic and Resistant  Modes of Intervention:  Reality Testing  Summary of Progress/Problems: The patient described his day as having been "miserable" overall. He stated that he slept for much of the day and that he experienced a lot of pain from his boil. The patient states that he needs to go to the emergency room tonight and that he is not willing to wait till "tomorrow or next week". The patient became upset with his Pryor Curia after he was told that he would have to talk to his nurse about this matter and this Pryor Curia moved on to the next patient. The patient was unable to come up with a coping skill.    Archie Balboa S 02/17/2014, 9:58 PM

## 2014-02-17 NOTE — Progress Notes (Signed)
D: Patient is A&Ox4, denies SI/HI, A/V hallucinations, pleasant on approach.  Patient states he feels good, plans on going to groups, everything is going well except he has a spot on his buttocks that may either be a boil or part of his allergic reaction from prior to admission.  A: Medications given to patient as scheduled and prn. Emotional support given prn. Continue q15 minute checks. R: Patient remains safe and verbalizes intent to let staff know if he feels like hurting himself or anyone else. Patient states he is going to talk to the doctor about his buttocks.   Sarea Fyfe, Thornton Dales

## 2014-02-17 NOTE — Progress Notes (Signed)
D:Patient in his bed on approach.  Patient states, "I have boil on my butt and it is painful."  Patient states it got worse today.  Patient told the tech on the hal that if no one was going to do anything about it the he was going to call 911 so they could take him to the emergency room.  Writer looked at area and there is some redness,  Manus Gunning NP notified and she can to look at it and new orders received.  Patient irritable tonight but he states it is due to his pain.  Patient denies SI/HI and denies AVH. A: Staff to monitor Q 15 mins for safety.  Encouragement and support offered.  Scheduled medications administered per orders.   R: Patient remains safe on the unit.  Patient attended group tonight.  Patient visible on the unit and interacting with peers.  Patient taking administered medications.

## 2014-02-17 NOTE — Plan of Care (Signed)
Problem: Diagnosis: Increased Risk For Suicide Attempt Goal: LTG-Patient Will Show Positive Response to Medication LTG (by discharge) : Patient will show positive response to medication and will participate in the development of the discharge plan.  Outcome: Progressing Patient states he has been taking his medications and states he is feeling much better.  Patient states he is going to continue his meds when he is discharged.

## 2014-02-17 NOTE — Tx Team (Signed)
Interdisciplinary Treatment Plan Update   Date Reviewed:  02/17/2014  Time Reviewed:  10:14 AM  Progress in Treatment:   Attending groups: Yes Participating in groups: Yes Taking medication as prescribed: Yes  Tolerating medication: Yes Family/Significant other contact made: Yes, but ended contact due to 50B being in place. Patient understands diagnosis: Yes  Discussing patient identified problems/goals with staff: Yes Medical problems stabilized or resolved: Yes Denies suicidal/homicidal ideation: Yes Patient has not harmed self or others: Yes  For review of initial/current patient goals, please see plan of care.  Estimated Length of Stay:  3-4 days  Reasons for Continued Hospitalization:  Anxiety Depression Medication stabilization   New Problems/Goals identified:    Discharge Plan or Barriers:   Home with outpatient follow up with [facility]  Additional Comments:  Continue medication stabilization  Patient and CSW reviewed patient's identified goals and treatment plan.  Patient verbalized understanding and agreed to treatment plan.   Attendees:  Patient:  02/17/2014 10:14 AM   Signature:  Gabriel Earing, MD 02/17/2014 10:14 AM  Signature: Darrol Angel, RN  02/17/2014 10:14 AM  Signature:  Marcella Dubs, RN 02/17/2014 10:14 AM  Signature: Hans Eden,  RN 02/17/2014 10:14 AM  Signature:   02/17/2014 10:14 AM  Signature:  Joette Catching, LCSW 02/17/2014 10:14 AM   02/17/2014 10:14 AM   02/17/2014 10:14 AM   02/17/2014 10:14 AM   02/17/2014  10:14 AM   02/17/2014  10:14 AM   02/17/2014  10:14 AM    Scribe for Treatment Team:   Joette Catching,  02/17/2014 10:14 AM

## 2014-02-17 NOTE — Progress Notes (Signed)
Patient denied SI and HI, contracts for safety.  Denied A/V hallucinations.  Patient complained of boil and plans to see MD after discharge.  Patient sleeping after dinner.

## 2014-02-18 ENCOUNTER — Encounter (HOSPITAL_COMMUNITY): Payer: Self-pay | Admitting: Emergency Medicine

## 2014-02-18 ENCOUNTER — Inpatient Hospital Stay (HOSPITAL_COMMUNITY): Payer: Medicare HMO

## 2014-02-18 DIAGNOSIS — L0591 Pilonidal cyst without abscess: Secondary | ICD-10-CM | POA: Insufficient documentation

## 2014-02-18 DIAGNOSIS — K611 Rectal abscess: Secondary | ICD-10-CM | POA: Diagnosis present

## 2014-02-18 LAB — CBC WITH DIFFERENTIAL/PLATELET
Basophils Absolute: 0 10*3/uL (ref 0.0–0.1)
Basophils Relative: 0 % (ref 0–1)
EOS ABS: 0.2 10*3/uL (ref 0.0–0.7)
Eosinophils Relative: 2 % (ref 0–5)
HCT: 38.3 % — ABNORMAL LOW (ref 39.0–52.0)
Hemoglobin: 13.7 g/dL (ref 13.0–17.0)
LYMPHS ABS: 1.6 10*3/uL (ref 0.7–4.0)
Lymphocytes Relative: 17 % (ref 12–46)
MCH: 33.2 pg (ref 26.0–34.0)
MCHC: 35.8 g/dL (ref 30.0–36.0)
MCV: 92.7 fL (ref 78.0–100.0)
Monocytes Absolute: 1.1 10*3/uL — ABNORMAL HIGH (ref 0.1–1.0)
Monocytes Relative: 11 % (ref 3–12)
NEUTROS ABS: 6.7 10*3/uL (ref 1.7–7.7)
Neutrophils Relative %: 70 % (ref 43–77)
PLATELETS: 170 10*3/uL (ref 150–400)
RBC: 4.13 MIL/uL — AB (ref 4.22–5.81)
RDW: 12.4 % (ref 11.5–15.5)
WBC: 9.7 10*3/uL (ref 4.0–10.5)

## 2014-02-18 LAB — I-STAT CHEM 8, ED
BUN: 7 mg/dL (ref 6–23)
CALCIUM ION: 1.12 mmol/L (ref 1.12–1.23)
CHLORIDE: 101 mmol/L (ref 96–112)
Creatinine, Ser: 0.9 mg/dL (ref 0.50–1.35)
GLUCOSE: 194 mg/dL — AB (ref 70–99)
HEMATOCRIT: 40 % (ref 39.0–52.0)
Hemoglobin: 13.6 g/dL (ref 13.0–17.0)
Potassium: 3.6 mmol/L (ref 3.5–5.1)
SODIUM: 138 mmol/L (ref 135–145)
TCO2: 21 mmol/L (ref 0–100)

## 2014-02-18 MED ORDER — IOHEXOL 300 MG/ML  SOLN
100.0000 mL | Freq: Once | INTRAMUSCULAR | Status: AC | PRN
  Administered 2014-02-18: 100 mL via INTRAVENOUS

## 2014-02-18 MED ORDER — IOHEXOL 300 MG/ML  SOLN: 100.0000 mL | Freq: Once | INTRAMUSCULAR | Status: AC | PRN

## 2014-02-18 MED ORDER — TRAMADOL HCL 50 MG PO TABS
50.0000 mg | ORAL_TABLET | Freq: Three times a day (TID) | ORAL | Status: DC | PRN
  Administered 2014-02-18 – 2014-02-20 (×3): 50 mg via ORAL
  Filled 2014-02-18 (×3): qty 1

## 2014-02-18 MED ORDER — LIDOCAINE-EPINEPHRINE (PF) 2 %-1:200000 IJ SOLN
10.0000 mL | Freq: Once | INTRAMUSCULAR | Status: AC
  Administered 2014-02-18: 10 mL
  Filled 2014-02-18: qty 20

## 2014-02-18 MED ORDER — TRAMADOL HCL 50 MG PO TABS: 50.0000 mg | ORAL_TABLET | Freq: Three times a day (TID) | ORAL | Status: DC

## 2014-02-18 MED ORDER — SULFAMETHOXAZOLE-TRIMETHOPRIM 800-160 MG PO TABS: 1.0000 | ORAL_TABLET | Freq: Two times a day (BID) | ORAL | Status: DC

## 2014-02-18 NOTE — ED Notes (Signed)
Transportation being arranged for patient and his [facility] sitter.

## 2014-02-18 NOTE — [facility] Group Notes (Signed)
The focus of this group is to educate the patient on the purpose and policies of crisis stabilization and provide a format to answer questions about their admission.  The group details unit policies and expectations of patients while admitted.  Patient did not attend 0900 nurse education orientation group this morning.  Patient stayed in bed.   

## 2014-02-18 NOTE — ED Provider Notes (Signed)
CSN: [id number]     Arrival date & time 02/18/14  1908 History   First MD Initiated Contact with Patient 02/18/14 2029     Chief Complaint  Patient presents with  . Abscess     (Consider location/radiation/quality/duration/timing/severity/associated sxs/prior Treatment) HPI Comments: Patient presents to the emergency department from behavioral health with chief complaint of rectal abscess. He states that he noticed the abscess about 3 or 4 days ago. States that it has been progressively worsening. States pain is moderate to severe. It is worsened with palpation, walking, and sitting. He has not taken anything to alleviate the symptoms. The psychiatry team from behavioral health consulted the hospitalist team, and it was recommended that the patient comes emergency department for CT imaging of his pelvis. He denies any fevers, chills, nausea, vomiting. He states that he has had some night sweats. Denies any other complaints at this time.  The history is provided by the patient. No language interpreter was used.    Past Medical History  Diagnosis Date  . Hypertension   . Arthritis   . Bipolar 1 disorder   . Schizophrenia   . Depression   . Stroke   . Asthma   . Peptic ulcer    Past Surgical History  Procedure Laterality Date  . Cervical fusion    . Bil foot surgery     History reviewed. No pertinent family history. History  Substance Use Topics  . Smoking status: Current Every Day Smoker -- 1.00 packs/day    Types: Cigarettes  . Smokeless tobacco: Not on file  . Alcohol Use: Yes    Review of Systems  Constitutional: Negative for fever and chills.  Respiratory: Negative for shortness of breath.   Cardiovascular: Negative for chest pain.  Gastrointestinal: Negative for nausea, vomiting, diarrhea and constipation.  Genitourinary: Negative for dysuria.  Skin: Positive for wound.  All other systems reviewed and are negative.     Allergies  Shrimp; Orange fruit; and  Tomato  Home Medications   Prior to Admission medications   Medication Sig Start Date End Date Taking? Authorizing Provider  allopurinol (ZYLOPRIM) 300 MG tablet Take 300 mg by mouth daily. 09/09/13   Historical Provider, MD  Aspirin-Salicylamide-Caffeine (BC HEADACHE) 325-95-16 MG TABS Take 1 packet by mouth every 6 (six) hours as needed (for pain).    Historical Provider, MD  diphenhydrAMINE (BENADRYL) 25 MG tablet Take 1 tablet (25 mg total) by mouth every 6 (six) hours as needed for itching. 02/14/14   Kathie Dike, MD  gabapentin (NEURONTIN) 300 MG capsule Take 300 mg by mouth 3 (three) times daily.    Historical Provider, MD  hydrochlorothiazide (HYDRODIURIL) 25 MG tablet Take 25 mg by mouth daily.    Historical Provider, MD  hydrocortisone cream 1 % Apply topically 2 (two) times daily. 02/14/14   Kathie Dike, MD  pantoprazole (PROTONIX) 40 MG tablet Take 40 mg by mouth daily. 09/09/13   Historical Provider, MD  traZODone (DESYREL) 100 MG tablet Take 100 mg by mouth at bedtime.    Historical Provider, MD  venlafaxine (EFFEXOR-XR) 150 MG 24 hr capsule Take 150 mg by mouth daily.    Historical Provider, MD   BP 123/73 mmHg  Pulse 112  Temp(Src) 98.4 F (36.9 C) (Oral)  Resp 16  Ht 6\' 2"  (1.88 m)  Wt 210 lb (95.255 kg)  BMI 26.95 kg/m2  SpO2 97% Physical Exam  Constitutional: He is oriented to person, place, and time. He appears well-developed and well-nourished.  HENT:  Head: Normocephalic and atraumatic.  Eyes: Conjunctivae and EOM are normal. Pupils are equal, round, and reactive to light. Right eye exhibits no discharge. Left eye exhibits no discharge. No scleral icterus.  Neck: Normal range of motion. Neck supple. No JVD present.  Cardiovascular: Normal rate, regular rhythm and normal heart sounds.  Exam reveals no gallop and no friction rub.   No murmur heard. Pulmonary/Chest: Effort normal and breath sounds normal. No respiratory distress. He has no wheezes. He has no  rales. He exhibits no tenderness.  Abdominal: Soft. He exhibits no distension and no mass. There is no tenderness. There is no rebound and no guarding.  Genitourinary:  Moderate, 5 x 3 cm abscess at the rectum, no surrounding erythema  Musculoskeletal: Normal range of motion. He exhibits no edema or tenderness.  Neurological: He is alert and oriented to person, place, and time.  Skin: Skin is warm and dry.  Psychiatric: He has a normal mood and affect. His behavior is normal. Judgment and thought content normal.  Nursing note and vitals reviewed.   ED Course  Procedures (including critical care time) Results for orders placed or performed during the hospital encounter of 02/14/14  Gabapentin level  Result Value Ref Range   Gabapentin Lvl <0.5 mcg/mL  Uric acid  Result Value Ref Range   Uric Acid, Serum 5.0 4.0 - 7.8 mg/dL  Sedimentation rate  Result Value Ref Range   Sed Rate 13 0 - 16 mm/hr  TSH  Result Value Ref Range   TSH 2.141 0.350 - 4.500 uIU/mL  C-reactive protein  Result Value Ref Range   CRP 1.5 (H) <0.60 mg/dL  CBC with Differential/Platelet  Result Value Ref Range   WBC 9.7 4.0 - 10.5 K/uL   RBC 4.13 (L) 4.22 - 5.81 MIL/uL   Hemoglobin 13.7 13.0 - 17.0 g/dL   HCT 38.3 (L) 39.0 - 52.0 %   MCV 92.7 78.0 - 100.0 fL   MCH 33.2 26.0 - 34.0 pg   MCHC 35.8 30.0 - 36.0 g/dL   RDW 12.4 11.5 - 15.5 %   Platelets 170 150 - 400 K/uL   Neutrophils Relative % 70 43 - 77 %   Neutro Abs 6.7 1.7 - 7.7 K/uL   Lymphocytes Relative 17 12 - 46 %   Lymphs Abs 1.6 0.7 - 4.0 K/uL   Monocytes Relative 11 3 - 12 %   Monocytes Absolute 1.1 (H) 0.1 - 1.0 K/uL   Eosinophils Relative 2 0 - 5 %   Eosinophils Absolute 0.2 0.0 - 0.7 K/uL   Basophils Relative 0 0 - 1 %   Basophils Absolute 0.0 0.0 - 0.1 K/uL  I-stat chem 8, ed  Result Value Ref Range   Sodium 138 135 - 145 mmol/L   Potassium 3.6 3.5 - 5.1 mmol/L   Chloride 101 96 - 112 mmol/L   BUN 7 6 - 23 mg/dL   Creatinine, Ser  0.90 0.50 - 1.35 mg/dL   Glucose, Bld 194 (H) 70 - 99 mg/dL   Calcium, Ion 1.12 1.12 - 1.23 mmol/L   TCO2 21 0 - 100 mmol/L   Hemoglobin 13.6 13.0 - 17.0 g/dL   HCT 40.0 39.0 - 52.0 %   Ct Abdomen Pelvis Wo Contrast  01/31/2014   CLINICAL DATA:  Found unresponsive in middle of road. Concern for chest or abdominal injury. Initial encounter.  EXAM: CT CHEST, ABDOMEN AND PELVIS WITHOUT CONTRAST  TECHNIQUE: Multidetector CT imaging of the chest, abdomen  and pelvis was performed following the standard protocol without IV contrast.  COMPARISON:  Lumbar spine radiographs performed 01/13/2014, and CT of the abdomen and pelvis performed 10/23/2007  FINDINGS: CT CHEST FINDINGS  Minimal bibasilar atelectasis is noted. The lungs are otherwise clear. A bleb is seen at the left lung apex. No focal consolidation, pleural effusion or pneumothorax is seen. There is no evidence of pulmonary parenchymal contusion. No masses are seen.  The mediastinum is unremarkable appearance. There is no evidence of venous hemorrhage. No mediastinal lymphadenopathy is seen. No pericardial effusion is identified. The great vessels are grossly unremarkable in appearance. The visualized portions of thyroid gland are unremarkable. No axillary lymphadenopathy is seen.  There is no evidence of significant soft tissue injury along the chest wall.  No acute osseous abnormalities are identified. Chronic healed left posterior rib fractures are seen.  CT ABDOMEN AND PELVIS FINDINGS  No free air or free fluid is seen within the abdomen or pelvis. There is no evidence of solid or hollow organ injury.  The liver and spleen are unremarkable in appearance. The gallbladder is within normal limits. The pancreas and adrenal glands are unremarkable.  The kidneys are unremarkable in appearance. There is no evidence of hydronephrosis. No renal or ureteral stones are seen. No perinephric stranding is appreciated.  No free fluid is identified. The small bowel is  unremarkable in appearance. The stomach is within normal limits. No acute vascular abnormalities are seen.  The appendix is normal in caliber and contains air, without evidence for appendicitis. The colon is unremarkable in appearance.  The bladder is relatively decompressed. Apparent bladder wall thickening likely reflects relative decompression. The prostate remains normal in size. No inguinal lymphadenopathy is seen.  No acute osseous abnormalities are identified. A chronic defect is noted at the left iliac wing.  IMPRESSION: 1. No evidence of traumatic injury to the chest, abdomen or pelvis. 2. Minimal bibasilar atelectasis noted; lungs otherwise clear. Bleb at the left lung apex.   Electronically Signed   By: Garald Balding M.D.   On: 01/31/2014 02:21   Ct Head Wo Contrast   (if New Onset Seizure And/or Head Trauma)  01/31/2014   CLINICAL DATA:  Patient was found passed out in the middle of the road today. Patient is semi unresponsive. Multiple abrasions and bruising to the face. Altered mental status.  EXAM: CT HEAD WITHOUT CONTRAST  CT MAXILLOFACIAL WITHOUT CONTRAST  CT CERVICAL SPINE WITHOUT CONTRAST  TECHNIQUE: Multidetector CT imaging of the head, cervical spine, and maxillofacial structures were performed using the standard protocol without intravenous contrast. Multiplanar CT image reconstructions of the cervical spine and maxillofacial structures were also generated.  COMPARISON:  09/14/2013 CT head face cervical.  FINDINGS: CT HEAD FINDINGS  Ventricles and sulci appear symmetrical. Subcutaneous hematoma at the soft tissues over the base of the skull posteriorly. No mass effect or midline shift. No abnormal extra-axial fluid collections. Gray-white matter junctions are distinct. Basal cisterns are not effaced. No evidence of acute intracranial hemorrhage. No depressed skull fractures. Opacification of mastoid air cells bilaterally, greater on the right.  CT MAXILLOFACIAL FINDINGS  The globes and  extraocular muscles appear intact and symmetrical. Mucosal thickening in the right frontal sinus, bilateral ethmoid air cells, and sphenoid sinuses. No acute air-fluid levels in the paranasal sinuses. Old appearing nasal bone fractures. The orbital rims, facial bones, zygomatic arches, pterygoid plates, mandibles, and temporomandibular joints appear intact. No displaced fractures identified. Multiple prior tooth extractions.  CT CERVICAL SPINE FINDINGS  Normal alignment of the cervical spine and facet joints. Postoperative changes with anterior fusion of C4 through C6. Degenerative changes at C3-4 and C6-7 levels. Degenerative changes in the cervical facet joints. No vertebral compression deformities. No prevertebral soft tissue swelling. C1-2 articulation appears intact. No focal bone lesion or bone destruction. Bone cortex and trabecular architecture appear intact. Soft tissue hematoma in the subcutaneous fat over the posterior neck.  IMPRESSION: No acute intracranial abnormalities.  Present inflammatory changes in the paranasal sinuses and mastoid air cells. No displaced orbital or facial fractures.  Postoperative changes of anterior fusion from C4 through C6. Degenerative changes in the cervical spine. No displaced fractures identified. Soft tissue hematoma over the posterior neck.   Electronically Signed   By: Lucienne Capers M.D.   On: 01/31/2014 02:06   Ct Chest Wo Contrast  01/31/2014   CLINICAL DATA:  Found unresponsive in middle of road. Concern for chest or abdominal injury. Initial encounter.  EXAM: CT CHEST, ABDOMEN AND PELVIS WITHOUT CONTRAST  TECHNIQUE: Multidetector CT imaging of the chest, abdomen and pelvis was performed following the standard protocol without IV contrast.  COMPARISON:  Lumbar spine radiographs performed 01/13/2014, and CT of the abdomen and pelvis performed 10/23/2007  FINDINGS: CT CHEST FINDINGS  Minimal bibasilar atelectasis is noted. The lungs are otherwise clear. A bleb is  seen at the left lung apex. No focal consolidation, pleural effusion or pneumothorax is seen. There is no evidence of pulmonary parenchymal contusion. No masses are seen.  The mediastinum is unremarkable appearance. There is no evidence of venous hemorrhage. No mediastinal lymphadenopathy is seen. No pericardial effusion is identified. The great vessels are grossly unremarkable in appearance. The visualized portions of thyroid gland are unremarkable. No axillary lymphadenopathy is seen.  There is no evidence of significant soft tissue injury along the chest wall.  No acute osseous abnormalities are identified. Chronic healed left posterior rib fractures are seen.  CT ABDOMEN AND PELVIS FINDINGS  No free air or free fluid is seen within the abdomen or pelvis. There is no evidence of solid or hollow organ injury.  The liver and spleen are unremarkable in appearance. The gallbladder is within normal limits. The pancreas and adrenal glands are unremarkable.  The kidneys are unremarkable in appearance. There is no evidence of hydronephrosis. No renal or ureteral stones are seen. No perinephric stranding is appreciated.  No free fluid is identified. The small bowel is unremarkable in appearance. The stomach is within normal limits. No acute vascular abnormalities are seen.  The appendix is normal in caliber and contains air, without evidence for appendicitis. The colon is unremarkable in appearance.  The bladder is relatively decompressed. Apparent bladder wall thickening likely reflects relative decompression. The prostate remains normal in size. No inguinal lymphadenopathy is seen.  No acute osseous abnormalities are identified. A chronic defect is noted at the left iliac wing.  IMPRESSION: 1. No evidence of traumatic injury to the chest, abdomen or pelvis. 2. Minimal bibasilar atelectasis noted; lungs otherwise clear. Bleb at the left lung apex.   Electronically Signed   By: Garald Balding M.D.   On: 01/31/2014 02:21    Ct Cervical Spine Wo Contrast  01/31/2014   CLINICAL DATA:  Patient was found passed out in the middle of the road today. Patient is semi unresponsive. Multiple abrasions and bruising to the face. Altered mental status.  EXAM: CT HEAD WITHOUT CONTRAST  CT MAXILLOFACIAL WITHOUT CONTRAST  CT CERVICAL SPINE WITHOUT CONTRAST  TECHNIQUE: Multidetector CT  imaging of the head, cervical spine, and maxillofacial structures were performed using the standard protocol without intravenous contrast. Multiplanar CT image reconstructions of the cervical spine and maxillofacial structures were also generated.  COMPARISON:  09/14/2013 CT head face cervical.  FINDINGS: CT HEAD FINDINGS  Ventricles and sulci appear symmetrical. Subcutaneous hematoma at the soft tissues over the base of the skull posteriorly. No mass effect or midline shift. No abnormal extra-axial fluid collections. Gray-white matter junctions are distinct. Basal cisterns are not effaced. No evidence of acute intracranial hemorrhage. No depressed skull fractures. Opacification of mastoid air cells bilaterally, greater on the right.  CT MAXILLOFACIAL FINDINGS  The globes and extraocular muscles appear intact and symmetrical. Mucosal thickening in the right frontal sinus, bilateral ethmoid air cells, and sphenoid sinuses. No acute air-fluid levels in the paranasal sinuses. Old appearing nasal bone fractures. The orbital rims, facial bones, zygomatic arches, pterygoid plates, mandibles, and temporomandibular joints appear intact. No displaced fractures identified. Multiple prior tooth extractions.  CT CERVICAL SPINE FINDINGS  Normal alignment of the cervical spine and facet joints. Postoperative changes with anterior fusion of C4 through C6. Degenerative changes at C3-4 and C6-7 levels. Degenerative changes in the cervical facet joints. No vertebral compression deformities. No prevertebral soft tissue swelling. C1-2 articulation appears intact. No focal bone lesion or  bone destruction. Bone cortex and trabecular architecture appear intact. Soft tissue hematoma in the subcutaneous fat over the posterior neck.  IMPRESSION: No acute intracranial abnormalities.  Present inflammatory changes in the paranasal sinuses and mastoid air cells. No displaced orbital or facial fractures.  Postoperative changes of anterior fusion from C4 through C6. Degenerative changes in the cervical spine. No displaced fractures identified. Soft tissue hematoma over the posterior neck.   Electronically Signed   By: Lucienne Capers M.D.   On: 01/31/2014 02:06   Ct Pelvis W Contrast  02/18/2014   CLINICAL DATA:  Known anorectal abscess. Diaphoresis. Assess extent of abscess. Initial encounter.  EXAM: CT PELVIS WITH CONTRAST  TECHNIQUE: Multidetector CT imaging of the pelvis was performed using the standard protocol following the bolus administration of intravenous contrast.  CONTRAST:  148mL OMNIPAQUE IOHEXOL 300 MG/ML  SOLN  COMPARISON:  CT of the chest, abdomen and pelvis from 01/31/2014  FINDINGS: Note is made of a 4.4 x 2.9 x 1.7 cm abscess at the left side of the perineum, posterior to the anorectal canal. Mild associated soft tissue inflammation is noted extending about the gluteal cleft, more prominent on the left.  The visualized portions of the pelvis are otherwise grossly unremarkable. Visualized small and large bowel loops are within normal limits. The appendix is normal in caliber, without evidence for appendicitis. The bladder is mildly distended and grossly unremarkable. The prostate remains normal in size. No inguinal lymphadenopathy is seen. The visualized portions of the scrotum are within normal limits.  The musculature of the pelvis and proximal thighs is within normal limits. No acute osseous abnormalities are seen.  IMPRESSION: 4.4 x 2.9 x 1.7 cm abscess at the left side of the perineum, posterior to the anorectal canal. Mild associated soft tissue inflammation extends about the  gluteal cleft, more prominent on the left.   Electronically Signed   By: Garald Balding M.D.   On: 02/18/2014 22:06   Dg Shoulder Left  01/31/2014   CLINICAL DATA:  Found in middle of the road; left shoulder pain and lateral and posterior abrasions. Initial encounter.  EXAM: LEFT SHOULDER - 2+ VIEW  COMPARISON:  None.  FINDINGS: There is no evidence of fracture or dislocation. The left humeral head is seated within the glenoid fossa. The acromioclavicular joint is unremarkable in appearance. No significant soft tissue abnormalities are seen. The visualized portions of the left lung are clear. Apparent scattered debris is noted about the left shoulder.  IMPRESSION: 1. No evidence of fracture or dislocation. 2. Apparent scattered debris noted about the left shoulder.   Electronically Signed   By: Garald Balding M.D.   On: 01/31/2014 01:34   Dg Knee Complete 4 Views Left  01/31/2014   CLINICAL DATA:  Found in middle of road. Acute onset of left knee pain, with anterior abrasion above the patella. Initial encounter.  EXAM: LEFT KNEE - COMPLETE 4+ VIEW  COMPARISON:  None.  FINDINGS: There is no evidence of fracture or dislocation. The joint spaces are preserved. No significant degenerative change is seen; the patellofemoral joint is grossly unremarkable in appearance.  No significant joint effusion is seen. The visualized soft tissues are normal in appearance.  IMPRESSION: No evidence of fracture or dislocation.   Electronically Signed   By: Garald Balding M.D.   On: 01/31/2014 01:35   Ct Maxillofacial Wo Cm  01/31/2014   CLINICAL DATA:  Patient was found passed out in the middle of the road today. Patient is semi unresponsive. Multiple abrasions and bruising to the face. Altered mental status.  EXAM: CT HEAD WITHOUT CONTRAST  CT MAXILLOFACIAL WITHOUT CONTRAST  CT CERVICAL SPINE WITHOUT CONTRAST  TECHNIQUE: Multidetector CT imaging of the head, cervical spine, and maxillofacial structures were performed using the  standard protocol without intravenous contrast. Multiplanar CT image reconstructions of the cervical spine and maxillofacial structures were also generated.  COMPARISON:  09/14/2013 CT head face cervical.  FINDINGS: CT HEAD FINDINGS  Ventricles and sulci appear symmetrical. Subcutaneous hematoma at the soft tissues over the base of the skull posteriorly. No mass effect or midline shift. No abnormal extra-axial fluid collections. Gray-white matter junctions are distinct. Basal cisterns are not effaced. No evidence of acute intracranial hemorrhage. No depressed skull fractures. Opacification of mastoid air cells bilaterally, greater on the right.  CT MAXILLOFACIAL FINDINGS  The globes and extraocular muscles appear intact and symmetrical. Mucosal thickening in the right frontal sinus, bilateral ethmoid air cells, and sphenoid sinuses. No acute air-fluid levels in the paranasal sinuses. Old appearing nasal bone fractures. The orbital rims, facial bones, zygomatic arches, pterygoid plates, mandibles, and temporomandibular joints appear intact. No displaced fractures identified. Multiple prior tooth extractions.  CT CERVICAL SPINE FINDINGS  Normal alignment of the cervical spine and facet joints. Postoperative changes with anterior fusion of C4 through C6. Degenerative changes at C3-4 and C6-7 levels. Degenerative changes in the cervical facet joints. No vertebral compression deformities. No prevertebral soft tissue swelling. C1-2 articulation appears intact. No focal bone lesion or bone destruction. Bone cortex and trabecular architecture appear intact. Soft tissue hematoma in the subcutaneous fat over the posterior neck.  IMPRESSION: No acute intracranial abnormalities.  Present inflammatory changes in the paranasal sinuses and mastoid air cells. No displaced orbital or facial fractures.  Postoperative changes of anterior fusion from C4 through C6. Degenerative changes in the cervical spine. No displaced fractures  identified. Soft tissue hematoma over the posterior neck.   Electronically Signed   By: Lucienne Capers M.D.   On: 01/31/2014 02:06    INCISION AND DRAINAGE Performed by: Montine Circle Consent: Verbal consent obtained. Risks and benefits: risks, benefits and alternatives were discussed Type: abscess  Body  area: right buttock  Anesthesia: local infiltration  Incision was made with a scalpel.  Local anesthetic: lidocaine 2% with epinephrine  Anesthetic total: 5 ml  Complexity: complex Blunt dissection to break up loculations  Drainage: purulent  Drainage amount: copious  Packing material: 1/4 in iodoform gauze  Patient tolerance: Patient tolerated the procedure well with no immediate complications.    Imaging Review No results found.   EKG Interpretation None      MDM   Final diagnoses:  Rectal abscess  Abscess of buttock, right    Patient with what appears to be perirectal abscess. Will check CT pelvis to rule out any deep space infection. Patient mildly tachycardic, otherwise vital signs stable. Patient is well-appearing.   Patient seen by and discussed with Dr. Wilson Singer. Abscess assessed via rectal exam.  Abscess amenable to drainage in ED.  10:51 PM I and D successful with copious purulent discharge.  Patient has significant relief following procedure.  Will discharge with bactrim.  Recommend tylenol/ibuprofen for pain.   Montine Circle, PA-C 02/18/14 2258  Virgel Manifold, MD 02/19/14 [phone]

## 2014-02-18 NOTE — Plan of Care (Signed)
Problem: Consults Goal: Depression Patient Education See Patient Education Module for education specifics.  Outcome: Completed/Met Date Met:  02/18/14 Nurse discussed depression with patient.

## 2014-02-18 NOTE — Progress Notes (Signed)
Patient ID: Frank Moses, male   DOB: 04/06/1965, 49 y.o.   MRN: [id number] [facility] MD Progress Note  02/18/2014 3:08 PM Frank Moses  MRN:  [id number] Subjective:   Patient complains of pain around his rectum.  Patient denies suicidal/homicidal ideation, psychosis, and paranoia.  Patient angry stating that he wants something for pain.    Objective: Chart reviewed and discussed with nursing and psychiatrist.  Patient rectal area assessed.and harden area left unilateral tenderness to palpation without clearly defined margins likely cyst.  No redness noted.  Patient agitated related to the pain and requesting to go to the emergency room.  Patient instructed that not an emergency.  That he would get something for pain; and other comfort measures for pain but the emergency room was for emergency.     Principal Problem:  Depression and Alcohol Dependence  Diagnosis:   Patient Active Problem List   Diagnosis Date Noted  . Alcohol-induced mood disorder [F10.94] 02/15/2014  . Suicide attempt [T14.91] 02/15/2014  . Severe recurrent major depression without psychotic features [F33.2] 02/15/2014  . Alcohol abuse with alcohol-induced mood disorder [F10.14] 02/15/2014  . Hypokalemia [E87.6] 02/14/2014  . Suicidal ideation [R45.851] 02/14/2014  . Overdose [T50.901A] 02/13/2014  . Altered mental state [R41.82] 02/13/2014  . HTN (hypertension) [I10] 02/13/2014   Total Time spent with patient: 25 minutes   Past Medical History:  Past Medical History  Diagnosis Date  . Hypertension   . Arthritis   . Bipolar 1 disorder   . Schizophrenia   . Depression   . Stroke   . Asthma   . Peptic ulcer     Past Surgical History  Procedure Laterality Date  . Cervical fusion    . Bil foot surgery     Family History: History reviewed. No pertinent family history. Social History:  History  Alcohol Use  . Yes     History  Drug Use No    History   Social History  . Marital Status: Single    Spouse  Name: N/A    Number of Children: N/A  . Years of Education: N/A   Social History Main Topics  . Smoking status: Current Every Day Smoker -- 1.00 packs/day    Types: Cigarettes  . Smokeless tobacco: None  . Alcohol Use: Yes  . Drug Use: No  . Sexual Activity: Yes    Birth Control/ Protection: None   Other Topics Concern  . None   Social History Narrative   Additional History:    Sleep: Good  Appetite:  Good   Assessment:   Musculoskeletal: Strength & Muscle Tone: within normal limits- Gait & Station: normal Patient leans: N/A   Psychiatric Specialty Exam: Physical Exam  Constitutional: He is oriented to person, place, and time.  Neck: Normal range of motion.  Respiratory: Effort normal.  Musculoskeletal: Normal range of motion.  Neurological: He is alert and oriented to person, place, and time.  Skin:  Assessed rectum area on left inner buttocks with palpation a cyst was noted, no drainage. Tender to palpation.      Review of Systems  Gastrointestinal:       Patient complains of pain around rectum.  States that he has a history of cyst   Musculoskeletal:       History of gout   Psychiatric/Behavioral: Positive for substance abuse. Negative for memory loss. Depression: Denies. Suicidal ideas: Denies. Hallucinations: Denies. Nervous/anxious: Denies. Insomnia: Denies.   All other systems reviewed and are negative.  Blood pressure 118/77, pulse 88, temperature 99.5 F (37.5 C), temperature source Oral, resp. rate 18, height 6' 0.5" (1.842 m), weight 87.998 kg (194 lb).Body mass index is 25.94 kg/(m^2).  General Appearance: Casual and Fairly Groomed  Eye Contact::  Good  Speech:  Normal Rate  Volume:  Normal  Mood:  "Good"  Affect:  Appropriate  Thought Process:  Goal Directed and Linear  Orientation:  Full (Time, Place, and Person)  Thought Content:  Rumination  Suicidal Thoughts:  No Denies suicidal ideation at this time  Homicidal Thoughts:  No   Memory:   Recent and Remote grossly intact  Judgement:  Fair  Insight:  Present  Psychomotor Activity:  Normal  Concentration:  Good  Recall:  Good  Fund of Knowledge:Good  Language: Good  Akathisia:  Negative  Handed:  Right  AIMS (if indicated):     Assets:  Desire for Improvement Physical Health Resilience  ADL's:  Intact  Cognition: WNL  Sleep:  Number of Hours: 3.25    Current Medications: Current Facility-Administered Medications  Medication Dose Route Frequency Provider Last Rate Last Dose  . acamprosate (CAMPRAL) tablet 666 mg  666 mg Oral TID WC Jenne Campus, MD   666 mg at 02/18/14 1148  . acetaminophen (TYLENOL) tablet 650 mg  650 mg Oral Q6H PRN Laverle Hobby, PA-C   650 mg at 02/16/14 1458  . allopurinol (ZYLOPRIM) tablet 300 mg  300 mg Oral Daily Laverle Hobby, PA-C   300 mg at 02/18/14 0836  . alum & mag hydroxide-simeth (MAALOX/MYLANTA) 200-200-20 MG/5ML suspension 30 mL  30 mL Oral Q4H PRN Laverle Hobby, PA-C      . [START ON 02/19/2014] chlordiazePOXIDE (LIBRIUM) capsule 25 mg  25 mg Oral Daily Spencer E Simon, PA-C      . clindamycin (CLEOCIN) capsule 300 mg  300 mg Oral 4 times per day Lurena Nida, NP   300 mg at 02/18/14 1149  . colchicine tablet 0.6 mg  0.6 mg Oral BID Laverle Hobby, PA-C   0.6 mg at 02/18/14 0938  . diphenhydrAMINE (BENADRYL) capsule 50 mg  50 mg Oral Q6H PRN Laverle Hobby, PA-C   50 mg at 02/15/14 0110  . gabapentin (NEURONTIN) capsule 300 mg  300 mg Oral TID Laverle Hobby, PA-C   300 mg at 02/18/14 1152  . hydrochlorothiazide (HYDRODIURIL) tablet 25 mg  25 mg Oral Daily Laverle Hobby, PA-C   25 mg at 02/18/14 1829  . hydrocortisone cream 1 %   Topical BID Laverle Hobby, PA-C      . magnesium hydroxide (MILK OF MAGNESIA) suspension 30 mL  30 mL Oral Daily PRN Laverle Hobby, PA-C      . multivitamin with minerals tablet 1 tablet  1 tablet Oral Daily Laverle Hobby, PA-C   1 tablet at 02/18/14 9371  . naproxen (NAPROSYN) tablet  500 mg  500 mg Oral BID WC Laverle Hobby, PA-C   500 mg at 02/18/14 6967  . pantoprazole (PROTONIX) EC tablet 40 mg  40 mg Oral Daily Laverle Hobby, PA-C   40 mg at 02/18/14 8938  . thiamine (B-1) injection 100 mg  100 mg Intramuscular Once Laverle Hobby, PA-C   100 mg at 02/15/14 0045  . thiamine (VITAMIN B-1) tablet 100 mg  100 mg Oral Daily Laverle Hobby, PA-C   100 mg at 02/18/14 0840  . traMADol (ULTRAM) tablet 50 mg  50  mg Oral TID PC Shuvon Rankin, NP      . traZODone (DESYREL) tablet 100 mg  100 mg Oral QHS Laverle Hobby, PA-C   100 mg at 02/17/14 2257  . venlafaxine XR (EFFEXOR-XR) 24 hr capsule 150 mg  150 mg Oral Daily Laverle Hobby, PA-C   150 mg at 02/18/14 0840    Lab Results:  No results found for this or any previous visit (from the past 48 hour(s)).  Physical Findings: AIMS: Facial and Oral Movements Muscles of Facial Expression: None, normal Lips and Perioral Area: None, normal Jaw: None, normal Tongue: None, normal,Extremity Movements Upper (arms, wrists, hands, fingers): None, normal Lower (legs, knees, ankles, toes): None, normal, Trunk Movements Neck, shoulders, hips: None, normal, Overall Severity Severity of abnormal movements (highest score from questions above): None, normal Incapacitation due to abnormal movements: None, normal Patient's awareness of abnormal movements (rate only patient's report): No Awareness, Dental Status Current problems with teeth and/or dentures?: Yes Does patient usually wear dentures?: No  CIWA:  CIWA-Ar Total: 1 COWS:  COWS Total Score: 2   Assessment- At this time patient is improved, he is less depressed, presenting with a fuller range of affect, and less ruminative about recent psychosocial stressors, denies SI or HI. No severe withdrawals at this time.   Treatment Plan Summary: Daily contact with patient to assess and evaluate symptoms and progress in treatment, Medication management, Plan continue inpatient  treatment and continue medications as below Effexor XR 150 mgrs Q DAY  Neurontin 300 mgrs TID Start Campral 666 mgrs TID Continue Librium  Detox protocol  Today new orders for  Complaint of pain buttocks area.  Patient was also started on Clindamycin 300 mg Q 6 hr.    Ultram 50 mg Tid prn pain  Ring cushion (to sit on)  [employer]  Will continue with current treatment  Medical Decision Making:  New problem, with additional work up planned, Review or order clinical lab tests (1), Review of Last Therapy Session (1), Review of Medication Regimen & Side Effects (2) and Review of New Medication or Change in Dosage (2) Problem Points:  Established problem, stable/improving (1), Review of last therapy session (1) and Review of psycho-social stressors (1) Data Points:  Review of medication regiment & side effects (2) Review of new medications or change in dosage (2)  Rankin, Shuvon, FNP-BC 02/18/2014, 3:08 PM   I agreed with the findings, treatment and disposition plan of this patient. Berniece Andreas, MD

## 2014-02-18 NOTE — Progress Notes (Addendum)
Patient given ultram for boil/buttock pain #10.  Patient stated ultram is no better than flexeril that he has at the house.  That's a muscle relaxer.  "It ain't doing nothing."   Patient talked to NP's, AC, about boil and pain.

## 2014-02-18 NOTE — Progress Notes (Addendum)
Patient went to ER last night.  Nurse and NP discussed patient and his problem boil which was examined last night.  Patient has been started on antibiotics and had one dose of percocet 5/325 mg in ER.  Patient was told that this medication was only a one time dose.  Patient asks for stronger paid medication.  Patient takes his scheduled medication and then returns to his bed to sleep.  Emotional support and encouragement given patient.  Safety maintained with 15 minute checks.

## 2014-02-18 NOTE — Progress Notes (Signed)
D:  Patient's self inventory sheet, patient sleep good, sleep medication is helpful.  Fair appetite, low energy level, good concentratin.  Denied depression, hopeless and anxiety.  Denied withdrawals.  Denied SI.  Physical problems of pain, worst pain #10, "around my ____ shooter".  Pain medication helps somewhat.  Last night-no.  Goal is to figure out how to get family back without getting upset and doing something foolish, if its meant to be then it will happen if not then move on.  Goal is to think positive and pray.  Does have discharge plan.  No problems after discharge. A:  Medications administered per MD orders.  Emotional support and encouragement given patient. R:  Denied SI and HI, contracts for safety.  Denied A/V hallucinations.  Safety maintained with 15 minute checks.

## 2014-02-18 NOTE — ED Notes (Signed)
Patient c/o abscess noted around anus around Tuesday this week. Denies drainage from abscess. Denies fever/chills/nausea/vomiting. Has had "night sweats" and says this is unusual for him. No other complaints/concerns.

## 2014-02-18 NOTE — Progress Notes (Signed)
Spoke to Dr. Olevia Bowens, the hospitialist, who wanted the patient to go to the ER to be evaluated and to have a CAT SCAN of his pelvis. Spoke with [facility], Tina, who arranged for transportation to the [facility]. Pt was made aware. Precious Bard, charge nurse of the ER was phoned and given report.Pt states he has been having rectal pain a 10/10 . w/o any relief. The pain has been progressively getting worse since Wed. Per the pt. Pt was evaluated by NP and a consult was placed to the hospitalist/Pt refused to eat dinner and presently is lying on his left side in bed. Pt was made aware that he would be taken to the ER for evaluation.

## 2014-02-18 NOTE — Discharge Instructions (Signed)
Abscess °An abscess is an infected area that contains a collection of pus and debris. It can occur in almost any part of the body. An abscess is also known as a furuncle or boil. °CAUSES  °An abscess occurs when tissue gets infected. This can occur from blockage of oil or sweat glands, infection of hair follicles, or a minor injury to the skin. As the body tries to fight the infection, pus collects in the area and creates pressure under the skin. This pressure causes pain. People with weakened immune systems have difficulty fighting infections and get certain abscesses more often.  °SYMPTOMS °Usually an abscess develops on the skin and becomes a painful mass that is red, warm, and tender. If the abscess forms under the skin, you may feel a moveable soft area under the skin. Some abscesses break open (rupture) on their own, but most will continue to get worse without care. The infection can spread deeper into the body and eventually into the bloodstream, causing you to feel ill.  °DIAGNOSIS  °Your caregiver will take your medical history and perform a physical exam. A sample of fluid may also be taken from the abscess to determine what is causing your infection. °TREATMENT  °Your caregiver may prescribe antibiotic medicines to fight the infection. However, taking antibiotics alone usually does not cure an abscess. Your caregiver may need to make a small cut (incision) in the abscess to drain the pus. In some cases, gauze is packed into the abscess to reduce pain and to continue draining the area. °HOME CARE INSTRUCTIONS  °· Only take over-the-counter or prescription medicines for pain, discomfort, or fever as directed by your caregiver. °· If you were prescribed antibiotics, take them as directed. Finish them even if you start to feel better. °· If gauze is used, follow your caregiver's directions for changing the gauze. °· To avoid spreading the infection: °· Keep your draining abscess covered with a  bandage. °· Wash your hands well. °· Do not share personal care items, towels, or whirlpools with others. °· Avoid skin contact with others. °· Keep your skin and clothes clean around the abscess. °· Keep all follow-up appointments as directed by your caregiver. °SEEK MEDICAL CARE IF:  °· You have increased pain, swelling, redness, fluid drainage, or bleeding. °· You have muscle aches, chills, or a general ill feeling. °· You have a fever. °MAKE SURE YOU:  °· Understand these instructions. °· Will watch your condition. °· Will get help right away if you are not doing well or get worse. °Document Released: 10/23/2004 Document Revised: 07/15/2011 Document Reviewed: 03/28/2011 °ExitCare® Patient Information ©2015 [employer]. This information is not intended to replace advice given to you by your health care provider. Make sure you discuss any questions you have with your health care provider. ° °Abscess °Care After °An abscess (also called a boil or furuncle) is an infected area that contains a collection of pus. Signs and symptoms of an abscess include pain, tenderness, redness, or hardness, or you may feel a moveable soft area under your skin. An abscess can occur anywhere in the body. The infection may spread to surrounding tissues causing cellulitis. A cut (incision) by the surgeon was made over your abscess and the pus was drained out. Gauze may have been packed into the space to provide a drain that will allow the cavity to heal from the inside outwards. The boil may be painful for 5 to 7 days. Most people with a boil do not have   high fevers. Your abscess, if seen early, may not have localized, and may not have been lanced. If not, another appointment may be required for this if it does not get better on its own or with medications. °HOME CARE INSTRUCTIONS  °· Only take over-the-counter or prescription medicines for pain, discomfort, or fever as directed by your caregiver. °· When you bathe, soak and then  remove gauze or iodoform packs at least daily or as directed by your caregiver. You may then wash the wound gently with mild soapy water. Repack with gauze or do as your caregiver directs. °SEEK IMMEDIATE MEDICAL CARE IF:  °· You develop increased pain, swelling, redness, drainage, or bleeding in the wound site. °· You develop signs of generalized infection including muscle aches, chills, fever, or a general ill feeling. °· An oral temperature above 102° F (38.9° C) develops, not controlled by medication. °See your caregiver for a recheck if you develop any of the symptoms described above. If medications (antibiotics) were prescribed, take them as directed. °Document Released: 08/01/2004 Document Revised: 04/07/2011 Document Reviewed: 03/29/2007 °ExitCare® Patient Information ©2015 [employer]. This information is not intended to replace advice given to you by your health care provider. Make sure you discuss any questions you have with your health care provider. ° °

## 2014-02-18 NOTE — ED Provider Notes (Signed)
Medical screening examination/treatment/procedure(s) were conducted as a shared visit with non-physician practitioner(s) and myself.  I personally evaluated the patient during the encounter.   EKG Interpretation None     48yM with perirectal abscess. Amenable to bedside I&D. Rectal exam with significant tenderness/ fluctuance. Small nonbleeding/nonthrombosed external hemorrhoid. I&D'd by Montine Circle. Continued wound care. outpt FU as needed.   Virgel Manifold, MD 02/18/14 2325

## 2014-02-18 NOTE — [facility] Group Notes (Signed)
[facility] LCSW Group Therapy Note  02/18/2014 / 11:15 AM  Type of Therapy and Topic:  Group Therapy: Avoiding Self-Sabotaging and Enabling Behaviors  Participation Level:  Active  Therapeutic Goals: 1. Patient will identify one obstacle that relates to self-sabotage and enabling behaviors 2. Patient will identify one personal self-sabotaging or enabling behavior they did prior to admission 3. Patient able to establish a plan to change the above identified behavior they did prior to admission:  4. Patient will demonstrate ability to communicate their needs through discussion and/or role plays.   Summary of Patient Progress: The main focus of today's process group was to discuss what "self-sabotage" means and use Motivational Interviewing to discuss what benefits, negative or positive, were involved in a self-identified self-sabotaging behavior. We then talked about reasons the patient may want to change the behavior and current desire to change. The patient identified with self sabotaging in the form of substance use ("use, not abuse"). Patient processed that his use of alcohol has alienated most of his family. He was engaged in group process for a portion of the group as evidenced by his comments to others. He did appear to be physically uncomfortable and left group early.    Therapeutic Modalities:   Cognitive Behavioral Therapy Person-Centered Therapy Motivational Interviewing   Sheilah Pigeon, LCSW

## 2014-02-19 DIAGNOSIS — L0231 Cutaneous abscess of buttock: Secondary | ICD-10-CM | POA: Insufficient documentation

## 2014-02-19 NOTE — [facility] Group Notes (Signed)
[facility] LCSW Group Therapy  02/19/2014 1:15 PM   Type of Therapy:  Group Therapy  Participation Level:  Did Not Attend  Regan Lemming, LCSW 02/19/2014 3:12 PM

## 2014-02-19 NOTE — Progress Notes (Signed)
Patient ID: Frank Moses, male   DOB: 1965-11-17, 49 y.o.   MRN: [id number]  Update: Pt presented with a paper prescription for Bactrim per ED providers orders last night. Writer verified current medication therapy with [facility] ED physician, Dr. Reyne Dumas. Pt will continue with Cleocin administration as scheduled. Bactrim to be used at home, post discharge PRN, with subsequent discomfort/drainage.

## 2014-02-19 NOTE — Progress Notes (Signed)
Patient ID: MARIANA GOYTIA, male   DOB: 12-27-1965, 49 y.o.   MRN: [id number]  Pt removed perirectal dressing then notified writer. Wound assessed, serosanguinous drainage noted. Dr. Reyne Dumas at [facility] ED consulted about pts wound. Per MD orders: apply warm compress to area 2-3 times a day and clean wound with water and soap at least two times a day. [place] providers notified and Sitz bath ordered while at [facility]. Pt tolerated Sitz bath well. Pt given a donut to use throughout the day.Will continue to monitor.   Infection Prevention notified of pt situation. Per Cherrie, educate patient on washing hands before entering common areas and ensure that no drainage is noted in the common areas. Pt educated and able to verbalize understanding. Wound culture to be ordered in order to rule out MRSA as pt has a history of MRSA infections.

## 2014-02-19 NOTE — Progress Notes (Signed)
Patient ID: Frank Moses, male   DOB: [birth year], 49 y.o.   MRN: [id number]  Pt currently presents with a flat affect and depressed behavior. Per self inventory, pt rates depression at a 0, hopelessness 0 and anxiety 0. Pt's daily goal is to "getting out of here and getting my life back together " and they intend to do so by "think positive." Pt reports good sleep, poor concentration and a fair appetite.  Pt provided with medications per providers orders. Pt's labs and vitals were monitored throughout the day. Pt supported emotionally and encouraged to express concerns and questions. Pt educated on medications and healthy coping skills. Pt's safety ensured with 15 minute and environmental checks. Pt currently denies SI/HI and A/V hallucinations. Pt verbally agrees to seek staff if SI/HI or A/VH occurs and to consult with staff before acting on these thoughts. Pt reports grandchildren are a motivating factor in his recovery.

## 2014-02-19 NOTE — Progress Notes (Signed)
Patient readmitted to the unit from [facility]. Appear tired but stated "I am happy that I got it done; I am relieved". Received his due medication. Patient came in with a prescription on Bactrum while he was on Clindamycin. Theodoro Clock NP, notified who promised to verify the order. Food and drinks offered. No distress noted. Will continue to monitor patient.

## 2014-02-19 NOTE — Progress Notes (Signed)
Patient ID: Frank Moses, male   DOB: 1965-04-21, 49 y.o.   MRN: [id number]  Pt currently presents with a blunted affect and pleasant mood. Pet reports anxiety about the care of his elderly parents at home. Pt states that he "feels a lot better" since his return from the [facility] ED last night.  Pt provided with medications per providers orders. Pt's labs and vitals were monitored throughout the day. Pt supported emotionally and encouraged to express concerns and questions. Pt educated on medications, wound care, diet/nutrition and hand hygiene. Writer communicated with ED MD and [facility] about pt wound.  Pt's safety ensured with 15 minute and environmental checks. Pt currently denies SI/HI and A/V hallucinations. Pt verbally agrees to seek staff if SI/HI or A/VH occurs and to consult with staff before acting on these thoughts. Serosanguinous drainage noted from pts perirectal wound. Pt in no current distress. Will continue to monitor.

## 2014-02-19 NOTE — Progress Notes (Signed)
Patient ID: Frank Moses, male   DOB: October 05, 1965, 49 y.o.   MRN: [id number] [facility] MD Progress Note  02/19/2014 11:44 AM [name]  MRN:  [id number] Subjective:  Patient states that he is feeling "all right" today.  States that his "butt is feeling better; I went to the hospital yesterday and they fixed it." Patient tearful "My niece called me and she crying said she was upset about me being out here; I told her everything gone be all right.    Objective: Chart reviewed and discussed with nursing and psychiatrist.  Patient rectal area assessed.and harden area left unilateral tenderness to palpation without clearly defined margins likely cyst.  No redness noted.  Patient agitated related to the pain and requesting to go to the emergency room.  Patient instructed that not an emergency.  That he would get something for pain; and other comfort measures for pain but the emergency room was for emergency.     Principal Problem:  Depression and Alcohol Dependence  Diagnosis:   Patient Active Problem List   Diagnosis Date Noted  . Rectal abscess [K61.1] 02/18/2014  . Cyst near coccyx [L05.91]   . Alcohol-induced mood disorder [F10.94] 02/15/2014  . Suicide attempt [T14.91] 02/15/2014  . Severe recurrent major depression without psychotic features [F33.2] 02/15/2014  . Alcohol abuse with alcohol-induced mood disorder [F10.14] 02/15/2014  . Hypokalemia [E87.6] 02/14/2014  . Suicidal ideation [R45.851] 02/14/2014  . Overdose [T50.901A] 02/13/2014  . Altered mental state [R41.82] 02/13/2014  . HTN (hypertension) [I10] 02/13/2014   Total Time spent with patient: 25 minutes   Past Medical History:  Past Medical History  Diagnosis Date  . Hypertension   . Arthritis   . Bipolar 1 disorder   . Schizophrenia   . Depression   . Stroke   . Asthma   . Peptic ulcer     Past Surgical History  Procedure Laterality Date  . Cervical fusion    . Bil foot surgery     Family History: History  reviewed. No pertinent family history. Social History:  History  Alcohol Use  . Yes     History  Drug Use No    History   Social History  . Marital Status: Single    Spouse Name: N/A    Number of Children: N/A  . Years of Education: N/A   Social History Main Topics  . Smoking status: Current Every Day Smoker -- 1.00 packs/day    Types: Cigarettes  . Smokeless tobacco: None  . Alcohol Use: Yes  . Drug Use: No  . Sexual Activity: Yes    Birth Control/ Protection: None   Other Topics Concern  . None   Social History Narrative   Additional History:    Sleep: Good  Appetite:  Good   Assessment:   Musculoskeletal: Strength & Muscle Tone: within normal limits- Gait & Station: normal Patient leans: N/A   Psychiatric Specialty Exam: Physical Exam  Constitutional: He is oriented to person, place, and time.  Neck: Normal range of motion.  Respiratory: Effort normal.  Musculoskeletal: Normal range of motion.  Neurological: He is alert and oriented to person, place, and time.  Skin:  Assessed rectum area on left inner buttocks with palpation a cyst was noted, no drainage. Tender to palpation.      Review of Systems  Gastrointestinal: Negative for heartburn, nausea, vomiting, abdominal pain, diarrhea and constipation.       Patient states that he is feeling better (buttock) near  rectum.  Psychiatric/Behavioral: Positive for depression and substance abuse. Negative for suicidal ideas and hallucinations. The patient has insomnia. The patient is not nervous/anxious.     Blood pressure 106/69, pulse 102, temperature 97.7 F (36.5 C), temperature source Oral, resp. rate 18, height 6\' 2"  (1.88 m), weight 95.255 kg (210 lb), SpO2 96 %.Body mass index is 26.95 kg/(m^2).  General Appearance: Casual and Fairly Groomed  Eye Contact::  Good  Speech:  Normal Rate  Volume:  Normal  Mood:  "Good"  Affect:  Appropriate  Thought Process:  Goal Directed and Linear  Orientation:   Full (Time, Place, and Person)  Thought Content:  Rumination  Suicidal Thoughts:  No   Homicidal Thoughts:  No   Memory:  Recent and Remote grossly intact  Judgement:  Fair  Insight:  Present  Psychomotor Activity:  Normal  Concentration:  Good  Recall:  Good  Fund of Knowledge:Good  Language: Good  Akathisia:  Negative  Handed:  Right  AIMS (if indicated):     Assets:  Desire for Improvement Physical Health Resilience  ADL's:  Intact  Cognition: WNL  Sleep:  Number of Hours: 4.75    Current Medications: Current Facility-Administered Medications  Medication Dose Route Frequency Provider Last Rate Last Dose  . acamprosate (CAMPRAL) tablet 666 mg  666 mg Oral TID WC Jenne Campus, MD   666 mg at 02/19/14 0615  . acetaminophen (TYLENOL) tablet 650 mg  650 mg Oral Q6H PRN Laverle Hobby, PA-C   650 mg at 02/16/14 1458  . allopurinol (ZYLOPRIM) tablet 300 mg  300 mg Oral Daily Laverle Hobby, PA-C   300 mg at 02/19/14 5852  . alum & mag hydroxide-simeth (MAALOX/MYLANTA) 200-200-20 MG/5ML suspension 30 mL  30 mL Oral Q4H PRN Laverle Hobby, PA-C      . clindamycin (CLEOCIN) capsule 300 mg  300 mg Oral 4 times per day Lurena Nida, NP   300 mg at 02/19/14 0602  . colchicine tablet 0.6 mg  0.6 mg Oral BID Laverle Hobby, PA-C   0.6 mg at 02/19/14 0844  . diphenhydrAMINE (BENADRYL) capsule 50 mg  50 mg Oral Q6H PRN Laverle Hobby, PA-C   50 mg at 02/15/14 0110  . gabapentin (NEURONTIN) capsule 300 mg  300 mg Oral TID Laverle Hobby, PA-C   300 mg at 02/19/14 7782  . hydrochlorothiazide (HYDRODIURIL) tablet 25 mg  25 mg Oral Daily Laverle Hobby, PA-C   25 mg at 02/19/14 4235  . hydrocortisone cream 1 %   Topical BID Laverle Hobby, PA-C      . magnesium hydroxide (MILK OF MAGNESIA) suspension 30 mL  30 mL Oral Daily PRN Laverle Hobby, PA-C      . multivitamin with minerals tablet 1 tablet  1 tablet Oral Daily Laverle Hobby, PA-C   1 tablet at 02/19/14 3614  . naproxen  (NAPROSYN) tablet 500 mg  500 mg Oral BID WC Laverle Hobby, PA-C   500 mg at 02/19/14 4315  . pantoprazole (PROTONIX) EC tablet 40 mg  40 mg Oral Daily Laverle Hobby, PA-C   40 mg at 02/19/14 0843  . thiamine (B-1) injection 100 mg  100 mg Intramuscular Once Laverle Hobby, PA-C   100 mg at 02/15/14 0045  . thiamine (VITAMIN B-1) tablet 100 mg  100 mg Oral Daily Laverle Hobby, PA-C   100 mg at 02/19/14 0844  . traMADol (ULTRAM) tablet 50  mg  50 mg Oral TID PRN Shuvon Rankin, NP   50 mg at 02/18/14 1555  . traZODone (DESYREL) tablet 100 mg  100 mg Oral QHS Laverle Hobby, PA-C   100 mg at 02/19/14 0035  . venlafaxine XR (EFFEXOR-XR) 24 hr capsule 150 mg  150 mg Oral Daily Laverle Hobby, PA-C   150 mg at 02/19/14 7564    Lab Results:  Results for orders placed or performed during the hospital encounter of 02/14/14 (from the past 48 hour(s))  CBC with Differential/Platelet     Status: Abnormal   Collection Time: 02/18/14  8:39 PM  Result Value Ref Range   WBC 9.7 4.0 - 10.5 K/uL   RBC 4.13 (L) 4.22 - 5.81 MIL/uL   Hemoglobin 13.7 13.0 - 17.0 g/dL   HCT 38.3 (L) 39.0 - 52.0 %   MCV 92.7 78.0 - 100.0 fL   MCH 33.2 26.0 - 34.0 pg   MCHC 35.8 30.0 - 36.0 g/dL   RDW 12.4 11.5 - 15.5 %   Platelets 170 150 - 400 K/uL   Neutrophils Relative % 70 43 - 77 %   Neutro Abs 6.7 1.7 - 7.7 K/uL   Lymphocytes Relative 17 12 - 46 %   Lymphs Abs 1.6 0.7 - 4.0 K/uL   Monocytes Relative 11 3 - 12 %   Monocytes Absolute 1.1 (H) 0.1 - 1.0 K/uL   Eosinophils Relative 2 0 - 5 %   Eosinophils Absolute 0.2 0.0 - 0.7 K/uL   Basophils Relative 0 0 - 1 %   Basophils Absolute 0.0 0.0 - 0.1 K/uL  I-stat chem 8, ed     Status: Abnormal   Collection Time: 02/18/14  8:49 PM  Result Value Ref Range   Sodium 138 135 - 145 mmol/L   Potassium 3.6 3.5 - 5.1 mmol/L   Chloride 101 96 - 112 mmol/L   BUN 7 6 - 23 mg/dL   Creatinine, Ser 0.90 0.50 - 1.35 mg/dL   Glucose, Bld 194 (H) 70 - 99 mg/dL   Calcium, Ion  1.12 1.12 - 1.23 mmol/L   TCO2 21 0 - 100 mmol/L   Hemoglobin 13.6 13.0 - 17.0 g/dL   HCT 40.0 39.0 - 52.0 %    Physical Findings: AIMS: Facial and Oral Movements Muscles of Facial Expression: None, normal Lips and Perioral Area: None, normal Jaw: None, normal Tongue: None, normal,Extremity Movements Upper (arms, wrists, hands, fingers): None, normal Lower (legs, knees, ankles, toes): None, normal, Trunk Movements Neck, shoulders, hips: None, normal, Overall Severity Severity of abnormal movements (highest score from questions above): None, normal Incapacitation due to abnormal movements: None, normal Patient's awareness of abnormal movements (rate only patient's report): No Awareness, Dental Status Current problems with teeth and/or dentures?: Yes Does patient usually wear dentures?: No  CIWA:  CIWA-Ar Total: 1 COWS:  COWS Total Score: 2   Assessment- At this time patient is improved, he is less depressed, presenting with a fuller range of affect, and less ruminative about recent psychosocial stressors, denies SI or HI. No severe withdrawals at this time.   Treatment Plan Summary: Daily contact with patient to assess and evaluate symptoms and progress in treatment, Medication management, Plan continue inpatient treatment and continue medications as below Effexor XR 150 mgrs Q DAY  Neurontin 300 mgrs TID Start Campral 666 mgrs TID Continue Librium  Detox protocol  Today new orders for  Complaint of pain buttocks area.  Patient was also started on Clindamycin  300 mg Q 6 hr.    Ultram 50 mg Tid prn pain  Ring cushion (to sit on)  [employer]  Will continue with current treatment plan with no changes at this time  Medical Decision Making:  New problem, with additional work up planned, Review or order clinical lab tests (1), Review of Last Therapy Session (1), Review of Medication Regimen & Side Effects (2) and Review of New Medication or Change in Dosage (2) Problem Points:   Established problem, stable/improving (1), Review of last therapy session (1) and Review of psycho-social stressors (1) Data Points:  Review of medication regiment & side effects (2) Review of new medications or change in dosage (2)  Rankin, Shuvon, FNP-BC 02/19/2014, 11:44 AM   I agreed with the findings, treatment and disposition plan of this patient. Berniece Andreas, MD

## 2014-02-19 NOTE — Plan of Care (Signed)
Problem: Ineffective individual coping Goal: STG: Patient will participate in after care plan Patient will attend attend groups and engage in discussion. Outpatient follow up appointment will be scheduled. Joette Catching, LCSW Clinical Social Worker [phone]  02/15/2014 11:23 AM  Patient is attending groups and engaging in discussion. Outpatient follow up appointment will be scheduled with [facility].  Burnis Medin Hodnett, LCSW 02/17/2014 12:23 PM    Outcome: Progressing Pt able to verbalize plan for discharge.

## 2014-02-20 MED ORDER — HYDROCHLOROTHIAZIDE 25 MG PO TABS: 25.0000 mg | ORAL_TABLET | Freq: Every day | ORAL | Status: DC

## 2014-02-20 MED ORDER — ACAMPROSATE CALCIUM 333 MG PO TBEC: 666.0000 mg | DELAYED_RELEASE_TABLET | Freq: Three times a day (TID) | ORAL | Status: DC

## 2014-02-20 MED ORDER — CLINDAMYCIN HCL 300 MG PO CAPS: 300.0000 mg | ORAL_CAPSULE | Freq: Four times a day (QID) | ORAL | Status: DC

## 2014-02-20 MED ORDER — GABAPENTIN 300 MG PO CAPS: 300.0000 mg | ORAL_CAPSULE | Freq: Three times a day (TID) | ORAL | Status: DC

## 2014-02-20 MED ORDER — PANTOPRAZOLE SODIUM 40 MG PO TBEC: 40.0000 mg | DELAYED_RELEASE_TABLET | Freq: Every day | ORAL | Status: DC

## 2014-02-20 MED ORDER — ALLOPURINOL 300 MG PO TABS: 300.0000 mg | ORAL_TABLET | Freq: Every day | ORAL | Status: DC

## 2014-02-20 MED ORDER — TRAZODONE HCL 100 MG PO TABS: 100.0000 mg | ORAL_TABLET | Freq: Every day | ORAL | Status: DC

## 2014-02-20 MED ORDER — DIPHENHYDRAMINE HCL 50 MG PO CAPS: 50.0000 mg | ORAL_CAPSULE | Freq: Four times a day (QID) | ORAL | Status: DC | PRN

## 2014-02-20 MED ORDER — COLCHICINE 0.6 MG PO TABS: 0.6000 mg | ORAL_TABLET | Freq: Two times a day (BID) | ORAL | Status: DC

## 2014-02-20 MED ORDER — HYDROCORTISONE 1 % EX CREA: TOPICAL_CREAM | Freq: Two times a day (BID) | CUTANEOUS | Status: DC

## 2014-02-20 MED ORDER — VENLAFAXINE HCL ER 150 MG PO CP24: 150.0000 mg | ORAL_CAPSULE | Freq: Every day | ORAL | Status: DC
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
## 2014-02-20 NOTE — Progress Notes (Signed)
  [facility] Adult Case Management Discharge Plan :  Will you be returning to the same living situation after discharge:  Yes,  Patient is returning to his home. At discharge, do you have transportation home?: Yes,  Patient to arrange transportation home. Do you have the ability to pay for your medications: Yes,  Patient has Medicare.  Release of information consent forms completed and in the chart;  Patient's signature needed at discharge.  Patient to Follow up at: Follow-up Information    Follow up with [facility] On 02/22/2014.   Why:  You are scheduled with [facility] on Wednesday, February 22, 2014 between 7:45 -10:30 AM   Contact information:   [address]  [place], [place]  [address]  [phone]      Follow up with Celedonio Savage, MD In 2 days.   Specialty:  Family Medicine   Why:  For wound re-check and packing removal   Contact information:   [address] [place] [address] [phone]       Patient denies SI/HI: Patient no longer endorsing SI/HI or other thoughts of self harm.     Safety Planning and Suicide Prevention discussed: .Reviewed with all patients during discharge planning group   Has patient been referred to the Quitline?:  Referral made to Quitline on 02/15/14. Concha Pyo 02/20/2014, 9:38 AM

## 2014-02-20 NOTE — Progress Notes (Signed)
Patient ID: Frank Moses, male   DOB: 1965/08/23, 49 y.o.   MRN: [id number]  D: Patient reports he feels ready for discharge. Denies any SI and no psychosis. Physician met with him and agreed to discharge. A: Obtained all belongings, sample medication, prescriptions, and follow-up appointment. R: Friend/family in lobby to transport home.

## 2014-02-20 NOTE — Consult Note (Signed)
WOC wound consult note Reason for Consult:Patient with perirectal abscess/cyst drained in ED last week Wound type:infectious Pressure Ulcer POA: No Measurement: 0.4cm round x 1.5cm depth Wound bed: from exterior, wound bed is red, moist Drainage (amount, consistency, odor) scant serosanguinous, no odor Periwound:intact, clear, no induration, no erythema or warmth Dressing procedure/placement/frequency: I will suggest that today's packing be the last required as now that infectious material has been drained and patient has been on antibiotic therapy for several days that problem is resolving and spontaneous healing will occur. I cleansed and redressed wound today and have provided orders that he may shower, but leave packing in until Wednesday (48 hours).  On Wednesday, patient may remove packing and cleanse area daily with antimicrobial soap and water in the shower, leaving dressings off.  He may use Futures trader (provided) as he desires for comfort and also "donut" shaped foam ring while sitting.  He is to complete the course of oral antibiotic therapy and follow up with his PCP as indicated. [facility] nursing team will not follow, but will remain available to this patient, the nursing and medical team.  Please re-consult if needed. Thanks, Maudie Flakes, MSN, RN, [place], [place], [place] [phone])

## 2014-02-20 NOTE — BHH Group Notes (Signed)
[facility] LCSW Aftercare Discharge Planning Group Note   02/20/2014 9:34 AM    Participation Quality:  Appropraite  Mood/Affect:  Appropriate  Depression Rating:  0  Anxiety Rating:  0  Thoughts of Suicide:  No  Will you contract for safety?   NA  Current AVH:  No  Plan for Discharge/Comments:  Patient attended discharge planning group and actively participated in group. He reports doing well and being ready to discharge home today.  He will follow up with [facility]. Suicide prevention education reviewed and SPE document provided.   Transportation Means: Patient has transportation.   Supports:  Patient has a support system.   Allisa Einspahr, Eulas Post

## 2014-02-20 NOTE — BHH Suicide Risk Assessment (Signed)
[facility] Discharge Suicide Risk Assessment   Demographic Factors:  49 year old single male, lives with parents, on disability   Total Time spent with patient: 30 minutes  Musculoskeletal: Strength & Muscle Tone: within normal limits Gait & Station: normal Patient leans: N/A  Psychiatric Specialty Exam: Physical Exam  ROS  Blood pressure 116/81, pulse 85, temperature 97.4 F (36.3 C), temperature source Oral, resp. rate 16, height 6\' 2"  (1.88 m), weight 210 lb (95.255 kg), SpO2 96 %.Body mass index is 26.95 kg/(m^2).  General Appearance: improved grooming  Eye Contact::  Good  Speech:  Normal Rate  Volume:  Normal  Mood:  Euthymic  Affect:  Appropriate and Full Range  Thought Process:  Goal Directed and Linear  Orientation:  Full (Time, Place, and Person)  Thought Content:  no hallucinations, no delusions  Suicidal Thoughts:  No  Homicidal Thoughts:  No  Memory:  recent and remote grossly intact   Judgement:  Other:  improved  Insight:  improved   Psychomotor Activity:  Normal  Concentration:  Good  Recall:  Good  Fund of Knowledge:Good  Language: Good  Akathisia:  Negative  Handed:  Right  AIMS (if indicated):     Assets:  Communication Skills Desire for Improvement Resilience  Sleep:  Number of Hours: 5.25  Cognition: WNL  ADL's:  Intact   Have you used any form of tobacco in the last 30 days? (Cigarettes, Smokeless Tobacco, Cigars, and/or Pipes): Yes  Has this patient used any form of tobacco in the last 30 days? (Cigarettes, Smokeless Tobacco, Cigars, and/or Pipes) patient encouraged to  Abstain from smoking and is interested in nicotine replacement therapy such as nicorette gum  Mental Status Per Nursing Assessment::   On Admission:     Current Mental Status by Physician: At this time patient is improved, with improved mood , improved range of affect, no longer feeling depressed, no SI or HI, no psychotic symptoms, future oriented. No withdrawal symptoms. Of  note, has a perirectal abscess/pilonidal cyst which was lanced and debrided in ED, at this time much improved, no pain, no fever, no chills.  Loss Factors: Disability, relationship conflicts   Historical Factors: History of Depression, History of Alcohol Dependence , no prior history of suicide attempts   Risk Reduction Factors:   Sense of responsibility to family, Living with another person, especially a relative, Positive social support and Positive coping skills or problem solving skills  Continued Clinical Symptoms:  As above, at this time mood and affect improved, mood currently euthymic, affect appropriate, no SI or HI, no psychotic symptoms, no withdrawal symptoms, and future oriented.   Cognitive Features That Contribute To Risk:  at this time denies any thoughts of hurting self and  contracts for safety on unit     Suicide Risk:  Mild:  Suicidal ideation of limited frequency, intensity, duration, and specificity.  There are no identifiable plans, no associated intent, mild dysphoria and related symptoms, good self-control (both objective and subjective assessment), few other risk factors, and identifiable protective factors, including available and accessible social support.  Principal Problem: Depression NOS , consider MDD versus Alcohol Induced Mood Disorder, Alcohol Dependence  Discharge Diagnoses:  Patient Active Problem List   Diagnosis Date Noted  . Abscess of buttock, right [L02.31]   . Rectal abscess [K61.1] 02/18/2014  . Cyst near coccyx [L05.91]   . Alcohol-induced mood disorder [F10.94] 02/15/2014  . Suicide attempt [T14.91] 02/15/2014  . Severe recurrent major depression without psychotic features [F33.2]  02/15/2014  . Alcohol abuse with alcohol-induced mood disorder [F10.14] 02/15/2014  . Hypokalemia [E87.6] 02/14/2014  . Suicidal ideation [R45.851] 02/14/2014  . Overdose [T50.901A] 02/13/2014  . Altered mental state [R41.82] 02/13/2014  . HTN (hypertension)  [I10] 02/13/2014    Follow-up Information    Follow up with [facility] On 02/22/2014.   Why:  You are scheduled with [facility] on Wednesday, February 22, 2014 between 7:45 -10:30 AM   Contact information:   [address]  [place], [place]  [address]  [phone]      Follow up with Celedonio Savage, MD In 2 days.   Specialty:  Family Medicine   Why:  For wound re-check and packing removal   Contact information:   [address] [place] [address] [phone]       Plan Of Care/Follow-up recommendations:  Activity:  As tolerated Diet:  Low sodium, heart healthy Tests:  NA Other:  see below  Is patient on multiple antipsychotic therapies at discharge:  No   Has Patient had three or more failed trials of antipsychotic monotherapy by history:  No  Recommended Plan for Multiple Antipsychotic Therapies: NA  Patient is leaving in good spirits. Plans to return home ( lives with parents) . Plans to see  PCP for medical management and monitoring as needed. Encouraged to go to [employer] regularly.  COBOS, FERNANDO 02/20/2014, 11:25 AM

## 2014-02-20 NOTE — Progress Notes (Signed)
Patient ID: Frank Moses, male   DOB: 11/15/1965, 49 y.o.   MRN: [id number] PER STATE REGULATIONS 482.30  THIS CHART WAS REVIEWED FOR MEDICAL NECESSITY WITH RESPECT TO THE PATIENT'S ADMISSION/ DURATION OF STAY.  NEXT REVIEW DATE: 02/22/2014  Chauncy Lean, RN, BSN CASE MANAGER

## 2014-02-20 NOTE — Plan of Care (Signed)
Problem: Alteration in mood & ability to function due to Goal: LTG-Pt reports reduction in suicidal thoughts (Patient reports reduction in suicidal thoughts and is able to verbalize a safety plan for whenever patient is feeling suicidal)  Outcome: Progressing Pt denied SI this shift.  He verbally contracted for safety.

## 2014-02-20 NOTE — Progress Notes (Signed)
D: Pt has appropriate affect and anxious mood.  Pt reports his day has been "up and down."  Pt reports his goal today was "to keep doing the right thing.  Get myself out of here and try to take care of my mom and dad."  Pt reports that he feels safe to discharge and that he may be discharge tomorrow or Tuesday.  Pt denies SI/HI, denies hallucinations.  Pt interacts appropriately with peers and staff. A:  Ways to prevent spread of infection reviewed with pt.  Pt verbalized understanding.  Handwashing encouraged.  PRN medication administered for headache, see flowsheet.  Safety maintained.  Supported and encouraged pt.   R: Pt is compliant with medications.  He verbally contracts for safety.  Will continue to monitor and assess for safety.

## 2014-02-23 NOTE — Progress Notes (Signed)
Patient Discharge Instructions:  After Visit Summary (AVS):   Faxed to:  02/23/14 Discharge Summary Note:   Faxed to:  02/23/14 Psychiatric Admission Assessment Note:   Faxed to:  02/23/14 Suicide Risk Assessment - Discharge Assessment:   Faxed to:  02/23/14 Faxed/Sent to the Next Level Care provider:  02/23/14  Faxed to [facility] @ [place], 02/23/2014, 2:29 PM

## 2015-01-05 ENCOUNTER — Emergency Department (HOSPITAL_COMMUNITY)
Admission: EM | Admit: 2015-01-05 | Discharge: 2015-01-06 | Disposition: A | Discharge status: Home / Self Care | Payer: Medicaid Other | Attending: Emergency Medicine | Admitting: Emergency Medicine

## 2015-01-05 ENCOUNTER — Encounter (HOSPITAL_COMMUNITY): Payer: Self-pay | Admitting: *Deleted

## 2015-01-05 ENCOUNTER — Ambulatory Visit (HOSPITAL_COMMUNITY)
Admission: RE | Admit: 2015-01-05 | Discharge: 2015-01-05 | Disposition: A | Discharge status: Home / Self Care | Payer: Medicare HMO | Attending: Psychiatry | Admitting: Psychiatry

## 2015-01-05 DIAGNOSIS — F39 Unspecified mood [affective] disorder: Secondary | ICD-10-CM | POA: Insufficient documentation

## 2015-01-05 DIAGNOSIS — Z79899 Other long term (current) drug therapy: Secondary | ICD-10-CM | POA: Insufficient documentation

## 2015-01-05 DIAGNOSIS — J45909 Unspecified asthma, uncomplicated: Secondary | ICD-10-CM | POA: Insufficient documentation

## 2015-01-05 DIAGNOSIS — F319 Bipolar disorder, unspecified: Secondary | ICD-10-CM | POA: Insufficient documentation

## 2015-01-05 DIAGNOSIS — F259 Schizoaffective disorder, unspecified: Secondary | ICD-10-CM | POA: Diagnosis not present

## 2015-01-05 DIAGNOSIS — R45850 Homicidal ideations: Secondary | ICD-10-CM | POA: Insufficient documentation

## 2015-01-05 DIAGNOSIS — Z981 Arthrodesis status: Secondary | ICD-10-CM | POA: Insufficient documentation

## 2015-01-05 DIAGNOSIS — Z7952 Long term (current) use of systemic steroids: Secondary | ICD-10-CM | POA: Diagnosis not present

## 2015-01-05 DIAGNOSIS — F17210 Nicotine dependence, cigarettes, uncomplicated: Secondary | ICD-10-CM | POA: Diagnosis not present

## 2015-01-05 DIAGNOSIS — I10 Essential (primary) hypertension: Secondary | ICD-10-CM | POA: Insufficient documentation

## 2015-01-05 DIAGNOSIS — Z8673 Personal history of transient ischemic attack (TIA), and cerebral infarction without residual deficits: Secondary | ICD-10-CM | POA: Insufficient documentation

## 2015-01-05 DIAGNOSIS — R45851 Suicidal ideations: Secondary | ICD-10-CM | POA: Diagnosis present

## 2015-01-05 DIAGNOSIS — F329 Major depressive disorder, single episode, unspecified: Secondary | ICD-10-CM | POA: Diagnosis not present

## 2015-01-05 DIAGNOSIS — K279 Peptic ulcer, site unspecified, unspecified as acute or chronic, without hemorrhage or perforation: Secondary | ICD-10-CM | POA: Insufficient documentation

## 2015-01-05 DIAGNOSIS — M1990 Unspecified osteoarthritis, unspecified site: Secondary | ICD-10-CM | POA: Diagnosis not present

## 2015-01-05 DIAGNOSIS — F209 Schizophrenia, unspecified: Secondary | ICD-10-CM | POA: Insufficient documentation

## 2015-01-05 DIAGNOSIS — F1020 Alcohol dependence, uncomplicated: Secondary | ICD-10-CM | POA: Diagnosis not present

## 2015-01-05 DIAGNOSIS — Z8711 Personal history of peptic ulcer disease: Secondary | ICD-10-CM | POA: Diagnosis not present

## 2015-01-05 LAB — COMPREHENSIVE METABOLIC PANEL
ALBUMIN: 4.1 g/dL (ref 3.5–5.0)
ALK PHOS: 86 U/L (ref 38–126)
ALT: 24 U/L (ref 17–63)
ANION GAP: 12 (ref 5–15)
AST: 25 U/L (ref 15–41)
BILIRUBIN TOTAL: 0.6 mg/dL (ref 0.3–1.2)
BUN: 9 mg/dL (ref 6–20)
CALCIUM: 9.5 mg/dL (ref 8.9–10.3)
CO2: 23 mmol/L (ref 22–32)
CREATININE: 0.7 mg/dL (ref 0.61–1.24)
Chloride: 105 mmol/L (ref 101–111)
GFR calc Af Amer: >60 mL/min (ref >60)
GFR calc non Af Amer: >60 mL/min (ref >60)
GLUCOSE: 139 mg/dL — AB (ref 65–99)
Potassium: 4.2 mmol/L (ref 3.5–5.1)
Sodium: 140 mmol/L (ref 135–145)
TOTAL PROTEIN: 7.8 g/dL (ref 6.5–8.1)

## 2015-01-05 LAB — RAPID URINE DRUG SCREEN, HOSP PERFORMED
Amphetamines: NOT DETECTED
BARBITURATES: NOT DETECTED
Benzodiazepines: NOT DETECTED
Cocaine: NOT DETECTED
Opiates: NOT DETECTED
TETRAHYDROCANNABINOL: NOT DETECTED

## 2015-01-05 LAB — CBC
HEMATOCRIT: 44.9 % (ref 39.0–52.0)
Hemoglobin: 16.1 g/dL (ref 13.0–17.0)
MCH: 32.1 pg (ref 26.0–34.0)
MCHC: 35.9 g/dL (ref 30.0–36.0)
MCV: 89.4 fL (ref 78.0–100.0)
Platelets: 188 10*3/uL (ref 150–400)
RBC: 5.02 MIL/uL (ref 4.22–5.81)
RDW: 13.7 % (ref 11.5–15.5)
WBC: 6.5 10*3/uL (ref 4.0–10.5)

## 2015-01-05 LAB — ETHANOL: Alcohol, Ethyl (B): 17 mg/dL — ABNORMAL HIGH (ref <5)

## 2015-01-05 LAB — ACETAMINOPHEN LEVEL

## 2015-01-05 LAB — SALICYLATE LEVEL: Salicylate Lvl: <4 mg/dL (ref 2.8–30.0)

## 2015-01-05 MED ORDER — ACETAMINOPHEN 325 MG PO TABS: 650.0000 mg | ORAL_TABLET | ORAL | Status: DC | PRN

## 2015-01-05 MED ORDER — IBUPROFEN 200 MG PO TABS: 600.0000 mg | ORAL_TABLET | Freq: Three times a day (TID) | ORAL | Status: DC | PRN

## 2015-01-05 MED ORDER — VENLAFAXINE HCL ER 150 MG PO CP24
150.0000 mg | ORAL_CAPSULE | Freq: Every day | ORAL | Status: DC
  Administered 2015-01-05 – 2015-01-06 (×2): 150 mg via ORAL
  Filled 2015-01-05 (×2): qty 1

## 2015-01-05 MED ORDER — PANTOPRAZOLE SODIUM 40 MG PO TBEC
40.0000 mg | DELAYED_RELEASE_TABLET | Freq: Every day | ORAL | Status: DC
  Administered 2015-01-05 – 2015-01-06 (×2): 40 mg via ORAL
  Filled 2015-01-05 (×2): qty 1

## 2015-01-05 MED ORDER — LORAZEPAM 1 MG PO TABS: 1.0000 mg | ORAL_TABLET | Freq: Three times a day (TID) | ORAL | Status: DC | PRN

## 2015-01-05 MED ORDER — ONDANSETRON HCL 4 MG PO TABS: 4.0000 mg | ORAL_TABLET | Freq: Three times a day (TID) | ORAL | Status: DC | PRN

## 2015-01-05 MED ORDER — TRAZODONE HCL 100 MG PO TABS
100.0000 mg | ORAL_TABLET | Freq: Every day | ORAL | Status: DC
  Administered 2015-01-05: 100 mg via ORAL
  Filled 2015-01-05: qty 1

## 2015-01-05 MED ORDER — HYDROCHLOROTHIAZIDE 25 MG PO TABS
25.0000 mg | ORAL_TABLET | Freq: Every day | ORAL | Status: DC
  Administered 2015-01-05 – 2015-01-06 (×2): 25 mg via ORAL
  Filled 2015-01-05 (×2): qty 1

## 2015-01-05 NOTE — ED Notes (Addendum)
Pt at this time is voluntary and will NOT be IVC at this time. If pt tries to leave he will need to be IVC.
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
## 2015-01-05 NOTE — ED Provider Notes (Signed)
CSN: [id number]     Arrival date & time 01/05/15  1541 History   First MD Initiated Contact with Patient 01/05/15 1742     Chief Complaint  Patient presents with  . Suicidal  . Hallucinations     (Consider location/radiation/quality/duration/timing/severity/associated sxs/prior Treatment) The history is provided by the patient.   patient has a history of bipolar disorder and schizophrenia. He presents with suicidal and homicidal thoughts. He's had a history of same. Spelled off his medications for a while since he got out of jail. States that he has not been taking his other medications either. No chest pain or trouble breathing. He denies substance abuse. He does smoke cigarettes. States he would possibly like his ex's house on fire and burned them both down.  Past Medical History  Diagnosis Date  . Hypertension   . Arthritis   . Bipolar 1 disorder ([place])   . Schizophrenia ([place])   . Depression   . Stroke ([place])   . Asthma   . Peptic ulcer    Past Surgical History  Procedure Laterality Date  . Cervical fusion    . Bil foot surgery     History reviewed. No pertinent family history. Social History  Substance Use Topics  . Smoking status: Current Every Day Smoker -- 1.00 packs/day    Types: Cigarettes  . Smokeless tobacco: None  . Alcohol Use: Yes    Review of Systems  Constitutional: Negative for activity change and appetite change.  Eyes: Negative for pain.  Respiratory: Negative for chest tightness and shortness of breath.   Cardiovascular: Negative for chest pain and leg swelling.  Gastrointestinal: Negative for nausea, vomiting, abdominal pain and diarrhea.  Genitourinary: Negative for flank pain.  Musculoskeletal: Negative for back pain and neck stiffness.  Skin: Negative for rash.  Neurological: Negative for weakness, numbness and headaches.  Psychiatric/Behavioral: Positive for suicidal ideas. Negative for hallucinations and behavioral problems.      Allergies   Shrimp; Orange fruit; and Tomato  Home Medications   Prior to Admission medications   Medication Sig Start Date End Date Taking? Authorizing Provider  acamprosate (CAMPRAL) 333 MG tablet Take 2 tablets (666 mg total) by mouth 3 (three) times daily with meals. For alcohol addiction 02/20/14  Yes Encarnacion Slates, NP  allopurinol (ZYLOPRIM) 300 MG tablet Take 1 tablet (300 mg total) by mouth daily. For gout arthritis 02/20/14  Yes Encarnacion Slates, NP  clindamycin (CLEOCIN) 300 MG capsule Take 1 capsule (300 mg total) by mouth every 6 (six) hours. For infection 02/20/14  Yes Encarnacion Slates, NP  colchicine 0.6 MG tablet Take 1 tablet (0.6 mg total) by mouth 2 (two) times daily. For gouty arthritis 02/20/14  Yes Encarnacion Slates, NP  diphenhydrAMINE (BENADRYL) 50 MG capsule Take 1 capsule (50 mg total) by mouth every 6 (six) hours as needed for itching or allergies. 02/20/14  Yes Encarnacion Slates, NP  hydrochlorothiazide (HYDRODIURIL) 25 MG tablet Take 1 tablet (25 mg total) by mouth daily. For high blood pressure 02/20/14  Yes Encarnacion Slates, NP  hydrocortisone cream 1 % Apply topically 2 (two) times daily. For itching 02/20/14  Yes Encarnacion Slates, NP  pantoprazole (PROTONIX) 40 MG tablet Take 1 tablet (40 mg total) by mouth daily. For acid reflux 02/20/14  Yes Encarnacion Slates, NP  traZODone (DESYREL) 100 MG tablet Take 1 tablet (100 mg total) by mouth at bedtime. For sleep 02/20/14  Yes Encarnacion Slates, NP  venlafaxine  XR (EFFEXOR-XR) 150 MG 24 hr capsule Take 1 capsule (150 mg total) by mouth daily. For depression 02/20/14  Yes Encarnacion Slates, NP  gabapentin (NEURONTIN) 300 MG capsule Take 1 capsule (300 mg total) by mouth 3 (three) times daily. For substance withdrawal syndrome/agitation 02/20/14   Encarnacion Slates, NP   BP 160/93 mmHg  Pulse 85  Temp(Src) 98.7 F (37.1 C) (Oral)  Resp 18  SpO2 97% Physical Exam  Constitutional: He appears well-developed.  HENT:  Head: Atraumatic.  Cardiovascular: Normal rate.    Pulmonary/Chest: Effort normal.  Abdominal: Soft.  Musculoskeletal: Normal range of motion.  Skin: Skin is warm.  Psychiatric: His behavior is normal. Judgment normal.    ED Course  Procedures (including critical care time) Labs Review Labs Reviewed  COMPREHENSIVE METABOLIC PANEL - Abnormal; Notable for the following:    Glucose, Bld 139 (*)    All other components within normal limits  ETHANOL - Abnormal; Notable for the following:    Alcohol, Ethyl (B) 17 (*)    All other components within normal limits  ACETAMINOPHEN LEVEL - Abnormal; Notable for the following:    Acetaminophen (Tylenol), Serum <10 (*)    All other components within normal limits  SALICYLATE LEVEL  CBC  URINE RAPID DRUG SCREEN, HOSP PERFORMED    Imaging Review No results found. I have personally reviewed and evaluated these images and lab results as part of my medical decision-making.   EKG Interpretation None      MDM   Final diagnoses:  Suicidal ideations  Homicidal ideations    Patient presents with bipolar disorder and suicidal and homicidal thoughts. It is towards his ex and he does have a gun. At this time he is voluntary but I would not allow discharge, psychiatry is cleared him. IVC if necessary. He appears to be medically cleared at this time.    Davonna Belling, MD 01/05/15 [phone]

## 2015-01-05 NOTE — ED Notes (Signed)
[facility] closed the last room available d/t damage to cabinets

## 2015-01-05 NOTE — ED Notes (Addendum)
Pt reports hx of bipolar and schizophrenia. Reports SI with no plan, HI against his ex, and AH/VH, hears voices and sees things like rats. Pt reports he was taking medications while he was incarceration, was released Nov 16th, has not taken meds since then. Denies pain.   Upon further assessment, pt reports he wants to kill his ex Penelope Coop- unsure of spelling). rn asked how and pt said he would "go into the house and shoot her down". Pt reports he has a gun, would not tell rn where he has the gun.

## 2015-01-05 NOTE — ED Notes (Signed)
Informed by Jackelyn Poling, Dept. Secretary, to place telepsych equipment in pt room.  Machine taken to room.

## 2015-01-05 NOTE — ED Notes (Signed)
Pt to be moved to [facility] per Dr Alvino Chapel

## 2015-01-06 ENCOUNTER — Encounter (HOSPITAL_COMMUNITY): Payer: Self-pay

## 2015-01-06 ENCOUNTER — Inpatient Hospital Stay (HOSPITAL_COMMUNITY)
Admission: AD | Admit: 2015-01-06 | Discharge: 2015-01-12 | DRG: 885 | Disposition: A | Discharge status: Home / Self Care | Payer: Medicare Other | Source: Intra-hospital | Attending: Psychiatry | Admitting: Psychiatry

## 2015-01-06 DIAGNOSIS — F1020 Alcohol dependence, uncomplicated: Secondary | ICD-10-CM | POA: Diagnosis present

## 2015-01-06 DIAGNOSIS — E1169 Type 2 diabetes mellitus with other specified complication: Secondary | ICD-10-CM | POA: Clinically undetermined

## 2015-01-06 DIAGNOSIS — Z8673 Personal history of transient ischemic attack (TIA), and cerebral infarction without residual deficits: Secondary | ICD-10-CM

## 2015-01-06 DIAGNOSIS — R45851 Suicidal ideations: Secondary | ICD-10-CM | POA: Diagnosis present

## 2015-01-06 DIAGNOSIS — I10 Essential (primary) hypertension: Secondary | ICD-10-CM | POA: Diagnosis present

## 2015-01-06 DIAGNOSIS — F17210 Nicotine dependence, cigarettes, uncomplicated: Secondary | ICD-10-CM | POA: Diagnosis present

## 2015-01-06 DIAGNOSIS — F251 Schizoaffective disorder, depressive type: Secondary | ICD-10-CM | POA: Diagnosis present

## 2015-01-06 DIAGNOSIS — F259 Schizoaffective disorder, unspecified: Secondary | ICD-10-CM | POA: Diagnosis present

## 2015-01-06 DIAGNOSIS — F333 Major depressive disorder, recurrent, severe with psychotic symptoms: Principal | ICD-10-CM | POA: Diagnosis present

## 2015-01-06 DIAGNOSIS — R45850 Homicidal ideations: Secondary | ICD-10-CM | POA: Diagnosis present

## 2015-01-06 DIAGNOSIS — F4310 Post-traumatic stress disorder, unspecified: Secondary | ICD-10-CM | POA: Diagnosis present

## 2015-01-06 DIAGNOSIS — G4700 Insomnia, unspecified: Secondary | ICD-10-CM | POA: Diagnosis present

## 2015-01-06 DIAGNOSIS — E785 Hyperlipidemia, unspecified: Secondary | ICD-10-CM | POA: Diagnosis not present

## 2015-01-06 MED ORDER — MAGNESIUM HYDROXIDE 400 MG/5ML PO SUSP: 30.0000 mL | Freq: Every day | ORAL | Status: DC | PRN

## 2015-01-06 MED ORDER — VENLAFAXINE HCL ER 150 MG PO CP24
150.0000 mg | ORAL_CAPSULE | Freq: Every day | ORAL | Status: DC
  Administered 2015-01-07 – 2015-01-09 (×3): 150 mg via ORAL
  Filled 2015-01-06 (×5): qty 1

## 2015-01-06 MED ORDER — ACETAMINOPHEN 325 MG PO TABS
650.0000 mg | ORAL_TABLET | Freq: Four times a day (QID) | ORAL | Status: DC | PRN
  Administered 2015-01-06 – 2015-01-11 (×3): 650 mg via ORAL
  Filled 2015-01-06 (×3): qty 2

## 2015-01-06 MED ORDER — INFLUENZA VAC SPLIT QUAD 0.5 ML IM SUSY
0.5000 mL | PREFILLED_SYRINGE | INTRAMUSCULAR | Status: AC
  Administered 2015-01-08: 0.5 mL via INTRAMUSCULAR
  Filled 2015-01-06: qty 0.5

## 2015-01-06 MED ORDER — ALUM & MAG HYDROXIDE-SIMETH 200-200-20 MG/5ML PO SUSP: 30.0000 mL | ORAL | Status: DC | PRN

## 2015-01-06 MED ORDER — HYDROCHLOROTHIAZIDE 25 MG PO TABS
25.0000 mg | ORAL_TABLET | Freq: Every day | ORAL | Status: DC
  Administered 2015-01-07 – 2015-01-12 (×6): 25 mg via ORAL
  Filled 2015-01-06 (×7): qty 1

## 2015-01-06 MED ORDER — IBUPROFEN 600 MG PO TABS
600.0000 mg | ORAL_TABLET | Freq: Four times a day (QID) | ORAL | Status: DC | PRN
  Administered 2015-01-07 – 2015-01-11 (×4): 600 mg via ORAL
  Filled 2015-01-06 (×5): qty 1

## 2015-01-06 MED ORDER — PANTOPRAZOLE SODIUM 40 MG PO TBEC
40.0000 mg | DELAYED_RELEASE_TABLET | Freq: Every day | ORAL | Status: DC
  Administered 2015-01-07 – 2015-01-12 (×6): 40 mg via ORAL
  Filled 2015-01-06 (×7): qty 1

## 2015-01-06 MED ORDER — TRAZODONE HCL 100 MG PO TABS
100.0000 mg | ORAL_TABLET | Freq: Every day | ORAL | Status: DC
  Administered 2015-01-06: 100 mg via ORAL
  Filled 2015-01-06 (×4): qty 1

## 2015-01-06 NOTE — Progress Notes (Signed)
Patient admitted voluntarily reporting that he has HI towards his ex-girlfriend and his cousin. Patient reports that he was recently released from jail and found out that his ex-girlfriend had not been saving up his money like she was supposed to be doing with his debit card he left with her. He reports that he drinks normally a case of beer daily. He reports having had one previous suicide attempt. Patient currently denies si/h but reports that he sees rats running around and voices telling him to kill his cousin and ex-girlfriend. Patient had been searched by previous shift and vitals taken.

## 2015-01-06 NOTE — ED Notes (Signed)
[facility] called for transport.  Dispatcher stated they can bring the patient to [facility] prior to 6pm.

## 2015-01-06 NOTE — Tx Team (Signed)
Initial Interdisciplinary Treatment Plan   PATIENT STRESSORS: Loss of relationship ( recent b/u with girlfriend Medication change or noncompliance   PATIENT STRENGTHS: Ability for insight Motivation for treatment/growth   PROBLEM LIST: Problem List/Patient Goals Date to be addressed Date deferred Reason deferred Estimated date of resolution  HI towards his girlfriend and cousin 01/06/15     ETOH abuse (beer only)      Medication non compliance      Depression Anger issues      "Be able to control my anger and get these negative thoughts out of my head."                               DISCHARGE CRITERIA:  Improved stabilization in mood, thinking, and/or behavior  PRELIMINARY DISCHARGE PLAN: Outpatient therapy  PATIENT/FAMIILY INVOLVEMENT: This treatment plan has been presented to and reviewed with the patient, Frank Moses, and/or family membe.  The patient and family have been given the opportunity to ask questions and make suggestions.  Aurora Mask 01/06/2015, 11:19 PM

## 2015-01-06 NOTE — Progress Notes (Signed)
Nursing is notified pt is ready to be admitted at 183o. Pt was searched, belongings invnetoried, skin search complete and consent signed and pt taken to his room , for RN assessment to be completed by night shift.Pt deneis SI and contracts with this Probation officer for safety. This Probation officer gives this verbal report to oncoming night  RN, for completion of pt admission assessment.

## 2015-01-06 NOTE — Progress Notes (Signed)
Did not attend group 

## 2015-01-06 NOTE — Progress Notes (Signed)
Disposition CSW completed patient referrals to the following inpatient Psych facilities:  [facility]  CSW will continue to follow patient for placement needs.  [place] Disposition CSW [phone]

## 2015-01-06 NOTE — BHH Counselor (Addendum)
Patient has been accepted to [facility] by Charmaine Downs, NP per Debarah Crape, RN, [facility]. Patient reviewed and signed voluntary consent to treat and ROI to no one. Patient denies questions or concerns at this time. Patients paperwork has been signed and faxed to [facility] and placed in patients chart to transport via [facility]. Patients nurse and NP aware. Call nurse report to [phone].  Rosalin Hawking, LCSW Therapeutic Triage Specialist [place] 01/06/2015 5:28 PM

## 2015-01-07 DIAGNOSIS — R45850 Homicidal ideations: Secondary | ICD-10-CM

## 2015-01-07 DIAGNOSIS — F259 Schizoaffective disorder, unspecified: Secondary | ICD-10-CM

## 2015-01-07 DIAGNOSIS — R45851 Suicidal ideations: Secondary | ICD-10-CM

## 2015-01-07 MED ORDER — LISINOPRIL 5 MG PO TABS
5.0000 mg | ORAL_TABLET | Freq: Every day | ORAL | Status: DC
  Administered 2015-01-07 – 2015-01-12 (×6): 5 mg via ORAL
  Filled 2015-01-07 (×8): qty 1

## 2015-01-07 MED ORDER — ADULT MULTIVITAMIN W/MINERALS CH
1.0000 | ORAL_TABLET | Freq: Every day | ORAL | Status: DC
  Administered 2015-01-07 – 2015-01-12 (×6): 1 via ORAL
  Filled 2015-01-07 (×8): qty 1

## 2015-01-07 MED ORDER — QUETIAPINE FUMARATE 100 MG PO TABS
100.0000 mg | ORAL_TABLET | Freq: Every day | ORAL | Status: DC
  Administered 2015-01-07: 100 mg via ORAL
  Filled 2015-01-07 (×3): qty 1

## 2015-01-07 MED ORDER — CHLORDIAZEPOXIDE HCL 25 MG PO CAPS
25.0000 mg | ORAL_CAPSULE | ORAL | Status: AC
  Administered 2015-01-10 (×2): 25 mg via ORAL
  Filled 2015-01-07 (×2): qty 1

## 2015-01-07 MED ORDER — VITAMIN B-1 100 MG PO TABS
100.0000 mg | ORAL_TABLET | Freq: Every day | ORAL | Status: DC
  Administered 2015-01-08 – 2015-01-12 (×5): 100 mg via ORAL
  Filled 2015-01-07 (×6): qty 1

## 2015-01-07 MED ORDER — ONDANSETRON 4 MG PO TBDP: 4.0000 mg | ORAL_TABLET | Freq: Four times a day (QID) | ORAL | Status: AC | PRN

## 2015-01-07 MED ORDER — CHLORDIAZEPOXIDE HCL 25 MG PO CAPS
25.0000 mg | ORAL_CAPSULE | Freq: Four times a day (QID) | ORAL | Status: AC
  Administered 2015-01-07 – 2015-01-08 (×6): 25 mg via ORAL
  Filled 2015-01-07 (×6): qty 1

## 2015-01-07 MED ORDER — TRAZODONE HCL 50 MG PO TABS
50.0000 mg | ORAL_TABLET | Freq: Every day | ORAL | Status: DC
  Administered 2015-01-07: 50 mg via ORAL
  Filled 2015-01-07 (×3): qty 1

## 2015-01-07 MED ORDER — CHLORDIAZEPOXIDE HCL 25 MG PO CAPS: 25.0000 mg | ORAL_CAPSULE | Freq: Four times a day (QID) | ORAL | Status: AC | PRN

## 2015-01-07 MED ORDER — CHLORDIAZEPOXIDE HCL 25 MG PO CAPS
25.0000 mg | ORAL_CAPSULE | Freq: Three times a day (TID) | ORAL | Status: AC
  Administered 2015-01-09 (×3): 25 mg via ORAL
  Filled 2015-01-07 (×3): qty 1

## 2015-01-07 MED ORDER — LOPERAMIDE HCL 2 MG PO CAPS: 2.0000 mg | ORAL_CAPSULE | ORAL | Status: AC | PRN

## 2015-01-07 MED ORDER — HYDROXYZINE HCL 25 MG PO TABS: 25.0000 mg | ORAL_TABLET | Freq: Four times a day (QID) | ORAL | Status: AC | PRN

## 2015-01-07 MED ORDER — THIAMINE HCL 100 MG/ML IJ SOLN: 100.0000 mg | Freq: Once | INTRAMUSCULAR | Status: DC

## 2015-01-07 MED ORDER — CHLORDIAZEPOXIDE HCL 25 MG PO CAPS
25.0000 mg | ORAL_CAPSULE | Freq: Every day | ORAL | Status: AC
  Administered 2015-01-11: 25 mg via ORAL
  Filled 2015-01-07: qty 1

## 2015-01-07 NOTE — Progress Notes (Signed)
Writer has observed patient up in the dayroom interacting appropriately with peers. He has been talking quite a bit  to a male patient on the unit [facility]. He is awae of new medications added d/t increased blood pressures. He c/o feeling light headed and feeling dizzy during the day. Writer informed him to make sure he notifies staff when this occurs and he agreed. He attended group tonight and was compliant with his medications. He denies si but still has Hi toward ex girlfriend and cousin. Support given and safety maintained on unit with 15 min checks.

## 2015-01-07 NOTE — Plan of Care (Signed)
Problem: Alteration in mood & ability to function due to Goal: STG-Patient will attend groups Outcome: Progressing Patient attended wrap up group this evening and participated.

## 2015-01-07 NOTE — BHH Group Notes (Signed)
[place] Group Notes:  (Clinical Social Work)  01/07/2015  [place] Group Notes:  (Clinical Social Work)  01/07/2015  11:00AM-12:00PM  Summary of Progress/Problems:  The main focus of today's process group was to listen to a variety of genres of music and to identify that different types of music provoke different responses.  The patient then was able to identify personally what was soothing for them, as well as energizing.   The patient expressed understanding of concepts, as well as knowledge of how each type of music affected him and how this can be used at home as a wellness/recovery tool.  Type of Therapy:  Music Therapy   Participation Level:  Active  Participation Quality:  Attentive and Sharing  Affect:  Blunted and Tearful  Cognitive:  Oriented  Insight:  Engaged  Engagement in Therapy:  Engaged  Modes of Intervention:   Activity, Exploration  Selmer Dominion, LCSW 01/07/2015

## 2015-01-07 NOTE — BHH Suicide Risk Assessment (Signed)
[facility] Admission Suicide Risk Assessment   Nursing information obtained from:  Patient Demographic factors:  Male, Low socioeconomic status, Unemployed, Access to firearms Current Mental Status:  Thoughts of violence towards others Loss Factors:  Loss of significant relationship, Financial problems / change in socioeconomic status Historical Factors:  Prior suicide attempts, Family history of mental illness or substance abuse Risk Reduction Factors:  NA Total Time spent with patient: 45 minutes Principal Problem: <principal problem not specified> Diagnosis:   Patient Active Problem List   Diagnosis Date Noted  . Schizoaffective disorder, unspecified ([place]) [F25.9] 01/06/2015  . Abscess of buttock, right [L02.31]   . Rectal abscess [K61.1] 02/18/2014  . Cyst near coccyx [L05.91]   . Alcohol-induced mood disorder ([place]) [F10.94] 02/15/2014  . Suicide attempt ([place]) [T14.91] 02/15/2014  . Severe recurrent major depression without psychotic features ([place]) [F33.2] 02/15/2014  . Alcohol abuse with alcohol-induced mood disorder ([place]) [F10.14] 02/15/2014  . Hypokalemia [E87.6] 02/14/2014  . Suicidal ideation [R45.851] 02/14/2014  . Overdose [T50.901A] 02/13/2014  . Altered mental state [R41.82] 02/13/2014  . HTN (hypertension) [I10] 02/13/2014     Continued Clinical Symptoms:  Alcohol Use Disorder Identification Test Final Score (AUDIT): 28 The "Alcohol Use Disorders Identification Test", Guidelines for Use in Primary Care, Second Edition.  World Pharmacologist [facility]). Score between 0-7:  no or low risk or alcohol related problems. Score between 8-15:  moderate risk of alcohol related problems. Score between 16-19:  high risk of alcohol related problems. Score 20 or above:  warrants further diagnostic evaluation for alcohol dependence and treatment.   CLINICAL FACTORS:   Bipolar Disorder:   Mixed State Depression:   Hopelessness Impulsivity Insomnia Recent sense of  peace/wellbeing More than one psychiatric diagnosis Unstable or Poor Therapeutic Relationship Previous Psychiatric Diagnoses and Treatments   Musculoskeletal: Strength & Muscle Tone: within normal limits Gait & Station: normal Patient leans: N/A  Psychiatric Specialty Exam: Physical Exam  ROS  Blood pressure 166/98, pulse 81, temperature 98.3 F (36.8 C), temperature source Oral, resp. rate 19, height 5\' 10"  (1.778 m), weight 79.833 kg (176 lb), SpO2 99 %.Body mass index is 25.25 kg/(m^2).  General Appearance: Fairly Groomed  Engineer, water::  Fair  Speech:  Slow  Volume:  Normal  Mood:  Angry, Anxious, Dysphoric, Hopeless and Irritable  Affect:  Constricted and Depressed  Thought Process:  Coherent  Orientation:  Full (Time, Place, and Person)  Thought Content:  Hallucinations: Auditory and Rumination  Suicidal Thoughts:  Yes.  with intent/plan  Homicidal Thoughts:  Yes.  without intent/plan  Memory:  Immediate;   Fair Recent;   Fair Remote;   Fair  Judgement:  Impaired  Insight:  Shallow  Psychomotor Activity:  Decreased  Concentration:  Fair  Recall:  [employer] of Knowledge:Fair  Language: Fair  Akathisia:  No  Handed:  Right  AIMS (if indicated):     Assets:  Communication Skills Desire for Improvement  Sleep:  Number of Hours: 6.75  Cognition: WNL  ADL's:  Intact     COGNITIVE FEATURES THAT CONTRIBUTE TO RISK:  Polarized thinking and Thought constriction (tunnel vision)    SUICIDE RISK:   Moderate:  Frequent suicidal ideation with limited intensity, and duration, some specificity in terms of plans, no associated intent, good self-control, limited dysphoria/symptomatology, some risk factors present, and identifiable protective factors, including available and accessible social support.  PLAN OF CARE: Patient is 49 year old African-American man who was admitted to behavioral [facility] due to severe depression, having  suicidal thoughts with plan to shoot  himself and having homicidal thoughts towards his cousin and previous girlfriend.  Patient has been noncompliant with medications since he left prison.  He is complaining of poor sleep, irritability, anger and anhedonia.  His blood alcohol level is 17.  His UDS is negative.  Patient requires inpatient treatment and stabilization.  Please see complete history and physical and treatment plan for more details.  Medical Decision Making:  New problem, with additional work up planned, Review of Psycho-Social Stressors (1), Review or order clinical lab tests (1), Decision to obtain old records (1), Review and summation of old records (2), Established Problem, Worsening (2), New Problem, with no additional work-up planned (3), Review of Medication Regimen & Side Effects (2) and Review of New Medication or Change in Dosage (2)  I certify that inpatient services furnished can reasonably be expected to improve the patient's condition.   Frank Moses T. 01/07/2015, 2:20 PM

## 2015-01-07 NOTE — Progress Notes (Signed)
Patient ID: Frank Moses, male   DOB: Aug 01, 1965, 49 y.o.   MRN: [id number] New orders per the NP. Med ed given to patient re new orders. He denies any substance use, and states he doesn't take benzodiazapines which he was positive for on admission. He uses alcohol but level on admission was fairly low. Started on Librium and Lisinipril. He states he knows he has high BP and was taking HCTZ prior to admission. Pleasant and cooperative but complains of a lot of anxiety and has picked and bitten nails and cuticles until they are bleeding. CIWA=4 primarily due to anxiety.

## 2015-01-07 NOTE — Progress Notes (Signed)
D) Pt has attended the groups and is interacting with his peers appropriately. Pt rates his depression at a 6, hopelessness at a 7 and anxiety at a 5. Denies SI and HI. Has attended the groups and interacts with select peers.  A) Given support and reassurance. Provided with a 1:1. Encouragement provided R) Pt deneis SI and HI.
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
## 2015-01-07 NOTE — [facility] Group Notes (Signed)
[place] Group Notes:  (Nursing/MHT/Case Management/Adjunct)  Date:  01/07/2015  Time:  1000  Type of Therapy:  Nurse Education /  Life SKills : The group is focused on helping patients identify unhealthy behaviors as well as identify healthy behaviors they can incorporate to help them get their needs met.  Participation Level:  Active  Participation Quality:  Appropriate  Affect:  Appropriate  Cognitive:  Alert  Insight:  Appropriate  Engagement in Group:  Engaged  Modes of Intervention:  Discussion  Summary of Progress/Problems:  Frank Moses 01/07/2015, 12:58 PM

## 2015-01-07 NOTE — Progress Notes (Signed)
D) Pt approached this writer requesting to have his blood pressure checked. A) blood pressure checked. Dr. Michelle Piper standing by Patient when we received a reading R) Pt's blood pressure is 166/98 Physician aware

## 2015-01-07 NOTE — Plan of Care (Signed)
Problem: Alteration in mood & ability to function due to Goal: LTG-Pt reports reduction in suicidal thoughts (Patient reports reduction in suicidal thoughts and is able to verbalize a safety plan for whenever patient is feeling suicidal)  Outcome: Progressing Patient currently denies suicidal ideations and reports that he will seek staff if suicidal thoughts occur.

## 2015-01-07 NOTE — Progress Notes (Signed)
Adult Psychoeducational Group Note  Date:  01/07/2015 Time:  9:29 PM  Group Topic/Focus:  Wrap-Up Group:   The focus of this group is to help patients review their daily goal of treatment and discuss progress on daily workbooks.  Participation Level:  Active  Participation Quality:  Appropriate  Affect:  Appropriate  Cognitive:  Appropriate  Insight: Good  Engagement in Group:  Engaged  Modes of Intervention:  Activity  Additional Comments:  Patient rated his day a 4. Patient stated he was feeling down most of the day because of the Christmas music making him feel depressed. He also expressed he opened up today to others. Goal is to get rid of negative thoughts and replace them with positive ones.  Donato Heinz 01/07/2015, 9:29 PM

## 2015-01-08 ENCOUNTER — Encounter (HOSPITAL_COMMUNITY): Payer: Self-pay | Admitting: Psychiatry

## 2015-01-08 DIAGNOSIS — F333 Major depressive disorder, recurrent, severe with psychotic symptoms: Secondary | ICD-10-CM | POA: Diagnosis present

## 2015-01-08 DIAGNOSIS — F1020 Alcohol dependence, uncomplicated: Secondary | ICD-10-CM

## 2015-01-08 DIAGNOSIS — F4310 Post-traumatic stress disorder, unspecified: Secondary | ICD-10-CM

## 2015-01-08 MED ORDER — TRAZODONE HCL 50 MG PO TABS
50.0000 mg | ORAL_TABLET | Freq: Every evening | ORAL | Status: DC | PRN
  Filled 2015-01-08: qty 7

## 2015-01-08 MED ORDER — QUETIAPINE FUMARATE 200 MG PO TABS
200.0000 mg | ORAL_TABLET | Freq: Every day | ORAL | Status: DC
  Administered 2015-01-08: 200 mg via ORAL
  Filled 2015-01-08 (×3): qty 1

## 2015-01-08 MED ORDER — ENSURE ENLIVE PO LIQD
237.0000 mL | Freq: Two times a day (BID) | ORAL | Status: DC
  Administered 2015-01-09 – 2015-01-11 (×6): 237 mL via ORAL
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
## 2015-01-08 NOTE — Plan of Care (Signed)
Problem: Alteration in mood Goal: LTG-Patient reports reduction in suicidal thoughts (Patient reports reduction in suicidal thoughts and is able to verbalize a safety plan for whenever patient is feeling suicidal)  Outcome: Not Progressing Pt SI at this time, but contracts for safety  Problem: Ineffective individual coping Goal: STG: Patient will remain free from self harm Outcome: Progressing Pt safe on the unit at this time
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
## 2015-01-08 NOTE — Care Management Utilization Note (Signed)
   Per State Regulation 482.30  This chart was reviewed for necessity with respect to the patient's Admission/ Duration of stay.  Next review date: 01/10/15  Skipper Cliche RN, BSN

## 2015-01-08 NOTE — Progress Notes (Signed)
DAR NOTE: Patient presents with anxious affect and depressed mood.  Denies auditory and visual hallucinations.  Rates depression at 3, hopelessness at 4, and anxiety at 3.  Complain of neck and feet pain.  Reports withdrawal symptoms of tremors, cravings, and nausea on self inventory form.  Reports energy level as normal and concentration as poor.  Maintained on routine safety checks.  Medications given as prescribed.  Support and encouragement offered as needed.  Attended group and participated.  States goal for today is "finding a way to control my anger."  Patient observed socializing with peers in the dayroom.

## 2015-01-08 NOTE — BHH Group Notes (Signed)
[place] [facility] Group Notes:  (Counselor/Nursing/MHT/Case Management/Adjunct)  01/08/2015 1:15PM  Type of Therapy:  Group Therapy  Participation Level:  Active  Participation Quality:  Appropriate  Affect:  Flat  Cognitive:  Oriented  Insight:  Improving  Engagement in Group:  Limited  Engagement in Therapy:  Limited  Modes of Intervention:  Discussion, Exploration and Socialization  Summary of Progress/Problems: The topic for group was balance in life.  Pt participated in the discussion about when their life was in balance and out of balance and how this feels.  Pt discussed ways to get back in balance and short term goals they can work on to get where they want to be. Stayed the entire time.  Active participant.  Talked about a relationship he is in in which he feels "cared for, and like I am cared about."  Admitted it is a challenge to be balanced when he is not taking medication.  "I make decisions that are not in my best interest."   [name] 01/08/2015 2:54 PM

## 2015-01-08 NOTE — Progress Notes (Signed)
D: Pt denies HI, passive SI- contracts for safety, passive AVH- non command. Pt is pleasant and cooperative. Pt stated he was "getting better" because he was not around the woman and the man that he was having issues with before coming in . Pt stated he would possibly like LT rehab facility.   A: Pt was offered support and encouragement. Pt was given scheduled medications. Pt was encourage to attend groups. Q 15 minute checks were done for safety.   R:Pt attends groups and interacts well with peers and staff. Pt is taking medication. Pt has no complaints at this time .Pt receptive to treatment and safety maintained on unit.
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
## 2015-01-08 NOTE — Progress Notes (Signed)
NUTRITION ASSESSMENT  Pt identified as at risk on the Malnutrition Screen Tool  INTERVENTION: 1. Supplements: Ensure Enlive po BID, each supplement provides 350 kcal and 20 grams of protein  NUTRITION DIAGNOSIS: Unintentional weight loss related to sub-optimal intake as evidenced by pt report.   Goal: Pt to meet >/= 90% of their estimated nutrition needs.  Monitor:  PO intake  Assessment:  Pt admitted with depression and alcohol abuse. Pt with reported intake of 24 beers daily. Pt reports in H&P, a good appetite.  Pt has lost 34 lb since 02/18/14 (16% weight loss x 11 months).  Height: Ht Readings from Last 1 Encounters:  01/06/15 5\' 10"  (1.778 m)    Weight: Wt Readings from Last 1 Encounters:  01/06/15 176 lb (79.833 kg)    Weight Hx: Wt Readings from Last 10 Encounters:  01/06/15 176 lb (79.833 kg)  02/18/14 210 lb (95.255 kg)  02/14/14 188 lb 11.4 oz (85.6 kg)  01/31/14 190 lb (86.183 kg)  09/14/13 195 lb (88.451 kg)  02/27/12 197 lb (89.359 kg)  11/11/11 190 lb (86.183 kg)  10/20/11 195 lb (88.451 kg)  08/19/11 197 lb (89.359 kg)  02/20/11 200 lb (90.719 kg)    BMI:  Body mass index is 25.25 kg/(m^2). Pt meets criteria for overweight based on current BMI.  Estimated Nutritional Needs: Kcal: 25-30 kcal/kg Protein: > 1 gram protein/kg Fluid: 1 ml/kcal  Diet Order:   Pt is also offered choice of unit snacks mid-morning and mid-afternoon.  Pt is eating as desired.   Lab results and medications reviewed.   Clayton Bibles, MS, RD, LDN Pager: [phone] After Hours Pager: [phone]

## 2015-01-09 DIAGNOSIS — E785 Hyperlipidemia, unspecified: Secondary | ICD-10-CM

## 2015-01-09 DIAGNOSIS — E1169 Type 2 diabetes mellitus with other specified complication: Secondary | ICD-10-CM | POA: Clinically undetermined

## 2015-01-09 DIAGNOSIS — F4310 Post-traumatic stress disorder, unspecified: Secondary | ICD-10-CM | POA: Clinically undetermined

## 2015-01-09 LAB — LIPID PANEL
Cholesterol: 213 mg/dL — ABNORMAL HIGH (ref 0–200)
HDL: 36 mg/dL — ABNORMAL LOW (ref >40)
LDL CALC: 101 mg/dL — AB (ref 0–99)
TRIGLYCERIDES: 378 mg/dL — AB (ref <150)
Total CHOL/HDL Ratio: 5.9 RATIO
VLDL: 76 mg/dL — ABNORMAL HIGH (ref 0–40)

## 2015-01-09 MED ORDER — NICOTINE 21 MG/24HR TD PT24
21.0000 mg | MEDICATED_PATCH | Freq: Every day | TRANSDERMAL | Status: DC
  Administered 2015-01-10 – 2015-01-12 (×3): 21 mg via TRANSDERMAL
  Filled 2015-01-09 (×5): qty 1

## 2015-01-09 MED ORDER — VENLAFAXINE HCL ER 37.5 MG PO CP24
187.5000 mg | ORAL_CAPSULE | Freq: Every day | ORAL | Status: DC
  Administered 2015-01-10 – 2015-01-11 (×2): 187.5 mg via ORAL
  Filled 2015-01-09 (×4): qty 5

## 2015-01-09 MED ORDER — QUETIAPINE FUMARATE 400 MG PO TABS
400.0000 mg | ORAL_TABLET | Freq: Every day | ORAL | Status: DC
  Administered 2015-01-09 – 2015-01-11 (×3): 400 mg via ORAL
  Filled 2015-01-09 (×4): qty 1

## 2015-01-09 MED ORDER — SIMVASTATIN 20 MG PO TABS
20.0000 mg | ORAL_TABLET | Freq: Every day | ORAL | Status: DC
  Administered 2015-01-09 – 2015-01-11 (×3): 20 mg via ORAL
  Filled 2015-01-09 (×4): qty 1

## 2015-01-09 NOTE — Progress Notes (Signed)
DAR NOTE: Patient presents with anxious affect and depressed mood.  Denies pain, auditory and visual hallucinations.  Rates depression at 6 , hopelessness at 7, and anxiety at 7.  Maintained on routine safety checks.  Medications given as prescribed.  Support and encouragement offered as needed.  Attended group and participated.  States goal for today is " to go home".  Patient observed socializing with peers in the dayroom.  Pt's safety ensured with 15 minute and environmental checks. Pt currently denies SI/HI and A/V hallucinations. Pt verbally agrees to seek staff if SI/HI or A/VH occurs and to consult with staff before acting on these thoughts. Will continue POC.

## 2015-01-09 NOTE — BHH Group Notes (Signed)
[place] Group Notes:  (Nursing/MHT/Case Management/Adjunct)  Date:  01/09/2015  Time:  3:18 PM  Type of Therapy:  Psychoeducational Skills  Participation Level:  Did Not Attend  Participation Quality:  DID NOT ATTEND  Affect:  DID NOT ATTEND  Cognitive:  DID NOT ATTEND  Insight:  None  Engagement in Group:  DID NOT ATTEND  Modes of Intervention:  DID NOT ATTEND  Summary of Progress/Problems: Pt did not attend patient self inventory group.  Wolfgang Phoenix 01/09/2015, 3:18 PM
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
## 2015-01-09 NOTE — BHH Group Notes (Signed)
[place] [facility] LCSW Group Therapy  01/09/2015 , 1:22 PM   Type of Therapy:  Group Therapy  Participation Level:  Active  Participation Quality:  Attentive  Affect:  Appropriate  Cognitive:  Alert  Insight:  Improving  Engagement in Therapy:  Engaged  Modes of Intervention:  Discussion, Exploration and Socialization  Summary of Progress/Problems: Today's group focused on the term Diagnosis.  Participants were asked to define the term, and then pronounce whether it is a negative, positive or neutral term.  Invited.  Chose to not attend.  Roque Lias B 01/09/2015 , 1:22 PM

## 2015-01-10 LAB — PROLACTIN: Prolactin: 8.3 ng/mL (ref 4.0–15.2)

## 2015-01-10 LAB — HEMOGLOBIN A1C
HEMOGLOBIN A1C: 6 % — AB (ref 4.8–5.6)
MEAN PLASMA GLUCOSE: 126 mg/dL

## 2015-01-10 NOTE — Progress Notes (Signed)
Patient ID: Frank Moses, male   DOB: Dec 27, 1965, 49 y.o.   MRN: [id number] PER STATE REGULATIONS 482.30  THIS CHART WAS REVIEWED FOR MEDICAL NECESSITY WITH RESPECT TO THE PATIENT'S ADMISSION/ DURATION OF STAY.  NEXT REVIEW DATE: 01/14/2015  Chauncy Lean, RN, BSN CASE MANAGER'

## 2015-01-10 NOTE — Plan of Care (Signed)
Problem: Food- and Nutrition-Related Knowledge Deficit (NB-1.1) Goal: Nutrition education Formal process to instruct or train a patient/client in a skill or to impart knowledge to help patients/clients voluntarily manage or modify food choices and eating behavior to maintain or improve health. Outcome: Completed/Met Date Met:  01/10/15 Nutrition Education Note  RD consulted for nutrition education regarding hyperlipidemia.  Lipid Panel     Component Value Date/Time    CHOL 213* 01/09/2015 0640    TRIG 378* 01/09/2015 0640    HDL 36* 01/09/2015 0640    CHOLHDL 5.9 01/09/2015 0640    VLDL 76* 01/09/2015 0640    LDLCALC 101* 01/09/2015 0640    RD provided "High Triglycerides Nutrition Therapy" handout from the Academy of Nutrition and Dietetics. Reviewed patient's dietary recall. Provided examples on ways to decrease sugar and fat intake in diet. Discouraged intake of processed foods and consumption of sugar-sweetened beverages. Encouraged fresh fruits and vegetables as well as whole grain sources of carbohydrates to maximize fiber intake. Teach back method used.  Expect fair compliance. Pt reports 20 lb of weight loss PTA. Pt drinking his Ensure supplement during education.  Body mass index is 25.25 kg/(m^2). Pt meets criteria for overweight based on current BMI.  Diet Order:   Pt is also offered choice of unit snacks mid-morning and mid-afternoon.  Pt is eating as desired.   Labs and medications reviewed. No further nutrition interventions warranted at this time. If additional nutrition issues arise, please re-consult RD.  Clayton Bibles, MS, RD, LDN Pager: [phone] After Hours Pager: [phone]

## 2015-01-10 NOTE — BHH Group Notes (Signed)
[facility] Group Notes:  (Counselor/Nursing/MHT/Case Management/Adjunct)  01/10/2015 1:15PM  Type of Therapy:  Group Therapy  Participation Level:  Active  Participation Quality:  Appropriate  Affect:  Flat  Cognitive:  Oriented  Insight:  Improving  Engagement in Group:  Limited  Engagement in Therapy:  Limited  Modes of Intervention:  Discussion, Exploration and Socialization  Summary of Progress/Problems: The topic for group was balance in life.  Pt participated in the discussion about when their life was in balance and out of balance and how this feels.  Pt discussed ways to get back in balance and short term goals they can work on to get where they want to be. Stayed for the entire time.  Was engaged throughout, but quiet.  At one point gave feedback to another patient, encouraging her to "[name] someone with kindness instead of hatred" and to another one, "I've been incarcerated too, and this is way better than jail."  Talked about his hopes of getting into rehab.   Frank Moses 01/10/2015 3:28 PM

## 2015-01-10 NOTE — Progress Notes (Signed)
  D: When asked about his day pt stated, "it's been kinda up and down today. Trying to figure out where I'm going when I get out of here." Stated, he gets a disability check, but still isn't sure what he's going to do. Pt has no other questions or concerns.    A:  Support and encouragement was offered. 15 min checks continued for safety.  R: Pt remains safe.

## 2015-01-10 NOTE — Plan of Care (Signed)
Problem: Aggression Towards others,Towards Self, and or Destruction Goal: STG-Patient will comply with prescribed medication regimen (Patient will comply with prescribed medication regimen)  Outcome: Progressing Pt take his medications as scheduled.

## 2015-01-10 NOTE — Progress Notes (Signed)
DAR NOTE: Patient presents with anxious affect and depressed mood.  Denies pain, auditory and visual hallucinations.  Rates depression at 10, hopelessness at 10, and anxiety at 10.  Maintained on routine safety checks.  Medications given as prescribed.  Support and encouragement offered as needed.  Attended group and participated.  States goal for today is " figuring out where I'm going to go and stay."  Patient observed socializing with peers in the dayroom.  Offered no complaint.

## 2015-01-10 NOTE — BHH Group Notes (Signed)
[facility] LCSW Aftercare Discharge Planning Group Note   01/10/2015 12:23 PM  Participation Quality:  Invited.  In bed, asleep, with 2 full cups of coffee by bed.  Did not respond.    Frank Moses
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
## 2015-01-11 MED ORDER — VENLAFAXINE HCL ER 75 MG PO CP24
225.0000 mg | ORAL_CAPSULE | Freq: Every day | ORAL | Status: DC
  Administered 2015-01-12: 225 mg via ORAL
  Filled 2015-01-11 (×2): qty 1

## 2015-01-11 NOTE — Progress Notes (Signed)
[facility] MD Progress Note  01/11/2015 2:34 PM Frank Moses  MRN:  [id number] Subjective: Patient states " I am fine ,I still get irritable sometimes.'    Objective:Frank Moses is an 49 y.o. male who presented accompanied by his cousin reporting symptoms of homicidal ideation, depression and suicidal ideation.Pt was noncompliant on his effexor prior to admission due to being incarcerated after assault charges. Patient seen and chart reviewed.Discussed patient with treatment team.  Pt today continues to be irritable - states he cannot cope with his anger at times and anything minimal can provoke him.  Pt continues to ruminate about his stressors and is still motivated to get help with his substance abuse issues. Pt per staff - did make a comment today that he will go in to the woods and kill self. Will continue to monitor patient on the unit.. Pt did not express any withdrawal sx today . CIWA/VS reviewed.    Principal Problem: MDD (major depressive disorder), recurrent, severe, with psychosis ([place])  Diagnosis:   Patient Active Problem List   Diagnosis Date Noted  . PTSD (post-traumatic stress disorder) [F43.10] 01/09/2015  . Hyperlipidemia [E78.5] 01/09/2015  . MDD (major depressive disorder), recurrent, severe, with psychosis ([place]) [F33.3] 01/08/2015  . Alcohol use disorder, severe, dependence ([place]) [F10.20] 01/08/2015  . HTN (hypertension) [I10] 02/13/2014   Total Time spent with patient: 25 minutes  Past Psychiatric History: Pt does have a hx of alcohol use do as well as MDD. Pt was admitted at [facility] Pt was noncompliant on his medications.Pt also has a hx of OD on pills in an attempt to commit suicide - January 2016.    Past Medical History:  Past Medical History  Diagnosis Date  . Hypertension   . Arthritis   . Bipolar 1 disorder ([place])   . Schizophrenia ([place])   . Depression   . Stroke ([place])   . Asthma   . Peptic ulcer      Past Surgical History  Procedure Laterality Date  . Cervical fusion    . Bil foot surgery     Family History: pt denies any significant medical issues in family. Family History  Problem Relation Age of Onset  . Alcoholism Father    Family Psychiatric  History: Pt reports that his father has a hx of alcoholism. Pt denies any suicide in family. Social History:  History  Alcohol Use  . 14.4 oz/week  . 24 Cans of beer per week     History  Drug Use No    Social History   Social History  . Marital Status: Single    Spouse Name: N/A  . Number of Children: N/A  . Years of Education: N/A   Social History Main Topics  . Smoking status: Current Every Day Smoker -- 1.00 packs/day    Types: Cigarettes  . Smokeless tobacco: None  . Alcohol Use: 14.4 oz/week    24 Cans of beer per week  . Drug Use: No  . Sexual Activity: Yes    Birth Control/ Protection: None   Other Topics Concern  . None   Social History Narrative   Additional Social History:                         Sleep: Fair  Appetite:  Fair  Current Medications: Current Facility-Administered Medications  Medication Dose Route Frequency Provider Last Rate Last Dose  . acetaminophen (TYLENOL) tablet  650 mg  650 mg Oral Q6H PRN Delfin Gant, NP   650 mg at 01/11/15 0830  . alum & mag hydroxide-simeth (MAALOX/MYLANTA) 200-200-20 MG/5ML suspension 30 mL  30 mL Oral Q4H PRN Delfin Gant, NP      . feeding supplement (ENSURE ENLIVE) (ENSURE ENLIVE) liquid 237 mL  237 mL Oral BID BM Clayton Bibles, RD   237 mL at 01/11/15 1413  . hydrochlorothiazide (HYDRODIURIL) tablet 25 mg  25 mg Oral Daily Delfin Gant, NP   25 mg at 01/11/15 [id number]  . ibuprofen (ADVIL,MOTRIN) tablet 600 mg  600 mg Oral Q6H PRN Lurena Nida, NP   600 mg at 01/08/15 2214  . lisinopril (PRINIVIL,ZESTRIL) tablet 5 mg  5 mg Oral Daily Derrill Center, NP   5 mg at 01/11/15 [id number]  . magnesium hydroxide (MILK OF MAGNESIA) suspension  30 mL  30 mL Oral Daily PRN Delfin Gant, NP      . multivitamin with minerals tablet 1 tablet  1 tablet Oral Daily Derrill Center, NP   1 tablet at 01/11/15 [phone]  . nicotine (NICODERM CQ - dosed in mg/24 hours) patch 21 mg  21 mg Transdermal Q0600 Ursula Alert, MD   21 mg at 01/11/15 0832  . pantoprazole (PROTONIX) EC tablet 40 mg  40 mg Oral Daily Delfin Gant, NP   40 mg at 01/11/15 [id number]  . QUEtiapine (SEROQUEL) tablet 400 mg  400 mg Oral QHS Ursula Alert, MD   400 mg at 01/10/15 2124  . simvastatin (ZOCOR) tablet 20 mg  20 mg Oral q1800 Ursula Alert, MD   20 mg at 01/10/15 1722  . thiamine (B-1) injection 100 mg  100 mg Intramuscular Once Derrill Center, NP   100 mg at 01/07/15 1641  . thiamine (VITAMIN B-1) tablet 100 mg  100 mg Oral Daily Derrill Center, NP   100 mg at 01/11/15 [id number]  . traZODone (DESYREL) tablet 50 mg  50 mg Oral QHS PRN Ursula Alert, MD      . Derrill Memo ON 01/12/2015] venlafaxine XR (EFFEXOR-XR) 24 hr capsule 225 mg  225 mg Oral Daily Shahrukh Pasch, MD        Lab Results:  No results found for this or any previous visit (from the past 48 hour(s)).  Physical Findings: AIMS: Facial and Oral Movements Muscles of Facial Expression: None, normal Lips and Perioral Area: None, normal Jaw: None, normal Tongue: None, normal,Extremity Movements Upper (arms, wrists, hands, fingers): None, normal Lower (legs, knees, ankles, toes): None, normal, Trunk Movements Neck, shoulders, hips: None, normal, Overall Severity Severity of abnormal movements (highest score from questions above): None, normal Incapacitation due to abnormal movements: None, normal Patient's awareness of abnormal movements (rate only patient's report): No Awareness, Dental Status Current problems with teeth and/or dentures?: No Does patient usually wear dentures?: No  CIWA:  CIWA-Ar Total: 0 COWS:     Musculoskeletal: Strength & Muscle Tone: within normal limits Gait & Station:  normal Patient leans: N/A  Psychiatric Specialty Exam: Review of Systems  Psychiatric/Behavioral: Positive for depression, hallucinations and substance abuse. The patient is nervous/anxious.   All other systems reviewed and are negative.   Blood pressure 107/77, pulse 118, temperature 98 F (36.7 C), temperature source Oral, resp. rate 20, height 5\' 10"  (1.778 m), weight 79.833 kg (176 lb), SpO2 99 %.Body mass index is 25.25 kg/(m^2).  General Appearance: Fairly Groomed  Engineer, water::  Poor  Speech:  Normal Rate  Volume:  Decreased  Mood:  Anxious, Depressed and Irritable  Affect:  Constricted  Thought Process:  Goal Directed  Orientation:  Full (Time, Place, and Person)  Thought Content:  Paranoid Ideation and Rumination improving  Suicidal Thoughts:  No to Probation officer , however reported to CSW that he wanted to go and kill self in the woods .  Homicidal Thoughts:  No  Memory:  Immediate;   Fair Recent;   Fair Remote;   Fair  Judgement:  Impaired  Insight:  Fair  Psychomotor Activity:  Restlessness  Concentration:  Poor  Recall:  [employer] of Knowledge:Fair  Language: Fair  Akathisia:  No  Handed:  Right  AIMS (if indicated):     Assets:  Desire for Improvement  ADL's:  Intact  Cognition: WNL  Sleep:  Number of Hours: 6.75   Treatment Plan Summary: Patient with hx of depression as well as alcohol abuse , presented with depression, paranoia and sleep issues. Pt today continues to be depressed , irritable- will continue treatment.   Daily contact with patient to assess and evaluate symptoms and progress in treatment and Medication management    Increased Effexor XR to 225 mg po daily for affective sx. Pt reports he was noncompliant due to being incarcerated which led to current decompensation. Will continue Seroquel 400 mg po qhs for psychosis as well as to augment the effect of effexor. Will make Trazodone 50 mg po qhs prn , since seroquel will also help sleep. Continue  CIWA/Libirum protocol for alcohol abuse/withdrawal sx.  Will continue to monitor vitals ,medication compliance and treatment side effects while patient is here.  Will monitor for medical issues as well as call consult as needed.  Patient with hyperlipidemia - started on  Zocor 20 mg po daily for hyperlipidemia.As well as consulted dietician. Reviewed labs- Hba1c- 6 , PL- wnl  . EKG - reviewed- qtc -wnl. CSW will start working on disposition. Pt to be referred to a substance abuse program for further management of his substance abuse problems. Patient to participate in therapeutic milieu .       Bently Morath MD 01/11/2015, 2:34 PM

## 2015-01-11 NOTE — Progress Notes (Signed)
D: Patient alert and oriented x 4. Patient denies pain/SI/HI/AVH. During assessment patient was answering questions with short answers. Patient later came and apologized to this writer stating he was just in a bad mood from what had happened at dinner. This Probation officer told patient it was ok that we all have bad days.  A: Staff to monitor Q 15 mins for safety. Encouragement and support offered. Scheduled medications administered per orders. R: Patient remains safe on the unit. Patient attended group tonight. Patient visible on hte unit and interacting with peers. Patient taking administered medications.

## 2015-01-11 NOTE — [facility] Group Notes (Signed)
[facility] Group Notes:  (Nursing/MHT/Case Management/Adjunct)  Date:  01/11/2015   Time:  0930 Type of Therapy:  Nurse Education  Participation Level:  Active  Participation Quality:  Appropriate and Attentive  Affect:  Appropriate  Cognitive:  Alert and Appropriate  Insight:  Appropriate and Good  Engagement in Group:  Engaged  Modes of Intervention:  Activity, Discussion, Education and Exploration  Summary of Progress/Problems: Topic was on leisure and lifestyle changes. Discussed the importance of choosing a healthy leisure activities. Group encouraged to surround themselves with positive and healthy group/support system when changing to a healthy lifestyle. Patient was receptive and contributed. Patient states, "trying to deal with his anger issues and learning to react the right way." Frank Moses 12/14/2014, 1:22 PM    Frank Moses 01/11/2015, 0930
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
## 2015-01-11 NOTE — Progress Notes (Signed)
DAR NOTE: Patient presents with anxious affect and depressed mood.  Denies auditory and visual hallucinations.  Rates depression at 2, hopelessness at 3, and anxiety at 4.  Maintained on routine safety checks.  Reports energy level as low and concentration level as poor.  Medications given as prescribed.  Support and encouragement offered as needed.  Attended group and participated.  States goal for today is "work on my anger issue and.  Get back in society and make a better life for myself and children."  Patient observed socializing with peers in the dayroom.  Complain of left hip pain.  Received Tylenol 650 mg with good effect.

## 2015-01-11 NOTE — BHH Suicide Risk Assessment (Signed)
[place] INPATIENT:  Family/Significant Other Suicide Prevention Education  Suicide Prevention Education:  Education Completed; Rolan Bucco, cousin, [phone] has been identified by the patient as the family member/significant other with whom the patient will be residing, and identified as the person(s) who will aid the patient in the event of a mental health crisis (suicidal ideations/suicide attempt).  With written consent from the patient, the family member/significant other has been provided the following suicide prevention education, prior to the and/or following the discharge of the patient.  The suicide prevention education provided includes the following:  Suicide risk factors  Suicide prevention and interventions  National Suicide Hotline telephone number  [facility] assessment telephone number  [facility] Emergency Assistance [place] and/or Residential Mobile Crisis Unit telephone number  Request made of family/significant other to:  Remove weapons (e.g., guns, rifles, knives), all items previously/currently identified as safety concern.    Remove drugs/medications (over-the-counter, prescriptions, illicit drugs), all items previously/currently identified as a safety concern.  The family member/significant other verbalizes understanding of the suicide prevention education information provided.  The family member/significant other agrees to remove the items of safety concern listed above.  Roque Lias B 01/11/2015, 5:13 PM

## 2015-01-11 NOTE — Progress Notes (Signed)
D: Patient alert and oriented x 4. Patient denies pain/SI/HI/AVH at the time of assessment. Patient stated he was having some pain from an old surgical scar this morning.  At 2012 patient come to this writer stating he had a headache 8/10 in pain. PRN dose Ibuprofen 600 mg was given. With effective results.  A: Staff to monitor Q 15 mins for safety. Encouragement and support offered. Scheduled medications administered per orders. R: Patient remains safe on the unit. Patient attended group tonight. Patient visible on hte unit and interacting with peers. Patient taking administered medications.

## 2015-01-11 NOTE — BHH Group Notes (Signed)
[place] LCSW Group Therapy  01/11/2015 3:39 PM   Type of Therapy:  Group Therapy  Participation Level:  Active  Participation Quality:  Attentive  Affect:  Appropriate  Cognitive:  Appropriate  Insight:  Improving  Engagement in Therapy:  Engaged  Modes of Intervention:  Clarification, Education, Exploration and Socialization  Summary of Progress/Problems: Today's group focused on relapse prevention.  We defined the term, and then brainstormed on ways to prevent relapse.  Stayed the entire time.  Talked about both mental health relapse "I stop talking my meds and I get really angry and lash out" and and substance abuse "One beer generally leads to 24."  Talked about his desire to relocate to [facility] when he gets his check next month.  Also, looking forward to seeing cousin tomorrow when he discharges.  Plans on attending church with her.  "I used to go and sing in the choir.  I was doing really well back then."  [name] 01/11/2015 , 3:39 PM

## 2015-01-12 MED ORDER — VENLAFAXINE HCL ER 75 MG PO CP24
225.0000 mg | ORAL_CAPSULE | Freq: Every day | ORAL | Status: DC
  Filled 2015-01-12 (×2): qty 21

## 2015-01-12 MED ORDER — SIMVASTATIN 20 MG PO TABS: 20.0000 mg | ORAL_TABLET | Freq: Every day | ORAL | Status: DC

## 2015-01-12 MED ORDER — VENLAFAXINE HCL ER 75 MG PO CP24
225.0000 mg | ORAL_CAPSULE | Freq: Every day | ORAL | Status: DC
Start: 2015-01-12 — End: 2016-09-22

## 2015-01-12 MED ORDER — QUETIAPINE FUMARATE 400 MG PO TABS: 400.0000 mg | ORAL_TABLET | Freq: Every day | ORAL | Status: DC

## 2015-01-12 MED ORDER — TRAZODONE HCL 50 MG PO TABS: 50.0000 mg | ORAL_TABLET | Freq: Every evening | ORAL | Status: DC | PRN

## 2015-01-12 MED ORDER — NICOTINE 21 MG/24HR TD PT24: 21.0000 mg | MEDICATED_PATCH | Freq: Every day | TRANSDERMAL | Status: DC

## 2015-01-12 MED ORDER — LISINOPRIL 5 MG PO TABS: 5.0000 mg | ORAL_TABLET | Freq: Every day | ORAL | Status: DC

## 2015-01-12 MED ORDER — PANTOPRAZOLE SODIUM 40 MG PO TBEC: 40.0000 mg | DELAYED_RELEASE_TABLET | Freq: Every day | ORAL | Status: DC

## 2015-01-12 MED ORDER — HYDROCHLOROTHIAZIDE 25 MG PO TABS
25.0000 mg | ORAL_TABLET | Freq: Every day | ORAL | Status: DC
Start: 2015-01-12 — End: 2018-08-23
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
## 2015-01-12 NOTE — BHH Suicide Risk Assessment (Signed)
[facility] Discharge Suicide Risk Assessment   Demographic Factors:  Male  Total Time spent with patient: 30 minutes  Musculoskeletal: Strength & Muscle Tone: within normal limits Gait & Station: normal Patient leans: N/A  Psychiatric Specialty Exam: Physical Exam  Review of Systems  Psychiatric/Behavioral: Positive for substance abuse. Negative for depression and hallucinations. The patient is not nervous/anxious.   All other systems reviewed and are negative.   Blood pressure 118/77, pulse 109, temperature 97.5 F (36.4 C), temperature source Oral, resp. rate 18, height 5\' 10"  (1.778 m), weight 79.833 kg (176 lb), SpO2 99 %.Body mass index is 25.25 kg/(m^2).  General Appearance: Casual  Eye Contact::  Fair  Speech:  Clear and [id number]  Volume:  Normal  Mood:  Euthymic  Affect:  Appropriate  Thought Process:  Coherent  Orientation:  Full (Time, Place, and Person)  Thought Content:  WDL  Suicidal Thoughts:  No  Homicidal Thoughts:  No  Memory:  Immediate;   Fair Recent;   Fair Remote;   Fair  Judgement:  Fair  Insight:  Fair  Psychomotor Activity:  Normal  Concentration:  Fair  Recall:  [employer] of Knowledge:Fair  Language: Fair  Akathisia:  No  Handed:  Right  AIMS (if indicated):     Assets:  Communication Skills Desire for Improvement  Sleep:  Number of Hours: 6.5  Cognition: WNL  ADL's:  Intact   Have you used any form of tobacco in the last 30 days? (Cigarettes, Smokeless Tobacco, Cigars, and/or Pipes): Yes  Has this patient used any form of tobacco in the last 30 days? (Cigarettes, Smokeless Tobacco, Cigars, and/or Pipes) Yes, Prescription  provided  FOR NICOTINE PATCH  Mental Status Per Nursing Assessment::   On Admission:  Thoughts of violence towards others  Current Mental Status by Physician: PT DENIES SI/HI/AH/VH  Loss Factors: Legal issues  Historical Factors: Impulsivity  Risk Reduction Factors:   Positive social support  Continued  Clinical Symptoms:  Alcohol/Substance Abuse/Dependencies Unstable or Poor Therapeutic Relationship  Cognitive Features That Contribute To Risk:  Polarized thinking    Suicide Risk:  Minimal: No identifiable suicidal ideation.  Patients presenting with no risk factors but with morbid ruminations; may be classified as minimal risk based on the severity of the depressive symptoms  Principal Problem: MDD (major depressive disorder), recurrent, severe, with psychosis [facility]) Discharge Diagnoses:  Patient Active Problem List   Diagnosis Date Noted  . PTSD (post-traumatic stress disorder) [F43.10] 01/09/2015  . Hyperlipidemia [E78.5] 01/09/2015  . MDD (major depressive disorder), recurrent, severe, with psychosis ([place]) [F33.3] 01/08/2015  . Alcohol use disorder, severe, dependence ([place]) [F10.20] 01/08/2015  . HTN (hypertension) [I10] 02/13/2014    Follow-up Information    Follow up with [facility] On 01/23/2015.   Why:  Tuesday at [facility] for your hospital follow up appointment   Contact information:   [place] Of Care/Follow-up recommendations:  Activity:  No restrictions Diet:  regular Tests:  as needed Other:  follow up with after care  Is patient on multiple antipsychotic therapies at discharge:  No   Has Patient had three or more failed trials of antipsychotic monotherapy by history:  No  Recommended Plan for Multiple Antipsychotic Therapies: NA    Kylee Nardozzi MD 01/12/2015, 9:55 AM

## 2015-01-12 NOTE — Progress Notes (Signed)
Patient discharged home with samples and prescriptions. Patient was stable and appreciative at that time. All papers and prescriptions were given and valuables returned. Verbal understanding expressed. Denies SI/HI and A/VH. Patient given opportunity to express concerns and ask questions.

## 2015-01-12 NOTE — Progress Notes (Signed)
  [facility] Adult Case Management Discharge Plan :  Will you be returning to the same living situation after discharge:  Yes,  with cousin At discharge, do you have transportation home?: Yes,  [place] Do you have the ability to pay for your medications: Yes,  mental health  Release of information consent forms completed and in the chart;  Patient's signature needed at discharge.  Patient to Follow up at: Follow-up Information    Follow up with [facility] On 01/23/2015.   Why:  Tuesday at [place] for your hospital follow up appointment   Contact information:   [name]      Next level of care provider has access to [facility] Link:no  Patient denies SI/HI: Yes,  yes    Safety Planning and Suicide Prevention discussed: Yes,  yes  Have you used any form of tobacco in the last 30 days? (Cigarettes, Smokeless Tobacco, Cigars, and/or Pipes): Yes  Has patient been referred to the Quitline?: Yes, faxed on 01/12/15  Patient has been referred for addiction treatment: Yes  Frank Moses 01/12/2015, 8:32 AM

## 2015-05-29 DIAGNOSIS — Z9889 Other specified postprocedural states: Secondary | ICD-10-CM | POA: Insufficient documentation

## 2015-06-13 ENCOUNTER — Ambulatory Visit: Payer: Medicare Other | Attending: Physician Assistant | Admitting: Physical Therapy

## 2015-06-13 DIAGNOSIS — M542 Cervicalgia: Secondary | ICD-10-CM | POA: Insufficient documentation

## 2015-06-13 DIAGNOSIS — M25512 Pain in left shoulder: Secondary | ICD-10-CM | POA: Diagnosis present
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
## 2015-06-14 ENCOUNTER — Ambulatory Visit: Payer: Medicare Other | Admitting: Physical Therapy

## 2015-06-14 DIAGNOSIS — M25512 Pain in left shoulder: Secondary | ICD-10-CM

## 2015-06-14 DIAGNOSIS — M542 Cervicalgia: Secondary | ICD-10-CM | POA: Diagnosis not present
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
## 2015-06-19 ENCOUNTER — Ambulatory Visit: Payer: Medicare Other | Admitting: Physical Therapy

## 2015-06-21 ENCOUNTER — Encounter: Payer: Self-pay | Admitting: Physical Therapy

## 2015-11-20 ENCOUNTER — Emergency Department (HOSPITAL_COMMUNITY): Payer: Medicare Other

## 2015-11-20 ENCOUNTER — Emergency Department (HOSPITAL_COMMUNITY)
Admission: EM | Admit: 2015-11-20 | Discharge: 2015-11-20 | Disposition: A | Discharge status: Home / Self Care | Payer: Medicare Other | Attending: Emergency Medicine | Admitting: Emergency Medicine

## 2015-11-20 ENCOUNTER — Encounter (HOSPITAL_COMMUNITY): Payer: Self-pay | Admitting: Emergency Medicine

## 2015-11-20 DIAGNOSIS — I10 Essential (primary) hypertension: Secondary | ICD-10-CM | POA: Insufficient documentation

## 2015-11-20 DIAGNOSIS — J019 Acute sinusitis, unspecified: Secondary | ICD-10-CM | POA: Diagnosis not present

## 2015-11-20 DIAGNOSIS — Z79899 Other long term (current) drug therapy: Secondary | ICD-10-CM | POA: Insufficient documentation

## 2015-11-20 DIAGNOSIS — R51 Headache: Secondary | ICD-10-CM | POA: Diagnosis present

## 2015-11-20 DIAGNOSIS — F17210 Nicotine dependence, cigarettes, uncomplicated: Secondary | ICD-10-CM | POA: Insufficient documentation

## 2015-11-20 DIAGNOSIS — J45909 Unspecified asthma, uncomplicated: Secondary | ICD-10-CM | POA: Insufficient documentation

## 2015-11-20 DIAGNOSIS — R519 Headache, unspecified: Secondary | ICD-10-CM

## 2015-11-20 LAB — URINALYSIS, ROUTINE W REFLEX MICROSCOPIC
BILIRUBIN URINE: NEGATIVE
Glucose, UA: NEGATIVE mg/dL
HGB URINE DIPSTICK: NEGATIVE
Ketones, ur: NEGATIVE mg/dL
Leukocytes, UA: NEGATIVE
Nitrite: NEGATIVE
PH: 6 (ref 5.0–8.0)
Protein, ur: NEGATIVE mg/dL
SPECIFIC GRAVITY, URINE: 1.015 (ref 1.005–1.030)

## 2015-11-20 LAB — CBC WITH DIFFERENTIAL/PLATELET
BASOS ABS: 0 10*3/uL (ref 0.0–0.1)
BASOS PCT: 1 %
EOS PCT: 6 %
Eosinophils Absolute: 0.3 10*3/uL (ref 0.0–0.7)
HEMATOCRIT: 42 % (ref 39.0–52.0)
Hemoglobin: 14.8 g/dL (ref 13.0–17.0)
Lymphocytes Relative: 38 %
Lymphs Abs: 2.1 10*3/uL (ref 0.7–4.0)
MCH: 31.6 pg (ref 26.0–34.0)
MCHC: 35.2 g/dL (ref 30.0–36.0)
MCV: 89.6 fL (ref 78.0–100.0)
MONOS PCT: 14 %
Monocytes Absolute: 0.8 10*3/uL (ref 0.1–1.0)
Neutro Abs: 2.3 10*3/uL (ref 1.7–7.7)
Neutrophils Relative %: 41 %
PLATELETS: 156 10*3/uL (ref 150–400)
RBC: 4.69 MIL/uL (ref 4.22–5.81)
RDW: 14.3 % (ref 11.5–15.5)
WBC: 5.6 10*3/uL (ref 4.0–10.5)

## 2015-11-20 LAB — HEPATIC FUNCTION PANEL
ALT: 23 U/L (ref 17–63)
AST: 28 U/L (ref 15–41)
Albumin: 3.7 g/dL (ref 3.5–5.0)
Alkaline Phosphatase: 67 U/L (ref 38–126)
BILIRUBIN DIRECT: 0.1 mg/dL (ref 0.1–0.5)
BILIRUBIN INDIRECT: 0.4 mg/dL (ref 0.3–0.9)
Total Bilirubin: 0.5 mg/dL (ref 0.3–1.2)
Total Protein: 6.6 g/dL (ref 6.5–8.1)

## 2015-11-20 LAB — BASIC METABOLIC PANEL
ANION GAP: 6 (ref 5–15)
BUN: 11 mg/dL (ref 6–20)
CALCIUM: 8.8 mg/dL — AB (ref 8.9–10.3)
CO2: 26 mmol/L (ref 22–32)
CREATININE: 0.98 mg/dL (ref 0.61–1.24)
Chloride: 106 mmol/L (ref 101–111)
GLUCOSE: 98 mg/dL (ref 65–99)
Potassium: 3.7 mmol/L (ref 3.5–5.1)
Sodium: 138 mmol/L (ref 135–145)

## 2015-11-20 LAB — LACTIC ACID, PLASMA: Lactic Acid, Venous: 1 mmol/L (ref 0.5–1.9)

## 2015-11-20 LAB — LIPASE, BLOOD: Lipase: 31 U/L (ref 11–51)

## 2015-11-20 MED ORDER — AMOXICILLIN-POT CLAVULANATE 875-125 MG PO TABS
1.0000 | ORAL_TABLET | Freq: Once | ORAL | Status: AC
  Administered 2015-11-20: 1 via ORAL
  Filled 2015-11-20: qty 1

## 2015-11-20 MED ORDER — HYDROCODONE-ACETAMINOPHEN 5-325 MG PO TABS
1.0000 | ORAL_TABLET | Freq: Once | ORAL | Status: AC
  Administered 2015-11-20: 1 via ORAL
  Filled 2015-11-20: qty 1

## 2015-11-20 MED ORDER — AMOXICILLIN-POT CLAVULANATE 875-125 MG PO TABS: 1.0000 | ORAL_TABLET | Freq: Two times a day (BID) | ORAL | 0 refills | Status: AC

## 2015-11-20 NOTE — ED Provider Notes (Signed)
[facility] DEPT Provider Note   CSN: [id number] Arrival date & time: 11/20/15  1641     History   Chief Complaint Chief Complaint  Patient presents with  . Sinusitis    HPI Frank Moses is a 50 y.o. male With a past medical history significant for bipolar disorder, hypertension, stroke, and schizophrenia who presents with facial pain, headache, congestion, and rhinorrhea. Patient reports that the last three days, he has had severe facial pain with runny nose and congestion. Patient says that he has had chills but no documented fevers. He reports that he is also had some abdominal pain and low back pain. Patient reports no nausea, vomiting, constipation. He does report some chronic diarrhea.  Headache   This is a new problem. The current episode started more than 2 days ago. The problem occurs constantly. The problem has been gradually worsening. The headache is associated with nothing. The pain is located in the right unilateral region. The quality of the pain is described as throbbing. The pain is at a severity of 10/10. The pain is severe. Associated symptoms include a fever (subjective). Pertinent negatives include no chest pressure, no near-syncope, no palpitations, no shortness of breath, no nausea and no vomiting. He has tried nothing for the symptoms. The treatment provided no relief.    Past Medical History:  Diagnosis Date  . Arthritis   . Asthma   . Bipolar 1 disorder ([place])   . Depression   . Hypertension   . Peptic ulcer   . Schizophrenia ([place])   . Stroke [facility])     Patient Active Problem List   Diagnosis Date Noted  . PTSD (post-traumatic stress disorder) 01/09/2015  . Hyperlipidemia 01/09/2015  . MDD (major depressive disorder), recurrent, severe, with psychosis ([place]) 01/08/2015  . Alcohol use disorder, severe, dependence ([place]) 01/08/2015  . HTN (hypertension) 02/13/2014    Past Surgical History:  Procedure Laterality Date  . bil foot surgery    .  CERVICAL FUSION         Home Medications    Prior to Admission medications   Medication Sig Start Date End Date Taking? Authorizing Provider  hydrochlorothiazide (HYDRODIURIL) 25 MG tablet Take 1 tablet (25 mg total) by mouth daily. For high blood pressure 01/12/15   Encarnacion Slates, NP  lisinopril (PRINIVIL,ZESTRIL) 5 MG tablet Take 1 tablet (5 mg total) by mouth daily. For high blood pressure 01/12/15   Encarnacion Slates, NP  nicotine (NICODERM CQ - DOSED IN MG/24 HOURS) 21 mg/24hr patch Place 1 patch (21 mg total) onto the skin daily at 6 (six) AM. For smoking cessation Patient not taking: Reported on 06/13/2015 01/12/15   Encarnacion Slates, NP  pantoprazole (PROTONIX) 40 MG tablet Take 1 tablet (40 mg total) by mouth daily. For acid reflux 01/12/15   Encarnacion Slates, NP  QUEtiapine (SEROQUEL) 400 MG tablet Take 1 tablet (400 mg total) by mouth at bedtime. For mood control 01/12/15   Encarnacion Slates, NP  simvastatin (ZOCOR) 20 MG tablet Take 1 tablet (20 mg total) by mouth daily at 6 PM. For high cholesterol 01/12/15   Encarnacion Slates, NP  traZODone (DESYREL) 50 MG tablet Take 1 tablet (50 mg total) by mouth at bedtime as needed for sleep. 01/12/15   Encarnacion Slates, NP  venlafaxine XR (EFFEXOR-XR) 75 MG 24 hr capsule Take 3 capsules (225 mg total) by mouth daily. For depression 01/12/15   Encarnacion Slates, NP    Family  History Family History  Problem Relation Age of Onset  . Alcoholism Father     Social History Social History  Substance Use Topics  . Smoking status: Current Some Day Smoker    Packs/day: 1.00    Types: Cigarettes  . Smokeless tobacco: Never Used  . Alcohol use 14.4 oz/week    24 Cans of beer per week     Allergies   Shrimp [shellfish allergy]; Orange fruit [citrus]; and Tomato   Review of Systems Review of Systems  Constitutional: Positive for chills and fever (subjective). Negative for activity change, diaphoresis and fatigue.  HENT: Positive for rhinorrhea and sinus  pressure. Negative for trouble swallowing and voice change.   Eyes: Positive for pain. Negative for photophobia and visual disturbance.  Respiratory: Negative for cough, chest tightness, shortness of breath, wheezing and stridor.   Cardiovascular: Negative for chest pain, palpitations, leg swelling and near-syncope.  Gastrointestinal: Negative for abdominal distention, abdominal pain, blood in stool, constipation, diarrhea, nausea and vomiting.  Genitourinary: Negative for decreased urine volume, difficulty urinating, dysuria, flank pain and frequency.  Musculoskeletal: Negative for back pain, gait problem, neck pain and neck stiffness.  Skin: Negative for rash and wound.  Neurological: Positive for headaches. Negative for dizziness, seizures, weakness, light-headedness and numbness.  Psychiatric/Behavioral: Negative for agitation.  All other systems reviewed and are negative.    Physical Exam Updated Vital Signs BP 163/93 (BP Location: Left Arm)   Pulse 63   Temp 98.2 F (36.8 C) (Oral)   Resp 14   Ht 6\' 3"  (1.905 m)   Wt 200 lb (90.7 kg)   SpO2 99%   BMI 25.00 kg/m   Physical Exam  Constitutional: He appears well-developed and well-nourished.  HENT:  Head: Atraumatic. Head is without contusion.    Right Ear: External ear normal.  Left Ear: External ear normal.  Nose: Rhinorrhea and sinus tenderness present.  Mouth/Throat: Oropharynx is clear and moist. Normal dentition. No uvula swelling.  Eyes: Conjunctivae are normal.  Neck: Neck supple.  Cardiovascular: Normal rate and regular rhythm.   No murmur heard. Pulmonary/Chest: Effort normal and breath sounds normal. No respiratory distress.  Abdominal: Soft. There is no tenderness.  Musculoskeletal: He exhibits no edema.  Neurological: He is alert.  Skin: Skin is warm and dry.  Psychiatric: He has a normal mood and affect.  Nursing note and vitals reviewed.    ED Treatments / Results  Labs (all labs ordered are  listed, but only abnormal results are displayed) Labs Reviewed  BASIC METABOLIC PANEL - Abnormal; Notable for the following:       Result Value   Calcium 8.8 (*)    All other components within normal limits  CBC WITH DIFFERENTIAL/PLATELET  HEPATIC FUNCTION PANEL  LACTIC ACID, PLASMA  LIPASE, BLOOD  URINALYSIS, ROUTINE W REFLEX MICROSCOPIC (NOT AT [facility])    EKG  EKG Interpretation None       Radiology Dg Chest 2 View  Result Date: 11/20/2015 CLINICAL DATA:  Pain and nose and face.  Headache . EXAM: CHEST  2 VIEW COMPARISON:  1/5/6 FINDINGS: Normal mediastinum and cardiac silhouette. Normal pulmonary vasculature. No evidence of effusion, infiltrate, or pneumothorax. No acute bony abnormality. Anterior cervical fusion. IMPRESSION: No acute cardiopulmonary process. Electronically Signed   By: Suzy Bouchard M.D.   On: 11/20/2015 17:10   Ct Maxillofacial Wo Contrast  Result Date: 11/20/2015 CLINICAL DATA:  Sinusitis EXAM: CT MAXILLOFACIAL WITHOUT CONTRAST TECHNIQUE: Multidetector CT imaging of the maxillofacial structures was performed.  Multiplanar CT image reconstructions were also generated. A small metallic BB was placed on the right temple in order to reliably differentiate right from left. COMPARISON:  Head CT from 01/31/2014 FINDINGS: Osseous: No acute osseous abnormality or bone destruction. Nasal septal spur seen to the left of midline. Orbits: Symmetric in appearance. The globes are unremarkable. Suggestion of bilateral lens replacement surgeries. Sinuses: Moderate right greater than left ethmoid and mild right maxillary sinus mucosal thickening with trace air-fluid level seen along bold the dependent aspect of the right maxillary sinus. Minimal mucosal thickening in the sphenoid sinus along the right lateral as well as within the frontal sinus. The maxillary sinus appears clear. Soft tissues: Mucosal thickening along the ostiomeatal unit complexes bilaterally. Limited intracranial:  Unremarkable IMPRESSION: Chronic ethmoid sinusitis. Trace air-fluid level in the right maxillary sinus may represent acute brain maxillary sinusitis. Electronically Signed   By: Ashley Royalty M.D.   On: 11/20/2015 21:38  Will know Barnabas Lister  Procedures Procedures (including critical care time)  Medications Ordered in ED Medications  HYDROcodone-acetaminophen (NORCO/VICODIN) 5-325 MG per tablet 1 tablet (1 tablet Oral Given 11/20/15 2237)  amoxicillin-clavulanate (AUGMENTIN) 875-125 MG per tablet 1 tablet (1 tablet Oral Given 11/20/15 2237)     Initial Impression / Assessment and Plan / ED Course  I have reviewed the triage vital signs and the nursing notes.  Pertinent labs & imaging results that were available during my care of the patient were reviewed by me and considered in my medical decision making (see chart for details).  Clinical Course    Frank Moses is a 50 y.o. male With a past medical history significant for bipolar disorder, hypertension, stroke, and schizophrenia who presents with facial pain, headache, congestion, and rhinorrhea.  History and exam are see above.  Patient had severe facial tenderness in the right sinus area. Patient had rhinorrhea and congestion. He also had right Eye pain. Extraocular motions were intact and no blurred vision or diplopia. No trismus or decrease in range of motion of neck. Lungs clear and abdomen nontender.  Due to severity of patient's discomfort and location of symptoms, suspect sinusitis. Due to how painful this is, and the pain in his eye, decision made to obtain CT imaging to look for infection spread. CT showed sinusitis. X-ray obtained due to cough and no evidence of pneumonia.  Labs grossly unremarkable.  Patient given dose of Augmentin and given prescription of Augmentin for discharge. Patient will follow up with PCP and he was also given return precautions for worsened symptoms. Patient had no other questions or concerns and was  discharged in good condition.  Final Clinical Impressions(s) / ED Diagnoses   Final diagnoses:  Face pain  Acute non-recurrent sinusitis, unspecified location    New Prescriptions Discharge Medication List as of 11/20/2015 10:29 PM    START taking these medications   Details  amoxicillin-clavulanate (AUGMENTIN) 875-125 MG tablet Take 1 tablet by mouth every 12 (twelve) hours., Starting Tue 11/20/2015, Until Tue 11/27/2015, Print        Clinical Impression: 1. Acute non-recurrent sinusitis, unspecified location   2. Face pain     Disposition: Discharge  Condition: Good  I have discussed the results, Dx and Tx plan with the pt(& family if present). He/she/they expressed understanding and agree(s) with the plan. Discharge instructions discussed at great length. Strict return precautions discussed and pt &/or family have verbalized understanding of the instructions. No further questions at time of discharge.    Discharge Medication List  as of 11/20/2015 10:29 PM    START taking these medications   Details  amoxicillin-clavulanate (AUGMENTIN) 875-125 MG tablet Take 1 tablet by mouth every 12 (twelve) hours., Starting Tue 11/20/2015, Until Tue 11/27/2015, Print        Follow Up: [place] [phone] [place], MD 11/21/15 1350

## 2015-11-20 NOTE — ED Triage Notes (Signed)
Pt reports pain in his nose and face, headache and runny nose. Pt also c/o low back pain, has hx of same.

## 2015-11-29 DIAGNOSIS — F339 Major depressive disorder, recurrent, unspecified: Secondary | ICD-10-CM | POA: Insufficient documentation

## 2015-11-29 DIAGNOSIS — F17200 Nicotine dependence, unspecified, uncomplicated: Secondary | ICD-10-CM | POA: Insufficient documentation

## 2015-11-29 DIAGNOSIS — K219 Gastro-esophageal reflux disease without esophagitis: Secondary | ICD-10-CM | POA: Insufficient documentation

## 2015-11-30 ENCOUNTER — Encounter (INDEPENDENT_AMBULATORY_CARE_PROVIDER_SITE_OTHER): Payer: Self-pay

## 2015-11-30 ENCOUNTER — Encounter (INDEPENDENT_AMBULATORY_CARE_PROVIDER_SITE_OTHER): Payer: Self-pay | Admitting: *Deleted

## 2015-12-31 ENCOUNTER — Encounter (INDEPENDENT_AMBULATORY_CARE_PROVIDER_SITE_OTHER): Payer: Self-pay | Admitting: *Deleted

## 2016-01-01 ENCOUNTER — Other Ambulatory Visit (INDEPENDENT_AMBULATORY_CARE_PROVIDER_SITE_OTHER): Payer: Self-pay | Admitting: *Deleted

## 2016-01-01 DIAGNOSIS — Z1211 Encounter for screening for malignant neoplasm of colon: Secondary | ICD-10-CM

## 2016-02-29 ENCOUNTER — Telehealth (INDEPENDENT_AMBULATORY_CARE_PROVIDER_SITE_OTHER): Payer: Self-pay | Admitting: *Deleted

## 2016-02-29 ENCOUNTER — Encounter (INDEPENDENT_AMBULATORY_CARE_PROVIDER_SITE_OTHER): Payer: Self-pay | Admitting: *Deleted

## 2016-02-29 NOTE — Telephone Encounter (Signed)
Patient needs trilyte 

## 2016-03-03 MED ORDER — PEG 3350-KCL-NA BICARB-NACL 420 G PO SOLR: 4000.0000 mL | Freq: Once | ORAL | 0 refills | Status: AC

## 2016-03-10 ENCOUNTER — Telehealth (INDEPENDENT_AMBULATORY_CARE_PROVIDER_SITE_OTHER): Payer: Self-pay | Admitting: *Deleted

## 2016-03-10 NOTE — Telephone Encounter (Signed)
agree

## 2016-03-10 NOTE — Telephone Encounter (Signed)
Referring MD/PCP: nyland   Procedure: tcs  Reason/Indication:  screening  Has patient had this procedure before?  no  If so, when, by whom and where?    Is there a family history of colon cancer?  no  Who?  What age when diagnosed?    Is patient diabetic?   no      Does patient have prosthetic heart valve or mechanical valve?  no  Do you have a pacemaker?  no  Has patient ever had endocarditis? no  Has patient had joint replacement within last 12 months?  no  Does patient tend to be constipated or take laxatives? no  Does patient have a history of alcohol/drug use?  Yes, a beer occasstionally  Is patient on Coumadin, Plavix and/or Aspirin? no  Medications: hctz 25 mg daily, pantoprazole 40 mg daily, venlafaxine 75 mg tid  Allergies: nkda  Medication Adjustment:   Procedure date & time: 04/03/16 at 730

## 2016-05-26 ENCOUNTER — Emergency Department (HOSPITAL_COMMUNITY)
Admission: EM | Admit: 2016-05-26 | Discharge: 2016-05-26 | Disposition: A | Discharge status: Home / Self Care | Payer: Medicare HMO | Attending: Emergency Medicine | Admitting: Emergency Medicine

## 2016-05-26 ENCOUNTER — Encounter (HOSPITAL_COMMUNITY): Payer: Self-pay

## 2016-05-26 DIAGNOSIS — F17210 Nicotine dependence, cigarettes, uncomplicated: Secondary | ICD-10-CM | POA: Insufficient documentation

## 2016-05-26 DIAGNOSIS — Z79899 Other long term (current) drug therapy: Secondary | ICD-10-CM | POA: Diagnosis not present

## 2016-05-26 DIAGNOSIS — J45909 Unspecified asthma, uncomplicated: Secondary | ICD-10-CM | POA: Insufficient documentation

## 2016-05-26 DIAGNOSIS — I10 Essential (primary) hypertension: Secondary | ICD-10-CM | POA: Diagnosis not present

## 2016-05-26 DIAGNOSIS — M25572 Pain in left ankle and joints of left foot: Secondary | ICD-10-CM

## 2016-05-26 DIAGNOSIS — M10072 Idiopathic gout, left ankle and foot: Secondary | ICD-10-CM | POA: Diagnosis not present

## 2016-05-26 HISTORY — DX: Gout, unspecified: M10.9

## 2016-05-26 MED ORDER — OXYCODONE-ACETAMINOPHEN 5-325 MG PO TABS: 1.0000 | ORAL_TABLET | ORAL | 0 refills | Status: DC | PRN

## 2016-05-26 MED ORDER — OXYCODONE-ACETAMINOPHEN 5-325 MG PO TABS
1.0000 | ORAL_TABLET | Freq: Once | ORAL | Status: AC
  Administered 2016-05-26: 1 via ORAL
  Filled 2016-05-26: qty 1

## 2016-05-26 MED ORDER — PREDNISONE 20 MG PO TABS: 40.0000 mg | ORAL_TABLET | Freq: Every day | ORAL | 0 refills | Status: DC

## 2016-05-26 MED ORDER — PREDNISONE 50 MG PO TABS
60.0000 mg | ORAL_TABLET | Freq: Once | ORAL | Status: AC
  Administered 2016-05-26: 60 mg via ORAL
  Filled 2016-05-26: qty 1

## 2016-05-26 NOTE — ED Triage Notes (Signed)
Pt reports he has a gout flare up in left foot for the past 3 weeks. Has been using colchicine and allopurinol without relief

## 2016-05-26 NOTE — Discharge Instructions (Signed)
Start the prednisone tomorrow.  Be sure to follow-up with your primary provider regarding your high pressure and its very important that you take your blood pressure medication every day.

## 2016-05-26 NOTE — ED Provider Notes (Signed)
[place] DEPT Provider Note   CSN: [id number] Arrival date & time: 05/26/16  1607  By signing my name below, I, Collene Leyden, attest that this documentation has been prepared under the direction and in the presence of Ramzy Cappelletti PA-C. Electronically Signed: Collene Leyden, Scribe. 05/26/16. 6:20 PM.   History   Chief Complaint Chief Complaint  Patient presents with  . Foot Pain    HPI Comments: Frank Moses is a 51 y.o. male with a history of gout, who presents to the Emergency Department complaining of  constant left foot pain that began three weeks ago.  States pain feels similar to previous gout flares.  Patient reports gradually worsening pain over the past 3 weeks. Patient reports eating a "cube steak", which caused his flare up. Patient was seen at [facility] 3 weeks ago, in which he was prescribed colchicine and allopurinol.Patient states his pain never improved with medications. Patient reports associated swelling of the left foot. No relief with prescribed hydrocodone. Patient states his pain is worse with ambulation, palpation, and wearing shoes. Patient is ambulatory in the emergency department. Patient denies any chest pain, shortness of breath, headaches, numbness, weakness, nausea, vomiting, fever, or chills.   Patient states he did not take his blood pressure medications today, but denies associated sx's  The history is provided by the patient. No language interpreter was used.    Past Medical History:  Diagnosis Date  . Arthritis   . Asthma   . Bipolar 1 disorder ([place])   . Depression   . Gout   . Hypertension   . Peptic ulcer   . Schizophrenia ([place])   . Stroke [facility])     Patient Active Problem List   Diagnosis Date Noted  . Special screening for malignant neoplasms, colon 01/01/2016  . PTSD (post-traumatic stress disorder) 01/09/2015  . Hyperlipidemia 01/09/2015  . MDD (major depressive disorder), recurrent, severe, with psychosis ([place]) 01/08/2015  .  Alcohol use disorder, severe, dependence ([place]) 01/08/2015  . HTN (hypertension) 02/13/2014    Past Surgical History:  Procedure Laterality Date  . bil foot surgery    . CERVICAL FUSION         Home Medications    Prior to Admission medications   Medication Sig Start Date End Date Taking? Authorizing Provider  doxepin (SINEQUAN) 10 MG capsule Take 10 mg by mouth at bedtime.    Historical Provider, MD  hydrochlorothiazide (HYDRODIURIL) 25 MG tablet Take 1 tablet (25 mg total) by mouth daily. For high blood pressure 01/12/15   Encarnacion Slates, NP  pantoprazole (PROTONIX) 40 MG tablet Take 1 tablet (40 mg total) by mouth daily. For acid reflux 01/12/15   Encarnacion Slates, NP  venlafaxine XR (EFFEXOR-XR) 75 MG 24 hr capsule Take 3 capsules (225 mg total) by mouth daily. For depression 01/12/15   Encarnacion Slates, NP    Family History Family History  Problem Relation Age of Onset  . Alcoholism Father     Social History Social History  Substance Use Topics  . Smoking status: Current Some Day Smoker    Packs/day: 1.00    Types: Cigarettes  . Smokeless tobacco: Never Used  . Alcohol use Yes     Comment: every other day     Allergies   Other; Shrimp [shellfish allergy]; Orange fruit [citrus]; and Tomato   Review of Systems Review of Systems  Constitutional: Negative for chills and fever.  Respiratory: Negative for shortness of breath.   Cardiovascular: Negative for  chest pain.  Gastrointestinal: Negative for nausea and vomiting.  Musculoskeletal: Positive for arthralgias (left foot). Negative for gait problem.  Neurological: Negative for weakness, numbness and headaches.     Physical Exam Updated Vital Signs BP (!) 158/113 (BP Location: Right Arm) Comment: Pt reports he has not taken his BP pill today  Pulse 80   Temp 97 F (36.1 C) (Oral)   Resp 16   Wt 200 lb (90.7 kg)   SpO2 98%   BMI 25.00 kg/m   Physical Exam  Constitutional: He is oriented to person, place,  and time. He appears well-developed.  HENT:  Head: Normocephalic and atraumatic.  Mouth/Throat: Oropharynx is clear and moist.  Eyes: Conjunctivae and EOM are normal. Pupils are equal, round, and reactive to light.  Neck: Normal range of motion. Neck supple.  Cardiovascular: Normal rate, regular rhythm, normal heart sounds and intact distal pulses.   Pulmonary/Chest: Effort normal and breath sounds normal.  Abdominal: Soft. Bowel sounds are normal.  Musculoskeletal: Normal range of motion. He exhibits edema and tenderness. He exhibits no deformity.  Diffuse tenderness of the left ankle. Mild edema and erythema of the lateral left ankle. No excessive warmth.   Neurological: He is alert and oriented to person, place, and time. No sensory deficit.  Skin: Skin is warm and dry.  Psychiatric: He has a normal mood and affect.  Nursing note and vitals reviewed.    ED Treatments / Results  DIAGNOSTIC STUDIES: Oxygen Saturation is 98% on RA, normal by my interpretation.    COORDINATION OF CARE: 6:18 PM Discussed treatment plan with pt at bedside and pt agreed to plan, which includes a steroid shot.   Labs (all labs ordered are listed, but only abnormal results are displayed) Labs Reviewed - No data to display  EKG  EKG Interpretation None       Radiology No results found.  Procedures Procedures (including critical care time)  Medications Ordered in ED Medications - No data to display   Initial Impression / Assessment and Plan / ED Course  I have reviewed the triage vital signs and the nursing notes.  Pertinent labs & imaging results that were available during my care of the patient were reviewed by me and considered in my medical decision making (see chart for details).      Patient was seen at [facility] on 05/05/2016 for gout, which he was prescribed colchicine. Patient had a uric acid of 9 at visit.    7:16 PM Pt with history of hypertesion on re-check blood pressure  improved. Counseled on the importance of taking his blood pressure mediation daily. Appears stable for d/c.  Has f/u appt with PCP     Final Clinical Impressions(s) / ED Diagnoses   Final diagnoses:  Acute left ankle pain  Acute idiopathic gout of left ankle  Essential hypertension    New Prescriptions New Prescriptions   No medications on file   I personally performed the services described in this documentation, which was scribed in my presence. The recorded information has been reviewed and is accurate.     Kem Parkinson, PA-C 05/28/16 1322    Fredia Sorrow, MD 05/29/16 2053

## 2016-05-29 ENCOUNTER — Encounter (INDEPENDENT_AMBULATORY_CARE_PROVIDER_SITE_OTHER): Payer: Self-pay | Admitting: *Deleted

## 2016-06-11 ENCOUNTER — Emergency Department (HOSPITAL_COMMUNITY)
Admission: EM | Admit: 2016-06-11 | Discharge: 2016-06-11 | Disposition: A | Discharge status: Home / Self Care | Payer: Medicare HMO | Attending: Emergency Medicine | Admitting: Emergency Medicine

## 2016-06-11 ENCOUNTER — Emergency Department (HOSPITAL_COMMUNITY): Payer: Medicare HMO

## 2016-06-11 ENCOUNTER — Encounter (HOSPITAL_COMMUNITY): Payer: Self-pay | Admitting: *Deleted

## 2016-06-11 DIAGNOSIS — M25572 Pain in left ankle and joints of left foot: Secondary | ICD-10-CM | POA: Diagnosis not present

## 2016-06-11 DIAGNOSIS — J45909 Unspecified asthma, uncomplicated: Secondary | ICD-10-CM | POA: Diagnosis not present

## 2016-06-11 DIAGNOSIS — F17210 Nicotine dependence, cigarettes, uncomplicated: Secondary | ICD-10-CM | POA: Insufficient documentation

## 2016-06-11 DIAGNOSIS — I10 Essential (primary) hypertension: Secondary | ICD-10-CM | POA: Insufficient documentation

## 2016-06-11 DIAGNOSIS — G8929 Other chronic pain: Secondary | ICD-10-CM | POA: Diagnosis not present

## 2016-06-11 DIAGNOSIS — Z79899 Other long term (current) drug therapy: Secondary | ICD-10-CM | POA: Diagnosis not present

## 2016-06-11 MED ORDER — IBUPROFEN 800 MG PO TABS
800.0000 mg | ORAL_TABLET | Freq: Once | ORAL | Status: AC
  Administered 2016-06-11: 800 mg via ORAL
  Filled 2016-06-11: qty 1

## 2016-06-11 NOTE — ED Triage Notes (Signed)
Pt c/o left foot pain x one month, pt denies any injury

## 2016-06-11 NOTE — ED Notes (Signed)
Pt alert & oriented x4, stable gait. Patient given discharge instructions, paperwork & prescription(s). Patient  instructed to stop at the registration desk to finish any additional paperwork. Patient verbalized understanding. Pt left department w/ no further questions. 

## 2016-06-11 NOTE — ED Provider Notes (Signed)
[place] DEPT Provider Note   CSN: [id number] Arrival date & time: 06/11/16  0033     History   Chief Complaint Chief Complaint  Patient presents with  . Foot Pain    HPI Frank Moses is a 51 y.o. male.  The history is provided by the patient.  Foot Pain  This is a chronic problem. The current episode started more than 1 week ago. The problem occurs daily. The problem has been gradually worsening. The symptoms are aggravated by walking. The symptoms are relieved by rest.    Past Medical History:  Diagnosis Date  . Arthritis   . Asthma   . Bipolar 1 disorder ([place])   . Depression   . Gout   . Hypertension   . Peptic ulcer   . Schizophrenia ([place])   . Stroke [facility])     Patient Active Problem List   Diagnosis Date Noted  . Special screening for malignant neoplasms, colon 01/01/2016  . PTSD (post-traumatic stress disorder) 01/09/2015  . Hyperlipidemia 01/09/2015  . MDD (major depressive disorder), recurrent, severe, with psychosis ([place]) 01/08/2015  . Alcohol use disorder, severe, dependence ([place]) 01/08/2015  . HTN (hypertension) 02/13/2014    Past Surgical History:  Procedure Laterality Date  . bil foot surgery    . CERVICAL FUSION         Home Medications    Prior to Admission medications   Medication Sig Start Date End Date Taking? Authorizing Provider  doxepin (SINEQUAN) 10 MG capsule Take 10 mg by mouth at bedtime.    [provider]  hydrochlorothiazide (HYDRODIURIL) 25 MG tablet Take 1 tablet (25 mg total) by mouth daily. For high blood pressure 01/12/15   Nwoko, Herbert Pun I, NP  oxyCODONE-acetaminophen (PERCOCET/ROXICET) 5-325 MG tablet Take 1 tablet by mouth every 4 (four) hours as needed. 05/26/16   Triplett, Tammy, PA-C  pantoprazole (PROTONIX) 40 MG tablet Take 1 tablet (40 mg total) by mouth daily. For acid reflux 01/12/15   Lindell Spar I, NP  predniSONE (DELTASONE) 20 MG tablet Take 2 tablets (40 mg total) by mouth daily. For 4 days  05/26/16   Kem Parkinson, PA-C  venlafaxine XR (EFFEXOR-XR) 75 MG 24 hr capsule Take 3 capsules (225 mg total) by mouth daily. For depression 01/12/15   Encarnacion Slates, NP    Family History Family History  Problem Relation Age of Onset  . Alcoholism Father     Social History Social History  Substance Use Topics  . Smoking status: Current Some Day Smoker    Packs/day: 1.00    Types: Cigarettes  . Smokeless tobacco: Never Used  . Alcohol use Yes     Comment: every other day     Allergies   Other; Shrimp [shellfish allergy]; Orange fruit [citrus]; and Tomato   Review of Systems Review of Systems  Constitutional: Negative for fever.  Musculoskeletal: Positive for arthralgias and joint swelling.     Physical Exam Updated Vital Signs BP (!) 150/87 (BP Location: Right Arm)   Pulse 63   Temp 97.7 F (36.5 C) (Oral)   Resp 20   Ht 6\' 2"  (1.88 m)   Wt 92.1 kg   SpO2 100%   BMI 26.06 kg/m   Physical Exam CONSTITUTIONAL: pt sleeping on my arrival to room, no distress HEAD: Normocephalic/atraumatic ENMT: Mucous membranes moist NECK: supple no meningeal signs CV: S1/S2 noted, no murmurs/rubs/gallops noted LUNGS: Lungs are clear to auscultation bilaterally, no apparent distress ABDOMEN: soft, nontender NEURO: Pt is  awake/alert/appropriate, moves all extremitiesx4.  No facial droop.   EXTREMITIES: pulses normal/equal, full ROM Left ankle - mild diffuse tenderness. No erythema noted.  Mild swelling noted. There is no foot tenderness SKIN: warm, color normal PSYCH: no abnormalities of mood noted, alert and oriented to situation   ED Treatments / Results  Labs (all labs ordered are listed, but only abnormal results are displayed) Labs Reviewed - No data to display  EKG  EKG Interpretation None       Radiology Dg Ankle Complete Left  Result Date: 06/11/2016 CLINICAL DATA:  Left ankle pain for 1 month EXAM: LEFT ANKLE COMPLETE - 3+ VIEW COMPARISON:  None.  FINDINGS: There is internal hardware at the anterior aspect of the calcaneus. There is mild circumferential soft tissue swelling. No fracture or dislocation. No focal hardware abnormality. There is flattening of the plantar arch. IMPRESSION: No acute osseous or hardware abnormality. Electronically Signed   By: Ulyses Jarred M.D.   On: 06/11/2016 06:17    Procedures Procedures (including critical care time)  Medications Ordered in ED Medications  ibuprofen (ADVIL,MOTRIN) tablet 800 mg (800 mg Oral Given 06/11/16 0600)     Initial Impression / Assessment and Plan / ED Course  I have reviewed the triage vital signs and the nursing notes.  Pertinent labs & imaging results that were available during my care of the patient were reviewed by me and considered in my medical decision making (see chart for details).     Pt in the ED for continued left foot/ankle pain He has had previous surgery No h/o trauma He can ambulate No erythema noted Doubt infectious etiology Referred to orthopedist No further testing required at this time   Final Clinical Impressions(s) / ED Diagnoses   Final diagnoses:  Chronic pain of left ankle    New Prescriptions Discharge Medication List as of 06/11/2016  6:25 AM       Ripley Fraise, MD 06/11/16 [phone]

## 2016-06-16 ENCOUNTER — Telehealth (INDEPENDENT_AMBULATORY_CARE_PROVIDER_SITE_OTHER): Payer: Self-pay | Admitting: *Deleted

## 2016-06-16 NOTE — Telephone Encounter (Signed)
agree

## 2016-06-16 NOTE — Telephone Encounter (Signed)
Referring MD/PCP: nyland   Procedure: tcs  Reason/Indication:  screening  Has patient had this procedure before?  no             If so, when, by whom and where?    Is there a family history of colon cancer?  no             Who?  What age when diagnosed?    Is patient diabetic?   no                                                  Does patient have prosthetic heart valve or mechanical valve?  no  Do you have a pacemaker?  no  Has patient ever had endocarditis? no  Has patient had joint replacement within last 12 months?  no  Does patient tend to be constipated or take laxatives? no  Does patient have a history of alcohol/drug use?  Yes, a beer occasstionally  Is patient on Coumadin, Plavix and/or Aspirin? no  Medications: hctz 25 mg daily, pantoprazole 40 mg daily, venlafaxine 75 mg tid  Allergies: nkda  Medication Adjustment:   Procedure date & time: 07/16/16 at 830

## 2016-07-16 ENCOUNTER — Telehealth (INDEPENDENT_AMBULATORY_CARE_PROVIDER_SITE_OTHER): Payer: Self-pay | Admitting: Internal Medicine

## 2016-07-16 ENCOUNTER — Ambulatory Visit (HOSPITAL_COMMUNITY): Admission: RE | Admit: 2016-07-16 | Payer: Medicare HMO | Source: Ambulatory Visit | Admitting: Internal Medicine

## 2016-07-16 ENCOUNTER — Encounter (HOSPITAL_COMMUNITY): Admission: RE | Payer: Self-pay | Source: Ambulatory Visit

## 2016-07-16 SURGERY — COLONOSCOPY
Anesthesia: Moderate Sedation

## 2016-07-16 NOTE — Telephone Encounter (Signed)
Melanie in Endo called to let us know that this patient didn't show for his procedure.

## 2016-09-22 ENCOUNTER — Emergency Department (HOSPITAL_COMMUNITY)
Admission: EM | Admit: 2016-09-22 | Discharge: 2016-09-22 | Disposition: A | Discharge status: Home / Self Care | Payer: Medicare HMO | Attending: Emergency Medicine | Admitting: Emergency Medicine

## 2016-09-22 ENCOUNTER — Encounter (HOSPITAL_COMMUNITY): Payer: Self-pay | Admitting: Emergency Medicine

## 2016-09-22 DIAGNOSIS — F339 Major depressive disorder, recurrent, unspecified: Secondary | ICD-10-CM

## 2016-09-22 DIAGNOSIS — F329 Major depressive disorder, single episode, unspecified: Secondary | ICD-10-CM | POA: Diagnosis present

## 2016-09-22 DIAGNOSIS — J45909 Unspecified asthma, uncomplicated: Secondary | ICD-10-CM | POA: Diagnosis not present

## 2016-09-22 DIAGNOSIS — F17210 Nicotine dependence, cigarettes, uncomplicated: Secondary | ICD-10-CM | POA: Insufficient documentation

## 2016-09-22 DIAGNOSIS — Z79899 Other long term (current) drug therapy: Secondary | ICD-10-CM | POA: Diagnosis not present

## 2016-09-22 DIAGNOSIS — I10 Essential (primary) hypertension: Secondary | ICD-10-CM | POA: Diagnosis not present

## 2016-09-22 LAB — COMPREHENSIVE METABOLIC PANEL
ALBUMIN: 4.5 g/dL (ref 3.5–5.0)
ALT: 38 U/L (ref 17–63)
AST: 40 U/L (ref 15–41)
Alkaline Phosphatase: 106 U/L (ref 38–126)
Anion gap: 9 (ref 5–15)
BUN: 9 mg/dL (ref 6–20)
CALCIUM: 9.8 mg/dL (ref 8.9–10.3)
CHLORIDE: 105 mmol/L (ref 101–111)
CO2: 28 mmol/L (ref 22–32)
Creatinine, Ser: 0.99 mg/dL (ref 0.61–1.24)
GFR calc non Af Amer: >60 mL/min (ref >60)
GLUCOSE: 119 mg/dL — AB (ref 65–99)
Potassium: 3.9 mmol/L (ref 3.5–5.1)
SODIUM: 142 mmol/L (ref 135–145)
Total Bilirubin: 0.8 mg/dL (ref 0.3–1.2)
Total Protein: 7.7 g/dL (ref 6.5–8.1)

## 2016-09-22 LAB — RAPID URINE DRUG SCREEN, HOSP PERFORMED
Amphetamines: NOT DETECTED
BARBITURATES: NOT DETECTED
BENZODIAZEPINES: NOT DETECTED
COCAINE: NOT DETECTED
Opiates: NOT DETECTED
TETRAHYDROCANNABINOL: NOT DETECTED

## 2016-09-22 LAB — CBC WITH DIFFERENTIAL/PLATELET
BASOS ABS: 0 10*3/uL (ref 0.0–0.1)
BASOS PCT: 0 %
EOS PCT: 4 %
Eosinophils Absolute: 0.3 10*3/uL (ref 0.0–0.7)
HCT: 45.4 % (ref 39.0–52.0)
Hemoglobin: 16.5 g/dL (ref 13.0–17.0)
Lymphocytes Relative: 31 %
Lymphs Abs: 2.3 10*3/uL (ref 0.7–4.0)
MCH: 33.5 pg (ref 26.0–34.0)
MCHC: 36.7 g/dL — ABNORMAL HIGH (ref 30.0–36.0)
MCV: 91.2 fL (ref 78.0–100.0)
MONO ABS: 0.8 10*3/uL (ref 0.1–1.0)
Monocytes Relative: 11 %
NEUTROS ABS: 4.2 10*3/uL (ref 1.7–7.7)
Neutrophils Relative %: 54 %
PLATELETS: 223 10*3/uL (ref 150–400)
RBC: 4.98 MIL/uL (ref 4.22–5.81)
RDW: 12.9 % (ref 11.5–15.5)
WBC: 7.6 10*3/uL (ref 4.0–10.5)

## 2016-09-22 LAB — ETHANOL: Alcohol, Ethyl (B): <5 mg/dL (ref <5)

## 2016-09-22 NOTE — ED Triage Notes (Signed)
Patient comes in via [facility]. Patient was taken off effexor and given Xanax and patient states he has been feeling depressed. Pt wants psych evaluation. Patient advocates depression and states he has been seeing things. Patient denies SI.

## 2016-09-22 NOTE — ED Notes (Signed)
Pt requesting night time medication, specifically Effexor- Dr Sabra Heck notified-no new orders received at this time.

## 2016-09-22 NOTE — ED Notes (Signed)
TTS in progress 

## 2016-09-22 NOTE — Discharge Instructions (Signed)
You MUST go to [facility] in the morning to have them adjust your medicines including your Effexor if they think that you need to be back on that medicine.  Return to the ER for severe or worsening symptoms.

## 2016-09-22 NOTE — ED Provider Notes (Signed)
[place] DEPT Provider Note   CSN: [id number] Arrival date & time: 09/22/16  1836     History   Chief Complaint Chief Complaint  Patient presents with  . Psychiatric Evaluation    HPI Frank Moses is a 51 y.o. male.  HPI  The pt is a 51 y/o male Hx of Depression, bipolar disorder and Schizophrenia.  He presents to the hospital within one month of being taken off Effexor, he was taking 75 mg per day but this was switched with Xanax for his anxiety. He reports that over the last couple of days he has had increasing amounts of depression, he has not slept in 4 days during that he will die in his sleep, he is having ongoing visual hallucinations which she states are chronic, there is no auditory hallucinations, no command hallucinations and he denies any suicidal thoughts or behavior. He does not have any violent tendencies and does not want to hurt anybody else either. The patient states that he feels like he needs to be back on this medication, he does not know why he was taken off of this medication, he states that he is going into seclusion, he is not eating well, he is not sleeping, he is crying spontaneously. His symptoms seem to be gradually worsening and is now become severe.  Past Medical History:  Diagnosis Date  . Arthritis   . Asthma   . Bipolar 1 disorder ([place])   . Depression   . Gout   . Hypertension   . Peptic ulcer   . Schizophrenia ([place])   . Stroke [facility])     Patient Active Problem List   Diagnosis Date Noted  . Special screening for malignant neoplasms, colon 01/01/2016  . PTSD (post-traumatic stress disorder) 01/09/2015  . Hyperlipidemia 01/09/2015  . MDD (major depressive disorder), recurrent, severe, with psychosis ([place]) 01/08/2015  . Alcohol use disorder, severe, dependence ([place]) 01/08/2015  . HTN (hypertension) 02/13/2014    Past Surgical History:  Procedure Laterality Date  . bil foot surgery    . CERVICAL FUSION         Home  Medications    Prior to Admission medications   Medication Sig Start Date End Date Taking? Authorizing Provider  doxepin (SINEQUAN) 10 MG capsule Take 10 mg by mouth at bedtime.    [provider]  hydrochlorothiazide (HYDRODIURIL) 25 MG tablet Take 1 tablet (25 mg total) by mouth daily. For high blood pressure 01/12/15   Nwoko, Herbert Pun I, NP  oxyCODONE-acetaminophen (PERCOCET/ROXICET) 5-325 MG tablet Take 1 tablet by mouth every 4 (four) hours as needed. 05/26/16   Triplett, Tammy, PA-C  pantoprazole (PROTONIX) 40 MG tablet Take 1 tablet (40 mg total) by mouth daily. For acid reflux 01/12/15   Lindell Spar I, NP  predniSONE (DELTASONE) 20 MG tablet Take 2 tablets (40 mg total) by mouth daily. For 4 days 05/26/16   Kem Parkinson, PA-C  venlafaxine XR (EFFEXOR-XR) 75 MG 24 hr capsule Take 3 capsules (225 mg total) by mouth daily. For depression 01/12/15   Encarnacion Slates, NP    Family History Family History  Problem Relation Age of Onset  . Alcoholism Father     Social History Social History  Substance Use Topics  . Smoking status: Current Some Day Smoker    Packs/day: 1.00    Types: Cigarettes  . Smokeless tobacco: Never Used  . Alcohol use Yes     Comment: every other day     Allergies  Other; Shrimp [shellfish allergy]; Orange fruit [citrus]; and Tomato   Review of Systems Review of Systems  All other systems reviewed and are negative.    Physical Exam Updated Vital Signs BP (!) 158/90 (BP Location: Right Arm)   Pulse 66   Temp 98.5 F (36.9 C) (Oral)   Resp 16   Ht 6\' 2"  (1.88 m)   Wt 90.7 kg (200 lb)   SpO2 97%   BMI 25.68 kg/m   Physical Exam  Constitutional: He appears well-developed and well-nourished. No distress.  HENT:  Head: Normocephalic and atraumatic.  Mouth/Throat: Oropharynx is clear and moist. No oropharyngeal exudate.  Eyes: Pupils are equal, round, and reactive to light. Conjunctivae and EOM are normal. Right eye exhibits no  discharge. Left eye exhibits no discharge. No scleral icterus.  Neck: Normal range of motion. Neck supple. No JVD present. No thyromegaly present.  Cardiovascular: Normal rate, regular rhythm, normal heart sounds and intact distal pulses.  Exam reveals no gallop and no friction rub.   No murmur heard. Pulmonary/Chest: Effort normal and breath sounds normal. No respiratory distress. He has no wheezes. He has no rales.  Abdominal: Soft. Bowel sounds are normal. He exhibits no distension and no mass. There is no tenderness.  Musculoskeletal: Normal range of motion. He exhibits no edema or tenderness.  Lymphadenopathy:    He has no cervical adenopathy.  Neurological: He is alert. Coordination normal.  Skin: Skin is warm and dry. No rash noted. No erythema.  Psychiatric:  Depressed Affect Not actively responding to internal stimuli  Nursing note and vitals reviewed.    ED Treatments / Results  Labs (all labs ordered are listed, but only abnormal results are displayed) Labs Reviewed - No data to display   Radiology No results found.  Procedures Procedures (including critical care time)  Medications Ordered in ED Medications - No data to display   Initial Impression / Assessment and Plan / ED Course  I have reviewed the triage vital signs and the nursing notes.  Pertinent labs & imaging results that were available during my care of the patient were reviewed by me and considered in my medical decision making (see chart for details).     The pt appears mildly depressed He denies substance abuse Last drink was 7 days ago - states not a heavy drinker Exam c/w worsening depression / also not sleeping and ongoing mild hallucinations Consultation with psychiatry.  Care discussed with Beverely Low of TTS - he agrees that pt does not need admission and can f/u with Psych at [facility] tomorrow. Pt informed and in agreement - he has no suicidal thoughts or homicidal /violent thoughts.  Final  Clinical Impressions(s) / ED Diagnoses   Final diagnoses:  Episode of recurrent major depressive disorder, unspecified depression episode severity ([place])    New Prescriptions New Prescriptions   No medications on file     Noemi Chapel, MD 09/22/16 2334

## 2016-09-22 NOTE — BH Assessment (Addendum)
Tele Assessment Note   Patient Name: Frank Moses MRN: [id number] Referring Physician: Dr. Noemi Chapel Location of Patient: [facility] Location of Provider: [place] is an 51 y.o. male.   -Clinician reviewed note by Dr. Sabra Heck.  Hx of Depression, bipolar disorder and Schizophrenia.  He presents to the hospital within one month of being taken off Effexor, he was taking 75 mg per day but this was switched with Xanax for his anxiety. He reports that over the last couple of days he has had increasing amounts of depression, he has not slept in 4 days during that he will die in his sleep, he is having ongoing visual hallucinations which she states are chronic, there is no auditory hallucinations, no command hallucinations and he denies any suicidal thoughts or behavior. He does not have any violent tendencies and does not want to hurt anybody else either. The patient states that he feels like he needs to be back on this medication, he does not know why he was taken off of this medication, he states that he is going into seclusion, he is not eating well, he is not sleeping, he is crying spontaneously. His symptoms seem to be gradually worsening and is now become severe.  Patient says that he has been without sleep for about 4 days.  No appetite for the last few days and has been crying over nothing.  Patient reports increased depression and some increased anxiety.  Patient does not have any suicidal thoughts, plans or intention.  He has had two previous suicide attempts.  Patient denies any HI.  He sees things out of the corner of his eye but this is attributable to lack of sleep.  Patient has been out of his Effexor since 09/15/16.  Dr. Hoyle Barr at [facility] had not authorized any refills.  Patient says that the pharmacy could not refill it until he authorized it.  Patient also says that he has called [facility] in [place] for the last week and they do not call him back or give  him any clear instruction.  Pt says that Dr. Hoyle Barr had started him on xanax but he does not want to take it because he does not want to get addicted to it.  He has only taken one dose.    Patient wants to get his medication problem addressed.  He does not want to come in for inpatient care.  Clinician inquired about inpatient care and pt has been to [facility] and to [facility] in the past.  He has been going to [facility] for years.  Has only had Dr. Hoyle Barr for the last two years.  -Clinician discussed patient care with Patriciaann Clan, PA who said patient does not meet inpatient care criteria at this time.  Patient could get an AM psych eval or go to [facility] as a walk in and request help in person.  Clinician discussed with Dr. Sabra Heck and he was in agreement about no inpatient being needed.  Patient will be recommended to go to [facility] in person.   Diagnosis: Bipolar 1 d/o  Past Medical History:  Past Medical History:  Diagnosis Date  . Arthritis   . Asthma   . Bipolar 1 disorder ([place])   . Depression   . Gout   . Hypertension   . Peptic ulcer   . Schizophrenia ([place])   . Stroke [facility])     Past Surgical History:  Procedure Laterality Date  . bil foot surgery    .  CERVICAL FUSION      Family History:  Family History  Problem Relation Age of Onset  . Alcoholism Father     Social History:  reports that he has been smoking Cigarettes.  He has been smoking about 1.00 pack per day. He has never used smokeless tobacco. He reports that he drinks alcohol. He reports that he does not use drugs.  Additional Social History:  Alcohol / Drug Use Pain Medications: None Prescriptions: Two BP meds; a gout medication; GERD medication prescribed by PCP.  A sleep medication and the Effexor prescribed by Dr. Hoyle Barr at [facility]. Over the Counter: ASA as needed. History of alcohol / drug use?: No history of alcohol / drug abuse  CIWA: CIWA-Ar BP: (!) 158/90 Pulse Rate: 66 COWS:    PATIENT STRENGTHS: (choose at least  two) Ability for insight Average or above average intelligence Capable of independent living Communication skills Motivation for treatment/growth Supportive family/friends  Allergies:  Allergies  Allergen Reactions  . Other     Steak - triggers gout flares   . Shrimp [Shellfish Allergy] Other (See Comments)    Triggers gout flare  . Orange Fruit [Citrus]     Boil-like spots on skin  . Tomato Rash and Other (See Comments)    REACTION: Boil-like spots on skin    Home Medications:  (Not in a hospital admission)  OB/GYN Status:  No LMP for male patient.  General Assessment Data Location of Assessment: [facility] TTS Assessment: In system Is this a Tele or Face-to-Face Assessment?: Tele Assessment Is this an Initial Assessment or a Re-assessment for this encounter?: Initial Assessment Marital status: Single Is patient pregnant?: No Pregnancy Status: No Living Arrangements: Alone Can pt return to current living arrangement?: Yes Admission Status: Voluntary Is patient capable of signing voluntary admission?: Yes Referral Source: Self/Family/Friend (Pt called [facility].) Insurance type: [facility]     Crisis Care Plan Living Arrangements: Alone Name of Psychiatrist: [facility]oyle [facility] Name of Therapist: None  Education Status Is patient currently in school?: No Highest grade of school patient has completed: 12th grade  Risk to self with the past 6 months Suicidal Ideation: No Has patient been a risk to self within the past 6 months prior to admission? : No Suicidal Intent: No Has patient had any suicidal intent within the past 6 months prior to admission? : No Is patient at risk for suicide?: No Suicidal Plan?: No Has patient had any suicidal plan within the past 6 months prior to admission? : No Access to Means: No What has been your use of drugs/alcohol within the last 12 months?: Occasional ETOH use Previous Attempts/Gestures: Yes How many times?: 2 Other  Self Harm Risks: None Triggers for Past Attempts: None known Intentional Self Injurious Behavior: None Family Suicide History: No Recent stressful life event(s): Recent negative physical changes (Medication changes ) Persecutory voices/beliefs?: No Depression: Yes Depression Symptoms: Despondent, Loss of interest in usual pleasures, Insomnia, Isolating, Feeling worthless/self pity, Tearfulness Substance abuse history and/or treatment for substance abuse?: No Suicide prevention information given to non-admitted patients: Not applicable  Risk to Others within the past 6 months Homicidal Ideation: No Does patient have any lifetime risk of violence toward others beyond the six months prior to admission? : No Thoughts of Harm to Others: No Current Homicidal Intent: No Current Homicidal Plan: No Access to Homicidal Means: No Identified Victim: No one History of harm to others?: Yes Assessment of Violence: In distant past Violent Behavior Description:  Early 20's Does patient have access to weapons?: No Criminal Charges Pending?: No Does patient have a court date: No Is patient on probation?: No  Psychosis Hallucinations: None noted Delusions: Unspecified (Scared he might not wake up if he goes to sleep.)  Mental Status Report Appearance/Hygiene: Unremarkable, In scrubs Eye Contact: Good Motor Activity: Freedom of movement Speech: Logical/coherent Level of Consciousness: Alert Mood: Depressed, Sad, Anxious Affect: Anxious, Depressed Anxiety Level: Moderate Thought Processes: Coherent, Relevant Judgement: Unimpaired Orientation: Person, Place, Situation, Time Obsessive Compulsive Thoughts/Behaviors: None  Cognitive Functioning Concentration: Poor Memory: Recent Impaired, Remote Impaired IQ: Average Insight: Good Impulse Control: Fair Appetite: Poor Weight Loss:  ("Don't want nothing to eat & don't drink anything.") Weight Gain: 0 Sleep: Decreased Total Hours of Sleep:   (No sleep in the last four days.) Vegetative Symptoms: Staying in bed  ADLScreening [facility] Services) Patient's cognitive ability adequate to safely complete daily activities?: Yes Patient able to express need for assistance with ADLs?: Yes Independently performs ADLs?: Yes (appropriate for developmental age)  Prior Inpatient Therapy Prior Inpatient Therapy: Yes Prior Therapy Dates: [facility], [facility] Prior Therapy Facilty/Provider(s): Can't remember dates Reason for Treatment: SI  Prior Outpatient Therapy Prior Outpatient Therapy: Yes Prior Therapy Dates: "Long long time" Prior Therapy Facilty/Provider(s): [facility] in [place] Reason for Treatment: med management Does patient have an ACCT team?: No Does patient have Intensive In-House Services?  : No Does patient have [facility] services? : No Does patient have P4CC services?: No  ADL Screening (condition at time of admission) Patient's cognitive ability adequate to safely complete daily activities?: Yes Is the patient deaf or have difficulty hearing?: No Does the patient have difficulty seeing, even when wearing glasses/contacts?: Yes ("Things get blurry at times.") Does the patient have difficulty concentrating, remembering, or making decisions?: No Patient able to express need for assistance with ADLs?: Yes Does the patient have difficulty dressing or bathing?: No Independently performs ADLs?: Yes (appropriate for developmental age) Does the patient have difficulty walking or climbing stairs?: Yes (Bad knees;) Weakness of Legs: Both Weakness of Arms/Hands: None       Abuse/Neglect Assessment (Assessment to be complete while patient is alone) Physical Abuse: Denies Verbal Abuse: Denies Sexual Abuse: Denies Exploitation of patient/patient's resources: Denies Self-Neglect: Denies     Regulatory affairs officer (For Healthcare) Does Patient Have a Medical Advance Directive?: No Would patient like information on creating a medical  advance directive?: No - Patient declined    Additional Information 1:1 In Past 12 Months?: No CIRT Risk: No Elopement Risk: No Does patient have medical clearance?: Yes     Disposition:  Disposition Initial Assessment Completed for this Encounter: Yes Disposition of Patient: Other dispositions Other disposition(s): Other (Comment) (Review with PA)  This service was provided via telemedicine using a 2-way, interactive audio and video technology.  Names of all persons participating in this telemedicine service and their role in this encounter. Name:  Role:   Name:  Role:   Name:  Role:   Name:  Role:     Frank Moses 09/22/2016 8:58 PM

## 2016-09-22 NOTE — ED Notes (Addendum)
Pt given back his medications, clothing, and cell phone. Dr Sabra Heck at bedside at this time discussing discharge and f/u with pt.

## 2016-09-23 NOTE — ED Notes (Signed)
Pt called ER and says he saw Dr. Hoyle Barr and is upset because he took him off of his effexor and put him on another medication.  Pt did not say

## 2016-09-23 NOTE — ED Notes (Signed)
Pt called upset because Dr. Hoyle Barr at [facility] changed his medication.  Pt says he was on effexor 250mg  and they stopped it and put him on 20mg  of another medication.  Pt says he plans to take 10 1/2 pills at a time until they are gone, states, "It will either help me or kill me and everyone will know what kind of doctor he his."  Pt used several cuss words during the conversation.   Pt says he doesn't know what the name of the new medication is but says he isn't going to start at 20mg .   Informed pt that he should take the medication as prescribed, he refused.    Instructed pt to call [facility] but he refused.    Called [facility] and spoke with Sharlet Salina, center director and another staff member and was told they were very familiar with pt and they had already talked with him in great length this morning.  Was told they  would contact pt and the ER did not need to do anything further.  Emergency contact phone number also given to [place] pt did not answer phone.  Notified Dr. Jeanell Sparrow and was instructed to make sure there was a plan in place in case [facility] could not get back in touch with pt. Called back to [facility] and spoke with Sonia Baller RN who said if they can't get pt to answer the phone they will send mobile crisis out to pt's residence.

## 2016-09-25 ENCOUNTER — Emergency Department (HOSPITAL_COMMUNITY)
Admission: EM | Admit: 2016-09-25 | Discharge: 2016-09-25 | Disposition: A | Discharge status: Other Acute Inpatient Hospital | Payer: Medicare HMO | Attending: Emergency Medicine | Admitting: Emergency Medicine

## 2016-09-25 ENCOUNTER — Inpatient Hospital Stay (HOSPITAL_COMMUNITY)
Admission: AD | Admit: 2016-09-25 | Discharge: 2016-09-29 | DRG: 885 | Disposition: A | Discharge status: Home / Self Care | Payer: Medicare HMO | Source: Other Acute Inpatient Hospital | Attending: Psychiatry | Admitting: Psychiatry

## 2016-09-25 ENCOUNTER — Encounter (HOSPITAL_COMMUNITY): Payer: Self-pay | Admitting: *Deleted

## 2016-09-25 ENCOUNTER — Encounter (HOSPITAL_COMMUNITY): Payer: Self-pay | Admitting: Emergency Medicine

## 2016-09-25 DIAGNOSIS — R45851 Suicidal ideations: Secondary | ICD-10-CM | POA: Diagnosis not present

## 2016-09-25 DIAGNOSIS — Z8711 Personal history of peptic ulcer disease: Secondary | ICD-10-CM | POA: Diagnosis not present

## 2016-09-25 DIAGNOSIS — F99 Mental disorder, not otherwise specified: Secondary | ICD-10-CM | POA: Insufficient documentation

## 2016-09-25 DIAGNOSIS — R441 Visual hallucinations: Secondary | ICD-10-CM

## 2016-09-25 DIAGNOSIS — Z91013 Allergy to seafood: Secondary | ICD-10-CM

## 2016-09-25 DIAGNOSIS — Z91018 Allergy to other foods: Secondary | ICD-10-CM

## 2016-09-25 DIAGNOSIS — F17210 Nicotine dependence, cigarettes, uncomplicated: Secondary | ICD-10-CM | POA: Insufficient documentation

## 2016-09-25 DIAGNOSIS — F1020 Alcohol dependence, uncomplicated: Secondary | ICD-10-CM | POA: Diagnosis not present

## 2016-09-25 DIAGNOSIS — F209 Schizophrenia, unspecified: Secondary | ICD-10-CM

## 2016-09-25 DIAGNOSIS — F29 Unspecified psychosis not due to a substance or known physiological condition: Secondary | ICD-10-CM | POA: Diagnosis not present

## 2016-09-25 DIAGNOSIS — K219 Gastro-esophageal reflux disease without esophagitis: Secondary | ICD-10-CM | POA: Diagnosis present

## 2016-09-25 DIAGNOSIS — J45909 Unspecified asthma, uncomplicated: Secondary | ICD-10-CM | POA: Insufficient documentation

## 2016-09-25 DIAGNOSIS — F4310 Post-traumatic stress disorder, unspecified: Secondary | ICD-10-CM | POA: Diagnosis present

## 2016-09-25 DIAGNOSIS — E785 Hyperlipidemia, unspecified: Secondary | ICD-10-CM | POA: Diagnosis present

## 2016-09-25 DIAGNOSIS — M109 Gout, unspecified: Secondary | ICD-10-CM | POA: Diagnosis present

## 2016-09-25 DIAGNOSIS — Z8673 Personal history of transient ischemic attack (TIA), and cerebral infarction without residual deficits: Secondary | ICD-10-CM | POA: Diagnosis not present

## 2016-09-25 DIAGNOSIS — Z79899 Other long term (current) drug therapy: Secondary | ICD-10-CM | POA: Insufficient documentation

## 2016-09-25 DIAGNOSIS — F251 Schizoaffective disorder, depressive type: Secondary | ICD-10-CM | POA: Diagnosis present

## 2016-09-25 DIAGNOSIS — I10 Essential (primary) hypertension: Secondary | ICD-10-CM | POA: Diagnosis present

## 2016-09-25 DIAGNOSIS — Z046 Encounter for general psychiatric examination, requested by authority: Secondary | ICD-10-CM | POA: Insufficient documentation

## 2016-09-25 DIAGNOSIS — R451 Restlessness and agitation: Secondary | ICD-10-CM | POA: Diagnosis not present

## 2016-09-25 DIAGNOSIS — Z811 Family history of alcohol abuse and dependence: Secondary | ICD-10-CM | POA: Diagnosis not present

## 2016-09-25 DIAGNOSIS — G4700 Insomnia, unspecified: Secondary | ICD-10-CM | POA: Diagnosis present

## 2016-09-25 DIAGNOSIS — Z9114 Patient's other noncompliance with medication regimen: Secondary | ICD-10-CM | POA: Insufficient documentation

## 2016-09-25 DIAGNOSIS — R443 Hallucinations, unspecified: Secondary | ICD-10-CM | POA: Diagnosis present

## 2016-09-25 DIAGNOSIS — R45850 Homicidal ideations: Secondary | ICD-10-CM | POA: Diagnosis not present

## 2016-09-25 DIAGNOSIS — R454 Irritability and anger: Secondary | ICD-10-CM | POA: Diagnosis not present

## 2016-09-25 LAB — SALICYLATE LEVEL: Salicylate Lvl: <7 mg/dL (ref 2.8–30.0)

## 2016-09-25 LAB — CBC WITH DIFFERENTIAL/PLATELET
BASOS ABS: 0 10*3/uL (ref 0.0–0.1)
BASOS PCT: 1 %
Eosinophils Absolute: 0.2 10*3/uL (ref 0.0–0.7)
Eosinophils Relative: 3 %
HEMATOCRIT: 42.4 % (ref 39.0–52.0)
HEMOGLOBIN: 15.2 g/dL (ref 13.0–17.0)
LYMPHS PCT: 20 %
Lymphs Abs: 1.5 10*3/uL (ref 0.7–4.0)
MCH: 32.8 pg (ref 26.0–34.0)
MCHC: 35.8 g/dL (ref 30.0–36.0)
MCV: 91.6 fL (ref 78.0–100.0)
MONO ABS: 0.5 10*3/uL (ref 0.1–1.0)
Monocytes Relative: 6 %
NEUTROS ABS: 5.3 10*3/uL (ref 1.7–7.7)
NEUTROS PCT: 70 %
Platelets: UNDETERMINED 10*3/uL (ref 150–400)
RBC: 4.63 MIL/uL (ref 4.22–5.81)
RDW: 13 % (ref 11.5–15.5)
WBC: 7.6 10*3/uL (ref 4.0–10.5)

## 2016-09-25 LAB — COMPREHENSIVE METABOLIC PANEL
ALBUMIN: 4.5 g/dL (ref 3.5–5.0)
ALK PHOS: 88 U/L (ref 38–126)
ALT: 51 U/L (ref 17–63)
AST: 61 U/L — AB (ref 15–41)
Anion gap: 11 (ref 5–15)
BILIRUBIN TOTAL: 0.8 mg/dL (ref 0.3–1.2)
BUN: 10 mg/dL (ref 6–20)
CALCIUM: 9.4 mg/dL (ref 8.9–10.3)
CO2: 23 mmol/L (ref 22–32)
Chloride: 106 mmol/L (ref 101–111)
Creatinine, Ser: 0.87 mg/dL (ref 0.61–1.24)
GFR calc Af Amer: >60 mL/min (ref >60)
GLUCOSE: 154 mg/dL — AB (ref 65–99)
Potassium: 3.4 mmol/L — ABNORMAL LOW (ref 3.5–5.1)
Sodium: 140 mmol/L (ref 135–145)
Total Protein: 7.9 g/dL (ref 6.5–8.1)

## 2016-09-25 LAB — RAPID URINE DRUG SCREEN, HOSP PERFORMED
AMPHETAMINES: NOT DETECTED
BARBITURATES: NOT DETECTED
BENZODIAZEPINES: NOT DETECTED
COCAINE: NOT DETECTED
Opiates: NOT DETECTED
Tetrahydrocannabinol: NOT DETECTED

## 2016-09-25 LAB — ETHANOL

## 2016-09-25 LAB — ACETAMINOPHEN LEVEL

## 2016-09-25 MED ORDER — ALUM & MAG HYDROXIDE-SIMETH 200-200-20 MG/5ML PO SUSP
30.0000 mL | ORAL | Status: DC | PRN
Start: 2016-09-25 — End: 2016-09-29

## 2016-09-25 MED ORDER — ALLOPURINOL 300 MG PO TABS
300.0000 mg | ORAL_TABLET | Freq: Every day | ORAL | Status: DC
  Filled 2016-09-25 (×2): qty 1

## 2016-09-25 MED ORDER — PANTOPRAZOLE SODIUM 40 MG PO TBEC
40.0000 mg | DELAYED_RELEASE_TABLET | Freq: Every day | ORAL | Status: DC
  Administered 2016-09-26 – 2016-09-29 (×4): 40 mg via ORAL
  Filled 2016-09-25 (×6): qty 1

## 2016-09-25 MED ORDER — LORAZEPAM 1 MG PO TABS
1.0000 mg | ORAL_TABLET | ORAL | Status: DC | PRN
  Filled 2016-09-25: qty 1

## 2016-09-25 MED ORDER — RISPERIDONE 1 MG PO TBDP
2.0000 mg | ORAL_TABLET | Freq: Three times a day (TID) | ORAL | Status: DC | PRN
  Filled 2016-09-25: qty 2

## 2016-09-25 MED ORDER — CITALOPRAM HYDROBROMIDE 20 MG PO TABS
20.0000 mg | ORAL_TABLET | Freq: Every day | ORAL | Status: DC
  Filled 2016-09-25 (×2): qty 1

## 2016-09-25 MED ORDER — ATORVASTATIN CALCIUM 10 MG PO TABS
10.0000 mg | ORAL_TABLET | Freq: Every day | ORAL | Status: DC
Start: 2016-09-25 — End: 2016-09-25
  Filled 2016-09-25 (×2): qty 1

## 2016-09-25 MED ORDER — ZIPRASIDONE MESYLATE 20 MG IM SOLR: 20.0000 mg | INTRAMUSCULAR | Status: DC | PRN

## 2016-09-25 MED ORDER — CITALOPRAM HYDROBROMIDE 20 MG PO TABS
20.0000 mg | ORAL_TABLET | Freq: Every day | ORAL | Status: DC
  Administered 2016-09-26: 20 mg via ORAL
  Filled 2016-09-25 (×2): qty 1

## 2016-09-25 MED ORDER — PANTOPRAZOLE SODIUM 40 MG PO TBEC: 40.0000 mg | DELAYED_RELEASE_TABLET | Freq: Every day | ORAL | Status: DC

## 2016-09-25 MED ORDER — ALLOPURINOL 300 MG PO TABS
300.0000 mg | ORAL_TABLET | Freq: Every day | ORAL | Status: DC
  Administered 2016-09-26 – 2016-09-29 (×4): 300 mg via ORAL
  Filled 2016-09-25 (×6): qty 1

## 2016-09-25 MED ORDER — TRAZODONE HCL 50 MG PO TABS: 50.0000 mg | ORAL_TABLET | Freq: Every evening | ORAL | Status: DC | PRN

## 2016-09-25 MED ORDER — LORAZEPAM 1 MG PO TABS: 1.0000 mg | ORAL_TABLET | ORAL | Status: DC | PRN

## 2016-09-25 MED ORDER — DOXEPIN HCL 10 MG PO CAPS
10.0000 mg | ORAL_CAPSULE | Freq: Every day | ORAL | Status: DC
  Filled 2016-09-25: qty 1

## 2016-09-25 MED ORDER — MAGNESIUM HYDROXIDE 400 MG/5ML PO SUSP: 30.0000 mL | Freq: Every day | ORAL | Status: DC | PRN

## 2016-09-25 MED ORDER — NICOTINE 21 MG/24HR TD PT24
21.0000 mg | MEDICATED_PATCH | Freq: Every day | TRANSDERMAL | Status: DC | PRN
  Filled 2016-09-25: qty 1

## 2016-09-25 MED ORDER — AMLODIPINE BESYLATE 10 MG PO TABS
10.0000 mg | ORAL_TABLET | Freq: Every day | ORAL | Status: DC
  Administered 2016-09-26 – 2016-09-29 (×4): 10 mg via ORAL
  Filled 2016-09-25 (×6): qty 1

## 2016-09-25 MED ORDER — HYDROXYZINE HCL 25 MG PO TABS
25.0000 mg | ORAL_TABLET | Freq: Three times a day (TID) | ORAL | Status: DC | PRN
  Administered 2016-09-25 – 2016-09-28 (×5): 25 mg via ORAL
  Filled 2016-09-25 (×4): qty 1

## 2016-09-25 MED ORDER — ACETAMINOPHEN 325 MG PO TABS: 650.0000 mg | ORAL_TABLET | Freq: Four times a day (QID) | ORAL | Status: DC | PRN

## 2016-09-25 MED ORDER — HYDROCHLOROTHIAZIDE 25 MG PO TABS: 25.0000 mg | ORAL_TABLET | Freq: Every day | ORAL | Status: DC

## 2016-09-25 MED ORDER — HYDROCHLOROTHIAZIDE 25 MG PO TABS
25.0000 mg | ORAL_TABLET | Freq: Every day | ORAL | Status: DC
  Administered 2016-09-26 – 2016-09-29 (×4): 25 mg via ORAL
  Filled 2016-09-25 (×6): qty 1

## 2016-09-25 MED ORDER — RISPERIDONE 2 MG PO TBDP: 2.0000 mg | ORAL_TABLET | Freq: Three times a day (TID) | ORAL | Status: DC | PRN

## 2016-09-25 MED ORDER — AMLODIPINE BESYLATE 5 MG PO TABS: 10.0000 mg | ORAL_TABLET | Freq: Every day | ORAL | Status: DC

## 2016-09-25 MED ORDER — NICOTINE 21 MG/24HR TD PT24: 21.0000 mg | MEDICATED_PATCH | Freq: Every day | TRANSDERMAL | Status: DC | PRN

## 2016-09-25 MED ORDER — DOXEPIN HCL 10 MG PO CAPS
10.0000 mg | ORAL_CAPSULE | Freq: Every day | ORAL | Status: DC
  Administered 2016-09-25: 10 mg via ORAL
  Filled 2016-09-25 (×3): qty 1

## 2016-09-25 MED ORDER — ATORVASTATIN CALCIUM 10 MG PO TABS
10.0000 mg | ORAL_TABLET | Freq: Every day | ORAL | Status: DC
  Administered 2016-09-26 – 2016-09-29 (×4): 10 mg via ORAL
  Filled 2016-09-25 (×6): qty 1

## 2016-09-25 NOTE — ED Triage Notes (Signed)
Pt reports being off of his medications for 2 weeks after they were changed by the doctor.  States he has been seeing things like rodents running when he is trying to sleep.  Denies auditory hallucinations or HI/SI.  Brought in by [facility] after several wellness calls for the past 2 days.

## 2016-09-25 NOTE — ED Notes (Signed)
Pt c/o of burning to outer part of hands, pt able to make fist, good radial pulses noted to bilateral wrists.  Pt states burning started when he was placed in handcuffs earlier today.

## 2016-09-25 NOTE — ED Notes (Signed)
Pt belongings secured in locker 

## 2016-09-25 NOTE — ED Notes (Signed)
Voice message left with Loni Beckwith, RN at [facility] that pt will be IVC'd and transferred to [facility] for further evaluation and treatment.

## 2016-09-25 NOTE — ED Notes (Signed)
Pt lying on bed watching TV.

## 2016-09-25 NOTE — ED Provider Notes (Signed)
[place] DEPT Provider Note   CSN: [id number] Arrival date & time: 09/25/16  1207     History   Chief Complaint Chief Complaint  Patient presents with  . V70.1    HPI Frank Moses is a 51 y.o. male.  HPI 51 year old male who presents with psych evaluation.he has a history of bipolar disorder and schizophrenia. Follows with Dr. Hoyle Barr from [facility]. States that he has been out of his Effexor for the past 2 weeks, and as a result has been feeling more agitated when he is talking to people. Occasionally sees a shadow or rat that runs across his vision at nighttime when he is watching TV but denies any other auditory or visual hallucinations. Denies any suicidal ideation. States that there are times when he is unable to control his anger that he does want to hurt other people, but states that that thoughts are fleeting and he would never go through with it. Has had some diarrhea but no abdominal pain, fevers, chest pain or difficulty breathing or other acute medical problems.   Past Medical History:  Diagnosis Date  . Arthritis   . Asthma   . Bipolar 1 disorder ([place])   . Depression   . Gout   . Hypertension   . Peptic ulcer   . Schizophrenia ([place])   . Stroke [facility])     Patient Active Problem List   Diagnosis Date Noted  . Special screening for malignant neoplasms, colon 01/01/2016  . PTSD (post-traumatic stress disorder) 01/09/2015  . Hyperlipidemia 01/09/2015  . MDD (major depressive disorder), recurrent, severe, with psychosis ([place]) 01/08/2015  . Alcohol use disorder, severe, dependence ([place]) 01/08/2015  . HTN (hypertension) 02/13/2014    Past Surgical History:  Procedure Laterality Date  . bil foot surgery    . CERVICAL FUSION         Home Medications    Prior to Admission medications   Medication Sig Start Date End Date Taking? Authorizing Provider  allopurinol (ZYLOPRIM) 300 MG tablet Take 300 mg by mouth daily.  09/11/16  Yes [provider]    amLODipine (NORVASC) 10 MG tablet Take 10 mg by mouth daily.   Yes [provider]  atorvastatin (LIPITOR) 10 MG tablet Take 10 mg by mouth daily.   Yes [provider]  citalopram (CELEXA) 20 MG tablet Take 20 mg by mouth daily.   Yes [provider]  doxepin (SINEQUAN) 10 MG capsule Take 10 mg by mouth at bedtime.   Yes [provider]  hydrochlorothiazide (HYDRODIURIL) 25 MG tablet Take 1 tablet (25 mg total) by mouth daily. For high blood pressure 01/12/15  Yes Nwoko, Agnes I, NP  pantoprazole (PROTONIX) 40 MG tablet Take 1 tablet (40 mg total) by mouth daily. For acid reflux 01/12/15  Yes Encarnacion Slates, NP    Family History Family History  Problem Relation Age of Onset  . Alcoholism Father     Social History Social History  Substance Use Topics  . Smoking status: Current Some Day Smoker    Packs/day: 1.00    Types: Cigarettes  . Smokeless tobacco: Never Used  . Alcohol use Yes     Comment: weekly     Allergies   Other; Shrimp [shellfish allergy]; Orange fruit [citrus]; and Tomato   Review of Systems Review of Systems  Constitutional: Negative for fever.  HENT: Negative for congestion.   Respiratory: Negative for cough and shortness of breath.   Cardiovascular: Negative for chest pain.  All other systems reviewed and are negative.    Physical Exam Updated Vital Signs BP (!) 154/101 (BP Location: Right Arm)   Pulse (!) 111   Temp 98.6 F (37 C) (Oral)   Resp 20   Ht 6\' 2"  (1.88 m)   Wt 90.3 kg (199 lb)   SpO2 95%   BMI 25.55 kg/m   Physical Exam Physical Exam  Nursing note and vitals reviewed. Constitutional: Well developed, well nourished, non-toxic, and in no acute distress Head: Normocephalic and atraumatic.  Mouth/Throat: Oropharynx is clear and moist.  Neck: Normal range of motion. Neck supple.  Cardiovascular: Normal rate and regular rhythm.   Pulmonary/Chest: Effort normal and breath sounds normal.   Abdominal: Soft. There is no tenderness. There is no rebound and no guarding.  Musculoskeletal: Normal range of motion.  Neurological: Alert, no facial droop, fluent speech, moves all extremities symmetrically Skin: Skin is warm and dry.  Psychiatric: Cooperative   ED Treatments / Results  Labs (all labs ordered are listed, but only abnormal results are displayed) Labs Reviewed  COMPREHENSIVE METABOLIC PANEL - Abnormal; Notable for the following:       Result Value   Potassium 3.4 (*)    Glucose, Bld 154 (*)    AST 61 (*)    All other components within normal limits  ACETAMINOPHEN LEVEL - Abnormal; Notable for the following:    Acetaminophen (Tylenol), Serum <10 (*)    All other components within normal limits  CBC WITH DIFFERENTIAL/PLATELET  SALICYLATE LEVEL  ETHANOL  RAPID URINE DRUG SCREEN, HOSP PERFORMED    EKG  EKG Interpretation None       Radiology No results found.  Procedures Procedures (including critical care time)  Medications Ordered in ED Medications - No data to display   Initial Impression / Assessment and Plan / ED Course  I have reviewed the triage vital signs and the nursing notes.  Pertinent labs & imaging results that were available during my care of the patient were reviewed by me and considered in my medical decision making (see chart for details).      Records reviewed. Seen in ED 3 days ago for same complaints. Although patient states he is still taking this medication just ran out, it was documented that he was supposed to stop taking effexor and start taking celexa, but he stated he will continue taking effexor until medication runs out.   He was seen in his PCP's office yesterday. He was documented to be very agitated and verbally aggressive. According to notes, he had threatened to take all of his Celexa as retaliation against his psychiatrist. He was also documents to say that if he went to [facility] he will hurt the people who works  there. Did does not appear IVC paperwork was filed. [facility] did check in on him today, and called [employer] to take him to ED for emergency psychiatric evaluation.  At this time, cooperative. Without signs of psychosis. Exam unremarkable. Will obtain medical screening labs, but felt medically cleared for TTS consult.  Final Clinical Impressions(s) / ED Diagnoses   Final diagnoses:  Psychiatric problem    New Prescriptions New Prescriptions   No medications on file     Forde Dandy, MD 09/25/16 [phone]

## 2016-09-25 NOTE — ED Notes (Signed)
Pt refuses all medications. Pt says the only medication he needs or wants is Effexor. Pt's immediate stressor are bills that he needs to pay today.

## 2016-09-25 NOTE — ED Notes (Signed)
Pt is calm and compliant. Pt states he was taken off his Effexor abruptly causing his agitation.

## 2016-09-25 NOTE — Progress Notes (Signed)
Frank Moses is a 51 year old male admitted on involuntary basis from [facility]. On admission, Jarquis presents as irritable and preoccupied. He denies any SI and is able to contract for safety while on the unit. He reports that [facility] took him off his effexor and put him on something else and that he was not told why he was being taken off the medication. When asked about [facility] and the allegations, Zayin became more agitated and defensive and reports that Chinita Pester is lying about the situation. He reports that he has been on effexor for 15 years and all he wants is his medications back and does not want to take the other medications that they prescribed for him. Levert was able to complete admission process, was oriented to the unit and safety maintained.

## 2016-09-25 NOTE — ED Notes (Signed)
RN attempted to call [facility] for collateral information. Per receptionist-[facility] nurse is already speaking with someone regarding this pt. [facility] charge nurse name and # given to receptionist. Awaiting return call.

## 2016-09-25 NOTE — ED Provider Notes (Signed)
TTS has evaluated pt: recommends inpt treatment. Pt becoming agitated, threatening to leave the ED. IVC paperwork completed. Holding orders written.    Francine Graven, DO 09/25/16 1702

## 2016-09-25 NOTE — Tx Team (Signed)
Initial Treatment Plan 09/25/2016 8:54 PM Frank Moses [id number]    PATIENT STRESSORS: Medication change or noncompliance   PATIENT STRENGTHS: Ability for insight Average or above average intelligence Capable of independent living General fund of knowledge   PATIENT IDENTIFIED PROBLEMS: Depression Suicidal thoughts Homicidal thoughts "I want to get back on effexor and go home"                     DISCHARGE CRITERIA:  Ability to meet basic life and health needs Improved stabilization in mood, thinking, and/or behavior Verbal commitment to aftercare and medication compliance  PRELIMINARY DISCHARGE PLAN: Attend aftercare/continuing care group Return to previous living arrangement  PATIENT/FAMILY INVOLVEMENT: This treatment plan has been presented to and reviewed with the patient, Frank Moses, and/or family member, .  The patient and family have been given the opportunity to ask questions and make suggestions.  [place], [place], [place] 09/25/2016, 8:54 PM

## 2016-09-25 NOTE — ED Provider Notes (Signed)
Pt accepted to [facility], will transfer stable.    Francine Graven, DO 09/25/16 1733

## 2016-09-25 NOTE — BH Assessment (Signed)
[facility] Assessment Progress Note  Loni Beckwith, RN, at [facility] in [place] requests several times to be called [phone]) if pt is to be released so that [facility] can make appropriate preparations to protect themselves as they consider pt to be a threat to them. She requests a message be left if she is not available.

## 2016-09-25 NOTE — Progress Notes (Signed)
Pt accepted to [place], Bed 508-1.  Frank Closs, NP accepting provider, Dr. Shea Evans is the attending provider.  Call report to [phone].  Pt is being IVC'd and will need to be transported by Event organiser.  Pt may come at any time.  Areatha Keas. Judi Cong, MSW, [place] Disposition Clinical Social Work [phone] (cell) [phone] (office)

## 2016-09-25 NOTE — ED Notes (Signed)
Reported by [facility], NT that pt refused vitals before leaving for [facility]
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
## 2016-09-25 NOTE — ED Notes (Signed)
When entering room to get vitals so patient could be transferred to [facility]. Patient states that he was not going to allow me to take vitals and that he was not leaving. Stated to patient that [facility] was here to take him patient states " I been here all fucking day and I don't need that and Im not leaving. RN Theadora Rama and Charge nurse Hassan Rowan is made aware of patient's statement.

## 2016-09-26 DIAGNOSIS — R451 Restlessness and agitation: Secondary | ICD-10-CM

## 2016-09-26 DIAGNOSIS — F1020 Alcohol dependence, uncomplicated: Secondary | ICD-10-CM

## 2016-09-26 DIAGNOSIS — F17210 Nicotine dependence, cigarettes, uncomplicated: Secondary | ICD-10-CM

## 2016-09-26 DIAGNOSIS — Z811 Family history of alcohol abuse and dependence: Secondary | ICD-10-CM

## 2016-09-26 DIAGNOSIS — F251 Schizoaffective disorder, depressive type: Principal | ICD-10-CM

## 2016-09-26 DIAGNOSIS — R45851 Suicidal ideations: Secondary | ICD-10-CM

## 2016-09-26 DIAGNOSIS — R45850 Homicidal ideations: Secondary | ICD-10-CM

## 2016-09-26 DIAGNOSIS — F29 Unspecified psychosis not due to a substance or known physiological condition: Secondary | ICD-10-CM

## 2016-09-26 MED ORDER — QUETIAPINE FUMARATE 100 MG PO TABS
100.0000 mg | ORAL_TABLET | Freq: Every day | ORAL | Status: DC
  Administered 2016-09-26 – 2016-09-28 (×3): 100 mg via ORAL
  Filled 2016-09-26 (×5): qty 1

## 2016-09-26 MED ORDER — VENLAFAXINE HCL ER 150 MG PO CP24
150.0000 mg | ORAL_CAPSULE | Freq: Every day | ORAL | Status: DC
  Administered 2016-09-27 – 2016-09-29 (×3): 150 mg via ORAL
  Filled 2016-09-26 (×6): qty 1

## 2016-09-26 NOTE — Progress Notes (Signed)
Recreation Therapy Notes  INPATIENT RECREATION THERAPY ASSESSMENT  Patient Details Name: Frank Moses MRN: [id number] DOB: 02-04-1965 Today's Date: 09/26/2016  Patient Stressors: Other (Comment) (Not having medications)  Pt stated he was here because he did not have his medication.  Coping Skills:   Isolate, Arguments, Avoidance, Exercise, Talking, Music  Personal Challenges: Anger, Communication, Concentration, Decision-Making, Expressing Yourself, Relationships, Stress Management  Leisure Interests (2+):  Individual - TV, Community - Other (Comment), Games - Other (Comment), Individual - Other (Comment) (Horseshoes, sporting events, cooking)  Awareness of Community Resources:  Yes  Community Resources:  Library, [employer], Other (Comment) Print production planner)  Current Use: No  If no, Barriers?: Other (Comment) (Distance)  Patient Strengths:  Love to cook; Help others  Patient Identified Areas of Improvement:  Communication; Anger  Current Recreation Participation:  Everyday  Patient Goal for Hospitalization:  "Get back on track with medications so I can spend time with my daughters and grandbabies"  [place] of Residence:  [place] of Residence:  [place]  Current [place] (including self-harm):  No  Current HI:  No  Consent to Intern Participation: N/A   Victorino Sparrow, LRT/CTRS  Victorino Sparrow A 09/26/2016, 1:43 PM
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
## 2016-09-26 NOTE — Progress Notes (Signed)
[place] Group Notes:  (Nursing/MHT/Case Management/Adjunct)  Date:  09/26/2016  Time:  9:18 PM  Type of Therapy:  Psychoeducational Skills  Participation Level:  Active  Participation Quality:  Appropriate  Affect:  Appropriate  Cognitive:  Appropriate  Insight:  Good  Engagement in Group:  Engaged  Modes of Intervention:  Education  Summary of Progress/Problems: Patient states that he had a great day since he feels that he is "back on track" in terms of his medication. He states that he feels better in general and that he was pleased with himself for having a good talk with his mother with out losing his temper. In terms of the theme for the day, his coping skill will be cooking.   Archie Balboa S 09/26/2016, 9:18 PM

## 2016-09-26 NOTE — Progress Notes (Signed)
Patient ID: Frank Moses, male   DOB: 09/09/1965, 51 y.o.   MRN: [id number]  Pt currently presents with an appropriate affect and cooperative behavior. Pt attends group tonight. Pt states "It was a great day today, I will start my Effexor in the morning and I have my Seroquel to take tonight." Pt reports poor sleep with medication he took last night.   Pt provided with medications per providers orders. Pt's labs and vitals were monitored throughout the night. Pt given a 1:1 about emotional and mental status. Pt supported and encouraged to express concerns and questions. Pt educated on medications.  Pt's safety ensured with 15 minute and environmental checks. Pt currently denies SI/HI and A/V hallucinations. Pt verbally agrees to seek staff if SI/HI or A/VH occurs and to consult with staff before acting on any harmful thoughts. Will continue POC.

## 2016-09-26 NOTE — Progress Notes (Signed)
Patient ID: CAS TRACZ, male   DOB: 05-04-1965, 51 y.o.   MRN: [id number]  Report received from admitting nurse. Pt currently denies SI/HI and A/V hallucinations. Pt verbally agrees to seek staff if SI/HI or A/VH occurs and to consult with staff before acting on any harmful thoughts. Pt given nighttime medications. In no current distress. Will continue POC.

## 2016-09-26 NOTE — BHH Suicide Risk Assessment (Signed)
[facility] Admission Suicide Risk Assessment   Nursing information obtained from:    Demographic factors:    Current Mental Status:    Loss Factors:    Historical Factors:    Risk Reduction Factors:     Total Time spent with patient: 1 hour Principal Problem: Schizophrenia ([place]) Diagnosis:   Patient Active Problem List   Diagnosis Date Noted  . Schizophrenia ([place]) [F20.9] 09/25/2016  . Special screening for malignant neoplasms, colon [Z12.11] 01/01/2016  . PTSD (post-traumatic stress disorder) [F43.10] 01/09/2015  . Hyperlipidemia [E78.5] 01/09/2015  . MDD (major depressive disorder), recurrent, severe, with psychosis ([place]) [F33.3] 01/08/2015  . Alcohol use disorder, severe, dependence ([place]) [F10.20] 01/08/2015  . HTN (hypertension) [I10] 02/13/2014   Subjective Data: Frank Moses is a 51 y.o. male admitted emergently, involuntarily for increased symptoms depression, irritability, snappy, frustrated, agitation and aggressive and makes threats to hurt other people. He has history of alcohol use disorder, PTSD, MDD, recurrent and schizophrenia. Reportedly he was noncompliant with medication management. He has history of incarceration against his GF in 2016 and than separated.    Continued Clinical Symptoms:  Alcohol Use Disorder Identification Test Final Score (AUDIT): 7 The "Alcohol Use Disorders Identification Test", Guidelines for Use in Primary Care, Second Edition.  World Pharmacologist [facility]). Score between 0-7:  no or low risk or alcohol related problems. Score between 8-15:  moderate risk of alcohol related problems. Score between 16-19:  high risk of alcohol related problems. Score 20 or above:  warrants further diagnostic evaluation for alcohol dependence and treatment.   CLINICAL FACTORS:   Severe Anxiety and/or Agitation Bipolar Disorder:   Mixed State Depression:   Aggression Anhedonia Impulsivity Insomnia Recent sense of peace/wellbeing Severe Alcohol/Substance  Abuse/Dependencies Schizophrenia:   Depressive state Currently Psychotic Previous Psychiatric Diagnoses and Treatments   Psychiatric Specialty Exam: Physical Exam  ROS  Blood pressure 127/82, pulse 64, temperature 98.7 F (37.1 C), temperature source Oral, resp. rate 18, height 6\' 2"  (1.88 m), weight 90.3 kg (199 lb).Body mass index is 25.55 kg/m.     COGNITIVE FEATURES THAT CONTRIBUTE TO RISK:  Closed-mindedness and Polarized thinking    SUICIDE RISK:   Moderate:  Frequent suicidal ideation with limited intensity, and duration, some specificity in terms of plans, no associated intent, good self-control, limited dysphoria/symptomatology, some risk factors present, and identifiable protective factors, including available and accessible social support.  PLAN OF CARE: Admit for increased symptoms of depression, agitation, irritability and threatening others. He needs safety monitoring, medication management and crisis stabilization.   I certify that inpatient services furnished can reasonably be expected to improve the patient's condition.   Ambrose Finland, MD 09/26/2016, 10:40 AM
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
## 2016-09-26 NOTE — Progress Notes (Signed)
Recreation Therapy Notes  Date: 09/26/16 Time: 1000 Location: 500 Hall Dayroom  Group Topic: Leisure Education  Goal Area(s) Addresses:  Patient will identify positive leisure activities.  Patient will identify one positive benefit of participation in leisure activities.   Behavioral Response: Engaged  Intervention: Chairs, small beach ball  Activity: Keep It Chartered certified accountant.  Patients were arranged in a circle.  Patients were to pass the ball back and forth to each other.  Patients could bounce the ball off of the floor but the ball could not come to a complete stop.  LRT would count the number of hits the group was able to get before the ball stopped.  It the ball stopped moving at any point, LRT would start the count over.  Education:  Leisure Education, Dentist  Education Outcome: Acknowledges education/In group clarification offered/Needs additional education  Clinical Observations/Feedback: Pt was engaged and social with peers.  Pt left early to meet with doctor and did not return.     Victorino Sparrow, LRT/CTRS         Victorino Sparrow A 09/26/2016 11:42 AM

## 2016-09-26 NOTE — Tx Team (Signed)
Interdisciplinary Treatment and Diagnostic Plan Update  09/26/2016 Time of Session: 1:24 PM  Frank Moses MRN: [id number]  Principal Diagnosis: Schizoaffective disorder, depressive type [facility])  Secondary Diagnoses: Principal Problem:   Schizoaffective disorder, depressive type ([place]) Active Problems:   Schizophrenia ([place])   Current Medications:  Current Facility-Administered Medications  Medication Dose Route Frequency Provider Last Rate Last Dose  . acetaminophen (TYLENOL) tablet 650 mg  650 mg Oral Q6H PRN Okonkwo, Justina A, NP      . allopurinol (ZYLOPRIM) tablet 300 mg  300 mg Oral Daily Okonkwo, Justina A, NP   300 mg at 09/26/16 0814  . alum & mag hydroxide-simeth (MAALOX/MYLANTA) 200-200-20 MG/5ML suspension 30 mL  30 mL Oral Q4H PRN Okonkwo, Justina A, NP      . amLODipine (NORVASC) tablet 10 mg  10 mg Oral Daily Okonkwo, Justina A, NP   10 mg at 09/26/16 0814  . atorvastatin (LIPITOR) tablet 10 mg  10 mg Oral Daily Okonkwo, Justina A, NP   10 mg at 09/26/16 0814  . hydrochlorothiazide (HYDRODIURIL) tablet 25 mg  25 mg Oral Daily Okonkwo, Justina A, NP   25 mg at 09/26/16 0814  . hydrOXYzine (ATARAX/VISTARIL) tablet 25 mg  25 mg Oral TID PRN Hughie Closs A, NP   25 mg at 09/25/16 2107  . risperiDONE (RISPERDAL M-TABS) disintegrating tablet 2 mg  2 mg Oral Q8H PRN Lu Duffel, Justina A, NP       And  . LORazepam (ATIVAN) tablet 1 mg  1 mg Oral PRN Okonkwo, Justina A, NP       And  . ziprasidone (GEODON) injection 20 mg  20 mg Intramuscular PRN Okonkwo, Justina A, NP      . magnesium hydroxide (MILK OF MAGNESIA) suspension 30 mL  30 mL Oral Daily PRN Okonkwo, Justina A, NP      . nicotine (NICODERM CQ - dosed in mg/24 hours) patch 21 mg  21 mg Transdermal Daily PRN Okonkwo, Justina A, NP      . pantoprazole (PROTONIX) EC tablet 40 mg  40 mg Oral Daily Okonkwo, Justina A, NP   40 mg at 09/26/16 0814  . QUEtiapine (SEROQUEL) tablet 100 mg  100 mg Oral QHS Ambrose Finland,  MD      . venlafaxine XR (EFFEXOR-XR) 24 hr capsule 150 mg  150 mg Oral Q breakfast Ambrose Finland, MD        PTA Medications: Prescriptions Prior to Admission  Medication Sig Dispense Refill Last Dose  . allopurinol (ZYLOPRIM) 300 MG tablet Take 300 mg by mouth daily.    Past Week at Unknown time  . amLODipine (NORVASC) 10 MG tablet Take 10 mg by mouth daily.   Past Week at Unknown time  . atorvastatin (LIPITOR) 10 MG tablet Take 10 mg by mouth daily.   Past Week at Unknown time  . citalopram (CELEXA) 20 MG tablet Take 20 mg by mouth daily.   Past Week at Unknown time  . doxepin (SINEQUAN) 10 MG capsule Take 10 mg by mouth at bedtime.   Past Week at Unknown time  . hydrochlorothiazide (HYDRODIURIL) 25 MG tablet Take 1 tablet (25 mg total) by mouth daily. For high blood pressure   Past Week at Unknown time  . pantoprazole (PROTONIX) 40 MG tablet Take 1 tablet (40 mg total) by mouth daily. For acid reflux   Past Week at Unknown time    Patient Stressors: Medication change or noncompliance  Patient Strengths: Ability for insight Average  or above average intelligence Capable of independent living General fund of knowledge  Treatment Modalities: Medication Management, Group therapy, Case management,  1 to 1 session with clinician, Psychoeducation, Recreational therapy.   Physician Treatment Plan for Primary Diagnosis: Schizoaffective disorder, depressive type ([place]) Long Term Goal(s): Improvement in symptoms so as ready for discharge  Short Term Goals: Ability to identify changes in lifestyle to reduce recurrence of condition will improve Ability to verbalize feelings will improve Ability to disclose and discuss suicidal ideas Ability to demonstrate self-control will improve Ability to identify and develop effective coping behaviors will improve Ability to maintain clinical measurements within normal limits will improve Compliance with prescribed medications will  improve Ability to identify triggers associated with substance abuse/mental health issues will improve  Medication Management: Evaluate patient's response, side effects, and tolerance of medication regimen.  Therapeutic Interventions: 1 to 1 sessions, Unit Group sessions and Medication administration.  Evaluation of Outcomes: Progressing  Physician Treatment Plan for Secondary Diagnosis: Principal Problem:   Schizoaffective disorder, depressive type ([place]) Active Problems:   Schizophrenia ([place])   Long Term Goal(s): Improvement in symptoms so as ready for discharge  Short Term Goals: Ability to identify changes in lifestyle to reduce recurrence of condition will improve Ability to verbalize feelings will improve Ability to disclose and discuss suicidal ideas Ability to demonstrate self-control will improve Ability to identify and develop effective coping behaviors will improve Ability to maintain clinical measurements within normal limits will improve Compliance with prescribed medications will improve Ability to identify triggers associated with substance abuse/mental health issues will improve  Medication Management: Evaluate patient's response, side effects, and tolerance of medication regimen.  Therapeutic Interventions: 1 to 1 sessions, Unit Group sessions and Medication administration.  Evaluation of Outcomes: Progressing   RN Treatment Plan for Primary Diagnosis: Schizoaffective disorder, depressive type ([place]) Long Term Goal(s): Knowledge of disease and therapeutic regimen to maintain health will improve  Short Term Goals: Ability to identify and develop effective coping behaviors will improve and Compliance with prescribed medications will improve  Medication Management: RN will administer medications as ordered by provider, will assess and evaluate patient's response and provide education to patient for prescribed medication. RN will report any adverse and/or side effects  to prescribing provider.  Therapeutic Interventions: 1 on 1 counseling sessions, Psychoeducation, Medication administration, Evaluate responses to treatment, Monitor vital signs and CBGs as ordered, Perform/monitor CIWA, COWS, AIMS and Fall Risk screenings as ordered, Perform wound care treatments as ordered.  Evaluation of Outcomes: Progressing    Recreational Therapy Treatment Plan for Primary Diagnosis: Schizoaffective disorder, depressive type ([place]) Long Term Goal(s): Patient will participate in recreation therapy treatment in at least 2 group sessions without prompting from LRT  Short Term Goals: Patient will be able to identify at least 5 coping skills for admitting diagnosis by conclusion of recreation therapy treatment  Treatment Modalities: Group and Pet Therapy  Therapeutic Interventions: Psychoeducation  Evaluation of Outcomes: Progressing   LCSW Treatment Plan for Primary Diagnosis: Schizoaffective disorder, depressive type ([place]) Long Term Goal(s): Safe transition to appropriate next level of care at discharge, Engage patient in therapeutic group addressing interpersonal concerns.  Short Term Goals: Engage patient in aftercare planning with referrals and resources  Therapeutic Interventions: Assess for all discharge needs, 1 to 1 time with Social worker, Explore available resources and support systems, Assess for adequacy in community support network, Educate family and significant other(s) on suicide prevention, Complete Psychosocial Assessment, Interpersonal group therapy.  Evaluation of Outcomes: Met  Return  home, follow up [facility] [place] in Treatment: Attending groups: Yes Participating in groups: Yes Taking medication as prescribed: Yes Toleration medication: Yes, no side effects reported at this time Family/Significant other contact made: No Patient understands diagnosis: No Limited insight Discussing patient identified problems/goals with staff:  Yes Medical problems stabilized or resolved: Yes Denies suicidal/homicidal ideation: Yes Issues/concerns per patient self-inventory: None Other: N/A  New problem(s) identified: None identified at this time.   New Short Term/Long Term Goal(s): "I just need to get back on my Effexor.  They wouldn't give it to me at [facility].  I don't know why they wouldn't."  Discharge Plan or Barriers:   Reason for Continuation of Hospitalization: Mood instability Depression  Homicidal ideation  Medication stabilization   Estimated Length of Stay: 9/5  Attendees: Patient: Frank Moses 09/26/2016  1:24 PM  Physician: Ursula Alert, MD 09/26/2016  1:24 PM  Nursing: Sena Hitch, RN 09/26/2016  1:24 PM  RN Care Manager: Lars Pinks, RN 09/26/2016  1:24 PM  Social Worker: Ripley Fraise 09/26/2016  1:24 PM  Recreational Therapist: Victorino Sparrow, LRT/CTRS 09/26/2016  1:24 PM  Other: Norberto Sorenson 09/26/2016  1:24 PM  Other:  09/26/2016  1:24 PM    Scribe for Treatment Team:  Roque Lias LCSW 09/26/2016 1:24 PM

## 2016-09-26 NOTE — Progress Notes (Signed)
Nursing Note: 0700-1900  D:  Pt present with pleasant mood and anxious affect.  "I just do not want to take Celexa, that is like Xanax and I don't want anything to do with that."  Pt states that he is upset about the MD changing his med regimen "but I took Effexor for 15 years, I'm not going to hurt that doctor." Pt did take Celexa this am, Effector to start tomorrow. Goal for today, "To get back on track with my correct medicine.  He rates depression 3/10, hopelessness 0/10 and anxiety 2/10 today. No complaints of physical pain today.  A:  Encouraged to verbalize needs and concerns, active listening and support provided.  Continued Q 15 minute safety checks.  Observed active participation in group settings.  R:  Pt. denies A/V hallucinations and is able to verbally contract for safety.

## 2016-09-26 NOTE — BHH Suicide Risk Assessment (Signed)
[place] INPATIENT:  Family/Significant Other Suicide Prevention Education  Suicide Prevention Education:  Patient Refusal for Family/Significant Other Suicide Prevention Education: The patient Frank Moses has refused to provide written consent for family/significant other to be provided Family/Significant Other Suicide Prevention Education during admission and/or prior to discharge.  Physician notified.  [name] 09/26/2016, 2:36 PM

## 2016-09-26 NOTE — BHH Group Notes (Signed)
[place] LCSW Group Therapy  09/26/2016  1:05 PM  Type of Therapy:  Group therapy  Participation Level:  Active  Participation Quality:  Attentive  Affect:  Flat  Cognitive:  Oriented  Insight:  Limited  Engagement in Therapy:  Limited  Modes of Intervention:  Discussion, Socialization  Summary of Progress/Problems:  Chaplain was here to lead a group on themes of hope and courage. "Family means everything to me.  My mother is number one on the list.  She knows me better than anyone.  She was upset before I came in because I was upset."  Referred to an anger issue that he has, but vague on details.  Frank Moses 09/26/2016 1:22 PM

## 2016-09-27 NOTE — BHH Group Notes (Signed)
  BHH/BMU LCSW Group Therapy Note  Date/Time:  09/27/2016 11:15AM-12:00PM  Type of Therapy and Topic:  Group Therapy:  Feelings About Hospitalization  Participation Level:  Active   Description of Group This process group involved patients discussing their feelings related to being hospitalized, as well as the benefits they see to being in the hospital.  These feelings and benefits were itemized.  The group then brainstormed specific ways in which they could seek those same benefits when they discharge and return home.  Therapeutic Goals 1. Patient will identify and describe positive and negative feelings related to hospitalization 2. Patient will verbalize benefits of hospitalization to themselves personally 3. Patients will brainstorm together ways they can obtain similar benefits in the outpatient setting, identify barriers to wellness and possible solutions  Summary of Patient Progress:  The patient expressed his primary feelings about being hospitalized are "it's a relief" because he stated "I need my medicines to survive."  He was one of the most interactive group members, was appropriate and helpful, encouraging to others.  Therapeutic Modalities Cognitive Behavioral Therapy Motivational Interviewing    Selmer Dominion, LCSW 09/27/2016, 1:10 PM

## 2016-09-27 NOTE — BHH Group Notes (Signed)
[facility] Group Notes:  (Nursing/MHT/Case Management/Adjunct)  Date:  09/27/2016  Time:  2:18 PM  Type of Therapy:  Nurse Education  Participation Level:  Active  Participation Quality:  Appropriate and Attentive  Affect:  Appropriate  Cognitive:  Alert and Oriented  Insight:  Appropriate  Engagement in Group:  Engaged  Modes of Intervention:  Discussion and Education  Summary of Progress/Problems: Coping Skills Group: Patient stated that his best coping skill is cooking,  and that a positive attribute he has is that he keeps his house clean. Frank Moses [name] 09/27/2016, 2:18 PM

## 2016-09-27 NOTE — Progress Notes (Signed)
Pleasantly interactive in the milieu, talking with peers, staff and nursing students. Attended and participated in groups; expressed interest in a peer support mental health group where he could share his experiences to help others. Requests Effexor to be increased to 225mg .

## 2016-09-27 NOTE — Progress Notes (Signed)
[facility] [facility] MD Progress Note  09/27/2016 4:35 PM JARAMIE BASTOS  MRN:  [id number]  Subjective: Bing reports, "My mood is not great but I feel good today now I'm back on the Effexor. I feel excited too because I was told that I will be speaking to bunch of young people about dealing with mental illness. It is suppose to be inspirational. My whole system got messed up after my outpatient person abruptly took me off of Effexor & put me on some 20 mg medicine. Now that I'm back on Seroquel, I slept well last night. No, I'm not overly depressed today. No voices. The group we had today is encouraging. I personally enjoyed it".  Objective: Jehan is seen, chart reviewed. Discussed this case with the treatment team. Patient is visible on the unit. He is attending group sessions. He is cooperative with staff. He is tolerating his medications. No disruptive behavior on the unit. He denies any adverse effects from his medications. Good eye contact noted.  Principal Problem: Schizoaffective disorder, depressive type ([place])  Diagnosis:   Patient Active Problem List   Diagnosis Date Noted  . Schizophrenia ([place]) [F20.9] 09/25/2016  . Special screening for malignant neoplasms, colon [Z12.11] 01/01/2016  . PTSD (post-traumatic stress disorder) [F43.10] 01/09/2015  . Hyperlipidemia [E78.5] 01/09/2015  . MDD (major depressive disorder), recurrent, severe, with psychosis ([place]) [F33.3] 01/08/2015  . Alcohol use disorder, severe, dependence ([place]) [F10.20] 01/08/2015  . Schizoaffective disorder, depressive type ([place]) [F25.1] 01/06/2015  . HTN (hypertension) [I10] 02/13/2014   Total Time spent with patient: 25 minutes  Past Psychiatric History: Schizoaffective disorder, Alcohol use disorder, severe, dependence.  Past Medical History:  Past Medical History:  Diagnosis Date  . Arthritis   . Asthma   . Bipolar 1 disorder ([place])   . Depression   . Gout   . Hypertension   . Peptic ulcer   . Schizophrenia ([place])   .  Stroke [facility])     Past Surgical History:  Procedure Laterality Date  . bil foot surgery    . CERVICAL FUSION     Family History:  Family History  Problem Relation Age of Onset  . Alcoholism Father    Family Psychiatric  History: See H&P.  Social History:  History  Alcohol Use  . Yes    Comment: weekly     History  Drug Use No    Social History   Social History  . Marital status: Single    Spouse name: N/A  . Number of children: N/A  . Years of education: N/A   Social History Main Topics  . Smoking status: Current Some Day Smoker    Packs/day: 1.00    Types: Cigarettes  . Smokeless tobacco: Never Used  . Alcohol use Yes     Comment: weekly  . Drug use: No  . Sexual activity: Yes    Birth control/ protection: None   Other Topics Concern  . None   Social History Narrative  . None   Additional Social History:   Sleep: Fair  Appetite:  Good  Current Medications: Current Facility-Administered Medications  Medication Dose Route Frequency Provider Last Rate Last Dose  . acetaminophen (TYLENOL) tablet 650 mg  650 mg Oral Q6H PRN Okonkwo, Justina A, NP      . allopurinol (ZYLOPRIM) tablet 300 mg  300 mg Oral Daily Okonkwo, Justina A, NP   300 mg at 09/27/16 0830  . alum & mag hydroxide-simeth (MAALOX/MYLANTA) 200-200-20 MG/5ML suspension 30 mL  30  mL Oral Q4H PRN Lu Duffel, Justina A, NP      . amLODipine (NORVASC) tablet 10 mg  10 mg Oral Daily Okonkwo, Justina A, NP   10 mg at 09/27/16 0830  . atorvastatin (LIPITOR) tablet 10 mg  10 mg Oral Daily Okonkwo, Justina A, NP   10 mg at 09/27/16 0830  . hydrochlorothiazide (HYDRODIURIL) tablet 25 mg  25 mg Oral Daily Okonkwo, Justina A, NP   25 mg at 09/27/16 0831  . hydrOXYzine (ATARAX/VISTARIL) tablet 25 mg  25 mg Oral TID PRN Hughie Closs A, NP   25 mg at 09/26/16 2110  . risperiDONE (RISPERDAL M-TABS) disintegrating tablet 2 mg  2 mg Oral Q8H PRN Lu Duffel, Justina A, NP       And  . LORazepam (ATIVAN) tablet 1  mg  1 mg Oral PRN Okonkwo, Justina A, NP       And  . ziprasidone (GEODON) injection 20 mg  20 mg Intramuscular PRN Okonkwo, Justina A, NP      . magnesium hydroxide (MILK OF MAGNESIA) suspension 30 mL  30 mL Oral Daily PRN Okonkwo, Justina A, NP      . nicotine (NICODERM CQ - dosed in mg/24 hours) patch 21 mg  21 mg Transdermal Daily PRN Okonkwo, Justina A, NP      . pantoprazole (PROTONIX) EC tablet 40 mg  40 mg Oral Daily Okonkwo, Justina A, NP   40 mg at 09/27/16 0830  . QUEtiapine (SEROQUEL) tablet 100 mg  100 mg Oral QHS Ambrose Finland, MD   100 mg at 09/26/16 2110  . venlafaxine XR (EFFEXOR-XR) 24 hr capsule 150 mg  150 mg Oral Q breakfast Ambrose Finland, MD   150 mg at 09/27/16 0830   Lab Results: No results found for this or any previous visit (from the past 48 hour(s)).  Blood Alcohol level:  Lab Results  Component Value Date   [facility] <5 09/25/2016   ETH <5 52/84/1324   Metabolic Disorder Labs: Lab Results  Component Value Date   HGBA1C 6.0 (H) 01/09/2015   MPG 126 01/09/2015   Lab Results  Component Value Date   PROLACTIN 8.3 01/09/2015   Lab Results  Component Value Date   CHOL 213 (H) 01/09/2015   TRIG 378 (H) 01/09/2015   HDL 36 (L) 01/09/2015   CHOLHDL 5.9 01/09/2015   VLDL 76 (H) 01/09/2015   LDLCALC 101 (H) 01/09/2015   Physical Findings: AIMS: Facial and Oral Movements Muscles of Facial Expression: None, normal Lips and Perioral Area: None, normal Jaw: None, normal Tongue: None, normal,Extremity Movements Upper (arms, wrists, hands, fingers): None, normal Lower (legs, knees, ankles, toes): None, normal, Trunk Movements Neck, shoulders, hips: None, normal, Overall Severity Severity of abnormal movements (highest score from questions above): None, normal Incapacitation due to abnormal movements: None, normal Patient's awareness of abnormal movements (rate only patient's report): No Awareness, Dental Status Current problems with teeth  and/or dentures?: No Does patient usually wear dentures?: No  CIWA:    COWS:     Musculoskeletal: Strength & Muscle Tone: within normal limits Gait & Station: normal Patient leans: N/A  Psychiatric Specialty Exam: Physical Exam: Nurses notes & vital signs reviewed.  Review of Systems  Psychiatric/Behavioral: Positive for depression ("Improving").  :   Blood pressure 124/87, pulse 78, temperature 98.1 F (36.7 C), temperature source Oral, resp. rate 18, height 6\' 2"  (1.88 m), weight 90.3 kg (199 lb).Body mass index is 25.55 kg/m.  General Appearance: Cashual, groomed  Eye  Contact:  Good  Speech:  Clear and Coherent  Volume:  Normal  Mood: "Improving"  Affect: Congruent.  Thought Process: Coherent and Goal Directed  Orientation:  Full (Time, Place, and Person)  Thought Content:  Rumination  Suicidal Thoughts:  Yes.  without intent/plan  Homicidal Thoughts:  Yes.  without intent/plan  Memory:  Immediate;   Good Recent;   Fair Remote;   Fair  Judgement:  Impaired  Insight:  Good  Psychomotor Activity:  Increased and Restlessness  Concentration:  Concentration: Fair and Attention Span: Fair  Recall:  Good  Fund of Knowledge:  Good  Language:  Good  Akathisia:  Negative  Handed:  Right  AIMS (if indicated):     Assets:  Communication Skills Desire for Improvement  ADL's:  Intact  Cognition:  WNL  Sleep:  Number of Hours: 4.25     Treatment Plan Summary: Daily contact with patient to assess and evaluate symptoms and progress in treatment. -Continue Seroquel 100 mg Q hs for mood control. -Continue Risperdal disintegrating tabs 2 mg tid prn for agitation/psychosis. -Continue Effexor XR 150 mg daily for depression. -Continue routine PRN for psychosis/agitations. -Continue other medications for other medical issues.  - Continue 15 minutes observation for safety concerns - Encouraged to participate in milieu therapy and group therapy counseling sessions and also work  with coping skills -  Develop treatment plan to decrease risks & the need for readmission. -  Psycho-social education regarding self care. - Health care follow up as needed for medical problems. - Restart home medications where appropriate.  Encarnacion Slates, NP, PMHNP, FNP-BC. 09/27/2016, 4:35 PM

## 2016-09-27 NOTE — Plan of Care (Signed)
Problem: Safety: Goal: Periods of time without injury will increase Outcome: Progressing Pt remains safe on the unit 09/26/2016

## 2016-09-27 NOTE — Progress Notes (Signed)
Patient ID: Frank Moses, male   DOB: 01-Sep-1965, 51 y.o.   MRN: [id number]  Pt awakens to take a shower. Asks Probation officer what time it is. Pt then returns to bed.

## 2016-09-28 NOTE — Progress Notes (Signed)
D: Patient's self inventory sheet: patient has good sleep, received sleep medication.good  Appetite, high energy level, good concentration. Rated depression 0/10, hopeless 0/10, anxiety 0/10. SI/HI/AVH: Denies all. Physical complaints are denied. Goal is "going home today and spending so much valuable time with my daughters and grandbabies" . Plans to work on "continue taking medication as prescribed".   A: Medications administered, assessed medication knowledge and education given on medication regimen.  Emotional support and encouragement given patient. R: Denies SI and HI , contracts for safety. Safety maintained with 15 minute checks.

## 2016-09-28 NOTE — Plan of Care (Signed)
Problem: Activity: Goal: Sleeping patterns will improve Outcome: Progressing Slept 6.75 hours last night according to flowsheet.

## 2016-09-28 NOTE — Progress Notes (Signed)
D: Pt was in the dayroom upon initial approach.  Pt presents with appropriate affect and anxious mood.  Goal is to "continue my medications, I'd love to get home to my 2 daughters and my 2 grand kids."  Pt discussed how he would like to "help other people" after he discharges "by sharing my experiences with them."  Pt denies SI/HI, denies hallucinations, denies pain.  Pt has been visible in milieu interacting with peers and staff appropriately.  Pt attended evening group.    A: Introduced self to pt.  Actively listened to pt and offered support and encouragement. Medication administered per order.  PRN medication administered for anxiety.  Q15 minute safety checks maintained.  R: Pt is safe on the unit.  Pt is compliant with medications.  Pt verbally contracts for safety.  Will continue to monitor and assess.

## 2016-09-28 NOTE — Progress Notes (Signed)
D: Pt was in the dayroom upon initial approach.  Pt presents with appropriate affect and anxious mood.  Describes his day as "great" and goal was to "stayon track, take my medications."  Pt denies SI/HI, denies hallucinations, denies pain.  Pt has been visible in milieu interacting with peers and staff appropriately, smiling at times while interacting with others.  Pt attended evening group.    A: Introduced self to pt.  Actively listened to pt and offered support and encouragement. Medications administered per order.  PRN medication administered for anxiety per request.  Q15 minute safety checks maintained.  R: Pt is safe on the unit.  Pt is compliant with medications.  Pt verbally contracts for safety.  Will continue to monitor and assess.

## 2016-09-28 NOTE — Progress Notes (Signed)
[facility] MD Progress Note  09/28/2016 3:18 PM Frank Moses  MRN:  [id number]  Subjective: Frank Moses reports, "I'm doing well. My mood is good. I'm starting to feel like my old self again"  Objective: Frank Moses is seen, chart reviewed. Discussed this case with the treatment team. Patient is visible on the unit. He is attending group sessions. He is cooperative with staff. He is tolerating his medications. No disruptive behavior on the unit. He denies any adverse effects from his medications. Good eye contact noted. Denies any SIHI, AVH, delusional thoughts or paranoia. He does not appear to be responding to any internal stimuli.  Principal Problem: Schizoaffective disorder, depressive type ([place])  Diagnosis:   Patient Active Problem List   Diagnosis Date Noted  . Schizophrenia ([place]) [F20.9] 09/25/2016  . Special screening for malignant neoplasms, colon [Z12.11] 01/01/2016  . PTSD (post-traumatic stress disorder) [F43.10] 01/09/2015  . Hyperlipidemia [E78.5] 01/09/2015  . MDD (major depressive disorder), recurrent, severe, with psychosis ([place]) [F33.3] 01/08/2015  . Alcohol use disorder, severe, dependence ([place]) [F10.20] 01/08/2015  . Schizoaffective disorder, depressive type ([place]) [F25.1] 01/06/2015  . HTN (hypertension) [I10] 02/13/2014   Total Time spent with patient: 25 minutes  Past Psychiatric History: Schizoaffective disorder, Alcohol use disorder, severe, dependence.  Past Medical History:  Past Medical History:  Diagnosis Date  . Arthritis   . Asthma   . Bipolar 1 disorder ([place])   . Depression   . Gout   . Hypertension   . Peptic ulcer   . Schizophrenia ([place])   . Stroke [facility])     Past Surgical History:  Procedure Laterality Date  . bil foot surgery    . CERVICAL FUSION     Family History:  Family History  Problem Relation Age of Onset  . Alcoholism Father    Family Psychiatric  History: See H&P.  Social History:  History  Alcohol Use  . Yes    Comment: weekly      History  Drug Use No    Social History   Social History  . Marital status: Single    Spouse name: N/A  . Number of children: N/A  . Years of education: N/A   Social History Main Topics  . Smoking status: Current Some Day Smoker    Packs/day: 1.00    Types: Cigarettes  . Smokeless tobacco: Never Used  . Alcohol use Yes     Comment: weekly  . Drug use: No  . Sexual activity: Yes    Birth control/ protection: None   Other Topics Concern  . None   Social History Narrative  . None   Additional Social History:   Sleep: Fair  Appetite:  Good  Current Medications: Current Facility-Administered Medications  Medication Dose Route Frequency Provider Last Rate Last Dose  . acetaminophen (TYLENOL) tablet 650 mg  650 mg Oral Q6H PRN Okonkwo, Justina A, NP      . allopurinol (ZYLOPRIM) tablet 300 mg  300 mg Oral Daily Okonkwo, Justina A, NP   300 mg at 09/28/16 0913  . alum & mag hydroxide-simeth (MAALOX/MYLANTA) 200-200-20 MG/5ML suspension 30 mL  30 mL Oral Q4H PRN Okonkwo, Justina A, NP      . amLODipine (NORVASC) tablet 10 mg  10 mg Oral Daily Okonkwo, Justina A, NP   10 mg at 09/28/16 0914  . atorvastatin (LIPITOR) tablet 10 mg  10 mg Oral Daily Okonkwo, Justina A, NP   10 mg at 09/28/16 0913  . hydrochlorothiazide (HYDRODIURIL) tablet 25  mg  25 mg Oral Daily Okonkwo, Justina A, NP   25 mg at 09/28/16 0913  . hydrOXYzine (ATARAX/VISTARIL) tablet 25 mg  25 mg Oral TID PRN Hughie Closs A, NP   25 mg at 09/27/16 2105  . risperiDONE (RISPERDAL M-TABS) disintegrating tablet 2 mg  2 mg Oral Q8H PRN Lu Duffel, Justina A, NP       And  . LORazepam (ATIVAN) tablet 1 mg  1 mg Oral PRN Okonkwo, Justina A, NP       And  . ziprasidone (GEODON) injection 20 mg  20 mg Intramuscular PRN Okonkwo, Justina A, NP      . magnesium hydroxide (MILK OF MAGNESIA) suspension 30 mL  30 mL Oral Daily PRN Okonkwo, Justina A, NP      . nicotine (NICODERM CQ - dosed in mg/24 hours) patch 21 mg  21 mg  Transdermal Daily PRN Okonkwo, Justina A, NP      . pantoprazole (PROTONIX) EC tablet 40 mg  40 mg Oral Daily Okonkwo, Justina A, NP   40 mg at 09/28/16 0913  . QUEtiapine (SEROQUEL) tablet 100 mg  100 mg Oral QHS Ambrose Finland, MD   100 mg at 09/27/16 2105  . venlafaxine XR (EFFEXOR-XR) 24 hr capsule 150 mg  150 mg Oral Q breakfast Ambrose Finland, MD   150 mg at 09/28/16 9476   Lab Results: No results found for this or any previous visit (from the past 48 hour(s)).  Blood Alcohol level:  Lab Results  Component Value Date   [facility] <5 09/25/2016   ETH <5 54/65/0354   Metabolic Disorder Labs: Lab Results  Component Value Date   HGBA1C 6.0 (H) 01/09/2015   MPG 126 01/09/2015   Lab Results  Component Value Date   PROLACTIN 8.3 01/09/2015   Lab Results  Component Value Date   CHOL 213 (H) 01/09/2015   TRIG 378 (H) 01/09/2015   HDL 36 (L) 01/09/2015   CHOLHDL 5.9 01/09/2015   VLDL 76 (H) 01/09/2015   LDLCALC 101 (H) 01/09/2015   Physical Findings: AIMS: Facial and Oral Movements Muscles of Facial Expression: None, normal Lips and Perioral Area: None, normal Jaw: None, normal Tongue: None, normal,Extremity Movements Upper (arms, wrists, hands, fingers): None, normal Lower (legs, knees, ankles, toes): None, normal, Trunk Movements Neck, shoulders, hips: None, normal, Overall Severity Severity of abnormal movements (highest score from questions above): None, normal Incapacitation due to abnormal movements: None, normal Patient's awareness of abnormal movements (rate only patient's report): No Awareness, Dental Status Current problems with teeth and/or dentures?: No Does patient usually wear dentures?: No  CIWA:    COWS:     Musculoskeletal: Strength & Muscle Tone: within normal limits Gait & Station: normal Patient leans: N/A  Psychiatric Specialty Exam: Physical Exam: Nurses notes & vital signs reviewed.  Review of Systems  Psychiatric/Behavioral:  Positive for depression ("Improving").  :   Blood pressure 125/75, pulse 75, temperature 98.7 F (37.1 C), temperature source Oral, resp. rate 20, height 6\' 2"  (1.88 m), weight 90.3 kg (199 lb).Body mass index is 25.55 kg/m.  General Appearance: Cashual, groomed  Eye Contact:  Good  Speech:  Clear and Coherent  Volume:  Normal  Mood: "Improving"  Affect: Congruent.  Thought Process: Coherent and Goal Directed  Orientation:  Full (Time, Place, and Person)  Thought Content:  Rumination  Suicidal Thoughts:  Yes.  without intent/plan  Homicidal Thoughts:  Yes.  without intent/plan  Memory:  Immediate;   Good Recent;  Fair Remote;   Fair  Judgement:  Impaired  Insight:  Good  Psychomotor Activity:  Increased and Restlessness  Concentration:  Concentration: Fair and Attention Span: Fair  Recall:  Good  Fund of Knowledge:  Good  Language:  Good  Akathisia:  Negative  Handed:  Right  AIMS (if indicated):     Assets:  Communication Skills Desire for Improvement  ADL's:  Intact  Cognition:  WNL  Sleep:  Number of Hours: 4.25     Treatment Plan Summary: Daily contact with patient to assess and evaluate symptoms and progress in treatment.  Will continue today 09/28/16 plan as below except where it is noted. -Continue Seroquel 100 mg Q hs for mood control. -Continue Risperdal disintegrating tabs 2 mg tid prn for agitation/psychosis. -Continue Effexor XR 150 mg daily for depression. -Continue routine PRN for psychosis/agitations. -Continue other medications for other medical issues.  - Continue 15 minutes observation for safety concerns - Encouraged to participate in milieu therapy and group therapy counseling sessions and also work with coping skills -  Develop treatment plan to decrease risks & the need for readmission. -  Psycho-social education regarding self care. - Health care follow up as needed for medical problems. - Restart home medications where appropriate.  Encarnacion Slates, NP, PMHNP, FNP-BC. 09/28/2016, 3:18 PMPatient ID: Frank Moses, male   DOB: Dec 04, 1965, 52 y.o.   MRN: [id number]

## 2016-09-28 NOTE — BHH Group Notes (Signed)
[place] LCSW Group Therapy Note  Date/Time:  09/28/2016  11:00AM-12:00PM  Type of Therapy and Topic:  Group Therapy:  Music and Mood  Participation Level:  Minimal   Description of Group: In this process group, members listened to a variety of genres of music and identified that different types of music evoke different responses.  Patients were encouraged to identify music that was soothing for them and music that was energizing for them.  Patients discussed how this knowledge can help with wellness and recovery in various ways including managing depression and anxiety as well as encouraging healthy sleep habits.    Therapeutic Goals: 1. Patients will explore the impact of different varieties of music on mood 2. Patients will verbalize the thoughts they have when listening to different types of music 3. Patients will identify music that is soothing to them as well as music that is energizing to them 4. Patients will discuss how to use this knowledge to assist in maintaining wellness and recovery 5. Patients will explore the use of music as a coping skill  Summary of Patient Progress:  At the beginning of group, patient was not present.  He arrived for the 3rd or 4th song, and had no expression on his face so it could not be ascertained whether he enjoyed it or not.  At the end of group he stated he felt "great" but his affect did not match that statement.  He stated most of the music made him feel nothing.  Therapeutic Modalities: Solution Focused Brief Therapy Motivational Interviewing Activity   Selmer Dominion, LCSW 09/28/2016 2:32 PM

## 2016-09-28 NOTE — Progress Notes (Signed)
Patient attended group and said that her day was a 10.  Her coping skills for today were eating and socializing.

## 2016-09-28 NOTE — Plan of Care (Signed)
Problem: Health Behavior/Discharge Planning: Goal: Compliance with prescribed medication regimen will improve Outcome: Progressing Pt was compliant with HS medication tonight.

## 2016-09-29 DIAGNOSIS — R454 Irritability and anger: Secondary | ICD-10-CM

## 2016-09-29 MED ORDER — QUETIAPINE FUMARATE 100 MG PO TABS: 100.0000 mg | ORAL_TABLET | Freq: Every day | ORAL | 0 refills | Status: DC

## 2016-09-29 MED ORDER — VENLAFAXINE HCL ER 150 MG PO CP24: 150.0000 mg | ORAL_CAPSULE | Freq: Every day | ORAL | 0 refills | Status: DC

## 2016-09-29 MED ORDER — NICOTINE 21 MG/24HR TD PT24: 21.0000 mg | MEDICATED_PATCH | Freq: Every day | TRANSDERMAL | 0 refills | Status: DC | PRN

## 2016-09-29 MED ORDER — HYDROXYZINE HCL 25 MG PO TABS: 25.0000 mg | ORAL_TABLET | Freq: Three times a day (TID) | ORAL | 0 refills | Status: DC | PRN

## 2016-09-29 NOTE — Plan of Care (Signed)
Problem: [facility] Participation in Recreation Therapeutic Interventions Goal: STG-Patient will identify at least five coping skills for ** STG: Coping Skills - Patient will be able to identify at least 5 coping skills for anger by conclusion of recreation therapy tx  Outcome: Completed/Met Date Met: 09/29/16 Pt was able to identify coping skills at the completion of coping skills recreation therapy session.   Victorino Sparrow, LRT/CTRS
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
## 2016-09-29 NOTE — Progress Notes (Signed)
Pt discharged home with his mom. Pt was ambulatory, stable and appreciative at that time. All papers and prescriptions were given and valuables returned. Verbal understanding expressed. Denies SI/HI and A/VH. Pt given opportunity to express concerns and ask questions.

## 2016-09-29 NOTE — Progress Notes (Signed)
Recreation Therapy Notes  Date: 09/29/16 Time: 1000 Location: 500 Hall Dayroom  Group Topic: Coping Skills  Goal Area(s) Addresses:  Patient will be able to identify positive coping skills. Patient will be able to identify benefits of using positive coping skills post d/c.  Behavioral Response: Engaged  Intervention: Environmental consultant, pencils  Activity: Building surveyor.  Patients were given a blank Building surveyor.  Patients were to imagine themselves being stuck inside the spider web.  Patients were to then label the things that have kept them "stuck" within the web.  Once patient identified those things, they were to come up with at least three coping skills for the situations they identified.  Education: Radiographer, therapeutic, Dentist.   Education Outcome: Acknowledges understanding/In group clarification offered/Needs additional education.   Clinical Observations/Feedback: Pt stated he had been feeling depressed and at the end of his rope.  Pt stated when he has those feelings he talks to and spends time with his children.  Pt also stated cooking is a major coping skill for him.  Pt stated using his coping skills will help make him a better person and keep him on his medication.   Victorino Sparrow, LRT/CTRS        Victorino Sparrow A 09/29/2016 11:51 AM

## 2016-09-29 NOTE — Progress Notes (Signed)
  [facility] Adult Case Management Discharge Plan :  Will you be returning to the same living situation after discharge:  Yes,  home At discharge, do you have transportation home?: Yes,  family member/friend Do you have the ability to pay for your medications: Yes,  [employer] Medicare  Release of information consent forms completed and submitted to medical records by CSW.  Patient to Follow up at: [facility] up.   Why:  Waiting to hear back regarding appointment time and date.  Agency will call back on Tuesday to schedule appointment. Social worker will contact you with this information on Tuesday. Thank you.  Contact information: [address] [place] [address] [phone]           Next level of care provider has access to [place] and Suicide Prevention discussed: Yes,  SPE completed with pt; pt declined to consent to family contact. SPI pamphlet and Mobile Crisis information provided.  Have you used any form of tobacco in the last 30 days? (Cigarettes, Smokeless Tobacco, Cigars, and/or Pipes): Yes  Has patient been referred to the Quitline?: Patient refused referral  Patient has been referred for addiction treatment: Yes  [employer], LCSW 09/29/2016, 9:38 AM

## 2016-09-29 NOTE — Progress Notes (Signed)
Recreation Therapy Notes  INPATIENT RECREATION TR PLAN  Patient Details Name: Frank Moses MRN: [id number] DOB: [birth year] Today's Date: 09/29/2016  Rec Therapy Plan Is patient appropriate for Therapeutic Recreation?: Yes Treatment times per week: about 3 days Estimated Length of Stay: 5-7 days TR Treatment/Interventions: Group participation (Comment)  Discharge Criteria Pt will be discharged from therapy if:: Discharged Treatment plan/goals/alternatives discussed and agreed upon by:: Patient/family  Discharge Summary Short term goals set: Patient will be able to identify at least 5 coping skills for anger by conclusion of recreation therapy tx  Short term goals met: Complete Progress toward goals comments: Groups attended Which groups?: Coping skills, Leisure education Reason goals not met: None Therapeutic equipment acquired: N/A Reason patient discharged from therapy: Discharge from hospital Pt/family agrees with progress & goals achieved: Yes Date patient discharged from therapy: 09/29/16   Victorino Sparrow, LRT/CTRS   Ria Comment, [place] 09/29/2016, 12:37 PM

## 2017-04-09 ENCOUNTER — Encounter (INDEPENDENT_AMBULATORY_CARE_PROVIDER_SITE_OTHER): Payer: Self-pay | Admitting: *Deleted

## 2017-07-01 ENCOUNTER — Other Ambulatory Visit: Payer: Self-pay

## 2017-07-01 ENCOUNTER — Emergency Department (HOSPITAL_COMMUNITY)
Admission: EM | Admit: 2017-07-01 | Discharge: 2017-07-01 | Disposition: A | Discharge status: Home / Self Care | Payer: Medicare HMO | Attending: Emergency Medicine | Admitting: Emergency Medicine

## 2017-07-01 ENCOUNTER — Emergency Department (HOSPITAL_COMMUNITY): Payer: Medicare HMO

## 2017-07-01 ENCOUNTER — Encounter (HOSPITAL_COMMUNITY): Payer: Self-pay | Admitting: *Deleted

## 2017-07-01 DIAGNOSIS — S0990XA Unspecified injury of head, initial encounter: Secondary | ICD-10-CM

## 2017-07-01 DIAGNOSIS — Z79899 Other long term (current) drug therapy: Secondary | ICD-10-CM | POA: Insufficient documentation

## 2017-07-01 DIAGNOSIS — Y939 Activity, unspecified: Secondary | ICD-10-CM | POA: Diagnosis not present

## 2017-07-01 DIAGNOSIS — F17210 Nicotine dependence, cigarettes, uncomplicated: Secondary | ICD-10-CM | POA: Insufficient documentation

## 2017-07-01 DIAGNOSIS — S0083XA Contusion of other part of head, initial encounter: Secondary | ICD-10-CM | POA: Diagnosis not present

## 2017-07-01 DIAGNOSIS — J45909 Unspecified asthma, uncomplicated: Secondary | ICD-10-CM | POA: Diagnosis not present

## 2017-07-01 DIAGNOSIS — I10 Essential (primary) hypertension: Secondary | ICD-10-CM | POA: Insufficient documentation

## 2017-07-01 DIAGNOSIS — Y929 Unspecified place or not applicable: Secondary | ICD-10-CM | POA: Diagnosis not present

## 2017-07-01 DIAGNOSIS — Y999 Unspecified external cause status: Secondary | ICD-10-CM | POA: Insufficient documentation

## 2017-07-01 DIAGNOSIS — M25512 Pain in left shoulder: Secondary | ICD-10-CM | POA: Diagnosis not present

## 2017-07-01 MED ORDER — HYDROCODONE-ACETAMINOPHEN 5-325 MG PO TABS
1.0000 | ORAL_TABLET | Freq: Once | ORAL | Status: AC
  Administered 2017-07-01: 1 via ORAL
  Filled 2017-07-01: qty 1

## 2017-07-01 MED ORDER — HYDROCODONE-ACETAMINOPHEN 5-325 MG PO TABS: 1.0000 | ORAL_TABLET | Freq: Four times a day (QID) | ORAL | 0 refills | Status: DC | PRN
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
## 2017-07-01 NOTE — Discharge Instructions (Addendum)
CT head neck and face without any significant injuries other than a lot of bruising and swelling.  X-ray of left shoulder without any bony injuries.  Take the hydrocodone as directed.  Follow-up if not improving over the next 7 days.  Return for any new or worse symptoms.

## 2018-03-25 ENCOUNTER — Encounter (INDEPENDENT_AMBULATORY_CARE_PROVIDER_SITE_OTHER): Payer: Self-pay | Admitting: *Deleted

## 2018-07-02 ENCOUNTER — Emergency Department (HOSPITAL_COMMUNITY): Payer: Medicare Other

## 2018-07-02 ENCOUNTER — Encounter (HOSPITAL_COMMUNITY): Payer: Self-pay | Admitting: *Deleted

## 2018-07-02 ENCOUNTER — Other Ambulatory Visit: Payer: Self-pay

## 2018-07-02 ENCOUNTER — Emergency Department (HOSPITAL_COMMUNITY)
Admission: EM | Admit: 2018-07-02 | Discharge: 2018-07-02 | Disposition: A | Discharge status: Home / Self Care | Payer: Medicare Other | Attending: Emergency Medicine | Admitting: Emergency Medicine

## 2018-07-02 DIAGNOSIS — R51 Headache: Secondary | ICD-10-CM | POA: Diagnosis not present

## 2018-07-02 DIAGNOSIS — M25512 Pain in left shoulder: Secondary | ICD-10-CM | POA: Insufficient documentation

## 2018-07-02 DIAGNOSIS — E119 Type 2 diabetes mellitus without complications: Secondary | ICD-10-CM | POA: Diagnosis not present

## 2018-07-02 DIAGNOSIS — S0003XA Contusion of scalp, initial encounter: Secondary | ICD-10-CM | POA: Diagnosis not present

## 2018-07-02 DIAGNOSIS — S40212A Abrasion of left shoulder, initial encounter: Secondary | ICD-10-CM | POA: Diagnosis not present

## 2018-07-02 DIAGNOSIS — Y998 Other external cause status: Secondary | ICD-10-CM | POA: Diagnosis not present

## 2018-07-02 DIAGNOSIS — Z79899 Other long term (current) drug therapy: Secondary | ICD-10-CM | POA: Insufficient documentation

## 2018-07-02 DIAGNOSIS — S0081XA Abrasion of other part of head, initial encounter: Secondary | ICD-10-CM | POA: Insufficient documentation

## 2018-07-02 DIAGNOSIS — R52 Pain, unspecified: Secondary | ICD-10-CM | POA: Diagnosis not present

## 2018-07-02 DIAGNOSIS — T07XXXA Unspecified multiple injuries, initial encounter: Secondary | ICD-10-CM

## 2018-07-02 DIAGNOSIS — S3993XA Unspecified injury of pelvis, initial encounter: Secondary | ICD-10-CM | POA: Diagnosis not present

## 2018-07-02 DIAGNOSIS — S199XXA Unspecified injury of neck, initial encounter: Secondary | ICD-10-CM | POA: Diagnosis not present

## 2018-07-02 DIAGNOSIS — Z23 Encounter for immunization: Secondary | ICD-10-CM | POA: Diagnosis not present

## 2018-07-02 DIAGNOSIS — S0990XA Unspecified injury of head, initial encounter: Secondary | ICD-10-CM | POA: Diagnosis not present

## 2018-07-02 DIAGNOSIS — I10 Essential (primary) hypertension: Secondary | ICD-10-CM | POA: Insufficient documentation

## 2018-07-02 DIAGNOSIS — S50312A Abrasion of left elbow, initial encounter: Secondary | ICD-10-CM | POA: Diagnosis not present

## 2018-07-02 DIAGNOSIS — Y9355 Activity, bike riding: Secondary | ICD-10-CM | POA: Insufficient documentation

## 2018-07-02 DIAGNOSIS — Y929 Unspecified place or not applicable: Secondary | ICD-10-CM | POA: Diagnosis not present

## 2018-07-02 DIAGNOSIS — R0902 Hypoxemia: Secondary | ICD-10-CM | POA: Diagnosis not present

## 2018-07-02 DIAGNOSIS — M542 Cervicalgia: Secondary | ICD-10-CM | POA: Diagnosis not present

## 2018-07-02 DIAGNOSIS — S299XXA Unspecified injury of thorax, initial encounter: Secondary | ICD-10-CM | POA: Diagnosis not present

## 2018-07-02 DIAGNOSIS — F17200 Nicotine dependence, unspecified, uncomplicated: Secondary | ICD-10-CM | POA: Insufficient documentation

## 2018-07-02 DIAGNOSIS — R0689 Other abnormalities of breathing: Secondary | ICD-10-CM | POA: Diagnosis not present

## 2018-07-02 DIAGNOSIS — R58 Hemorrhage, not elsewhere classified: Secondary | ICD-10-CM | POA: Diagnosis not present

## 2018-07-02 HISTORY — DX: Bipolar disorder, unspecified: F31.9

## 2018-07-02 LAB — CBC WITH DIFFERENTIAL/PLATELET
Abs Immature Granulocytes: 0.07 10*3/uL (ref 0.00–0.07)
Basophils Absolute: 0.1 10*3/uL (ref 0.0–0.1)
Basophils Relative: 1 %
Eosinophils Absolute: 0.4 10*3/uL (ref 0.0–0.5)
Eosinophils Relative: 4 %
HCT: 42.5 % (ref 39.0–52.0)
Hemoglobin: 15.2 g/dL (ref 13.0–17.0)
Immature Granulocytes: 1 %
Lymphocytes Relative: 36 %
Lymphs Abs: 3.1 10*3/uL (ref 0.7–4.0)
MCH: 31.9 pg (ref 26.0–34.0)
MCHC: 35.8 g/dL (ref 30.0–36.0)
MCV: 89.1 fL (ref 80.0–100.0)
Monocytes Absolute: 0.6 10*3/uL (ref 0.1–1.0)
Monocytes Relative: 7 %
Neutro Abs: 4.3 10*3/uL (ref 1.7–7.7)
Neutrophils Relative %: 51 %
Platelets: 216 10*3/uL (ref 150–400)
RBC: 4.77 MIL/uL (ref 4.22–5.81)
RDW: 12.1 % (ref 11.5–15.5)
WBC: 8.4 10*3/uL (ref 4.0–10.5)
nRBC: 0 % (ref 0.0–0.2)

## 2018-07-02 LAB — COMPREHENSIVE METABOLIC PANEL
ALT: 36 U/L (ref 0–44)
AST: 36 U/L (ref 15–41)
Albumin: 3.9 g/dL (ref 3.5–5.0)
Alkaline Phosphatase: 99 U/L (ref 38–126)
Anion gap: 10 (ref 5–15)
BUN: 7 mg/dL (ref 6–20)
CO2: 25 mmol/L (ref 22–32)
Calcium: 9 mg/dL (ref 8.9–10.3)
Chloride: 103 mmol/L (ref 98–111)
Creatinine, Ser: 0.84 mg/dL (ref 0.61–1.24)
GFR calc Af Amer: >60 mL/min (ref >60)
GFR calc non Af Amer: >60 mL/min (ref >60)
Glucose, Bld: 119 mg/dL — ABNORMAL HIGH (ref 70–99)
Potassium: 3.1 mmol/L — ABNORMAL LOW (ref 3.5–5.1)
Sodium: 138 mmol/L (ref 135–145)
Total Bilirubin: 0.4 mg/dL (ref 0.3–1.2)
Total Protein: 6.9 g/dL (ref 6.5–8.1)

## 2018-07-02 LAB — ETHANOL: Alcohol, Ethyl (B): 186 mg/dL — ABNORMAL HIGH (ref <10)

## 2018-07-02 MED ORDER — BACITRACIN ZINC 500 UNIT/GM EX OINT
1.0000 "application " | TOPICAL_OINTMENT | Freq: Two times a day (BID) | CUTANEOUS | Status: DC
  Administered 2018-07-02: 1 via TOPICAL
  Filled 2018-07-02: qty 0.9

## 2018-07-02 MED ORDER — TETANUS-DIPHTH-ACELL PERTUSSIS 5-2.5-18.5 LF-MCG/0.5 IM SUSP
0.5000 mL | Freq: Once | INTRAMUSCULAR | Status: AC
  Administered 2018-07-02: 23:00:00 0.5 mL via INTRAMUSCULAR
  Filled 2018-07-02: qty 0.5

## 2018-07-02 MED ORDER — KETOROLAC TROMETHAMINE 30 MG/ML IJ SOLN
30.0000 mg | Freq: Once | INTRAMUSCULAR | Status: AC
  Administered 2018-07-02: 30 mg via INTRAVENOUS
  Filled 2018-07-02: qty 1

## 2018-07-02 NOTE — ED Notes (Signed)
[facility]- [place], [phone]

## 2018-07-02 NOTE — Consult Note (Signed)
Responded to Lvl 2 page. Pt unavailable, no family was present, staff will page again if further need for chaplain services.  Rev. Eloise Levels Chaplain
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
## 2018-07-02 NOTE — ED Notes (Signed)
Pt attempting to find a ride home.

## 2018-07-05 ENCOUNTER — Encounter (HOSPITAL_COMMUNITY): Payer: Self-pay | Admitting: *Deleted

## 2018-08-10 ENCOUNTER — Ambulatory Visit: Payer: Self-pay | Admitting: Family Medicine

## 2018-08-20 ENCOUNTER — Telehealth: Payer: Self-pay | Admitting: General Practice

## 2018-08-20 ENCOUNTER — Other Ambulatory Visit: Payer: Self-pay

## 2018-08-20 NOTE — Telephone Encounter (Signed)
Pt cleared to come in for appt

## 2018-08-23 ENCOUNTER — Other Ambulatory Visit: Payer: Self-pay

## 2018-08-23 ENCOUNTER — Ambulatory Visit (INDEPENDENT_AMBULATORY_CARE_PROVIDER_SITE_OTHER): Payer: Medicare Other | Admitting: Family Medicine

## 2018-08-23 ENCOUNTER — Encounter: Payer: Self-pay | Admitting: Family Medicine

## 2018-08-23 VITALS — BP 145/84 | HR 82 | Temp 98.0°F | Ht 74.0 in | Wt 199.0 lb

## 2018-08-23 DIAGNOSIS — E119 Type 2 diabetes mellitus without complications: Secondary | ICD-10-CM | POA: Insufficient documentation

## 2018-08-23 DIAGNOSIS — I10 Essential (primary) hypertension: Secondary | ICD-10-CM

## 2018-08-23 DIAGNOSIS — E1169 Type 2 diabetes mellitus with other specified complication: Secondary | ICD-10-CM

## 2018-08-23 DIAGNOSIS — M1A0720 Idiopathic chronic gout, left ankle and foot, without tophus (tophi): Secondary | ICD-10-CM | POA: Diagnosis not present

## 2018-08-23 DIAGNOSIS — E785 Hyperlipidemia, unspecified: Secondary | ICD-10-CM

## 2018-08-23 DIAGNOSIS — Z7689 Persons encountering health services in other specified circumstances: Secondary | ICD-10-CM | POA: Diagnosis not present

## 2018-08-23 DIAGNOSIS — E1159 Type 2 diabetes mellitus with other circulatory complications: Secondary | ICD-10-CM

## 2018-08-23 DIAGNOSIS — R7309 Other abnormal glucose: Secondary | ICD-10-CM | POA: Diagnosis not present

## 2018-08-23 DIAGNOSIS — Z114 Encounter for screening for human immunodeficiency virus [HIV]: Secondary | ICD-10-CM

## 2018-08-23 DIAGNOSIS — R748 Abnormal levels of other serum enzymes: Secondary | ICD-10-CM

## 2018-08-23 DIAGNOSIS — M109 Gout, unspecified: Secondary | ICD-10-CM | POA: Insufficient documentation

## 2018-08-23 DIAGNOSIS — E7800 Pure hypercholesterolemia, unspecified: Secondary | ICD-10-CM | POA: Diagnosis not present

## 2018-08-23 DIAGNOSIS — K219 Gastro-esophageal reflux disease without esophagitis: Secondary | ICD-10-CM

## 2018-08-23 LAB — BAYER DCA HB A1C WAIVED: HB A1C (BAYER DCA - WAIVED): 7.1 % — ABNORMAL HIGH (ref <7.0)

## 2018-08-23 MED ORDER — BLOOD GLUCOSE METER KIT: PACK | 0 refills | Status: AC

## 2018-08-23 MED ORDER — METFORMIN HCL 500 MG PO TABS: 500.0000 mg | ORAL_TABLET | Freq: Every day | ORAL | 3 refills | Status: DC

## 2018-08-23 MED ORDER — AMLODIPINE BESYLATE 10 MG PO TABS: 10.0000 mg | ORAL_TABLET | Freq: Every day | ORAL | 1 refills | Status: DC

## 2018-08-23 MED ORDER — PANTOPRAZOLE SODIUM 40 MG PO TBEC: 40.0000 mg | DELAYED_RELEASE_TABLET | Freq: Every day | ORAL | 1 refills | Status: DC

## 2018-08-23 MED ORDER — ALLOPURINOL 300 MG PO TABS
300.0000 mg | ORAL_TABLET | Freq: Every day | ORAL | 1 refills | Status: DC
Start: 2018-08-23 — End: 2018-12-27

## 2018-08-23 MED ORDER — ATORVASTATIN CALCIUM 10 MG PO TABS
10.0000 mg | ORAL_TABLET | Freq: Every day | ORAL | 1 refills | Status: DC
Start: 2018-08-23 — End: 2018-08-25

## 2018-08-23 MED ORDER — HYDROCHLOROTHIAZIDE 25 MG PO TABS: 25.0000 mg | ORAL_TABLET | Freq: Every day | ORAL | 1 refills | Status: DC

## 2018-08-23 NOTE — Patient Instructions (Addendum)
Type 2 Diabetes Mellitus, Diagnosis, Adult Type 2 diabetes (type 2 diabetes mellitus) is a long-term (chronic) disease. It may be caused by one or both of these problems:  Your pancreas does not make enough of a hormone called insulin.  Your body does not react in a normal way to insulin that it makes. Insulin lets sugars (glucose) go into cells in your body. This gives you energy. If you have type 2 diabetes, sugars cannot get into cells. This causes high blood sugar (hyperglycemia). Your doctor will set treatment goals for you. Generally, you should have these blood sugar levels:  Before meals (preprandial): 80-130 mg/dL (4.4-7.2 mmol/L).  After meals (postprandial): below 180 mg/dL (10 mmol/L).  A1c (hemoglobin A1c) level: less than 7%. Follow these instructions at home: Questions to ask your doctor  You may want to ask these questions: ? Do I need to meet with a diabetes educator? ? Where can I find a support group for people with diabetes? ? What equipment will I need to care for myself at home? ? What diabetes medicines do I need? When should I take them? ? How often do I need to check my blood sugar? ? What number can I call if I have questions? ? When is my next doctor's visit? General instructions  Take over-the-counter and prescription medicines only as told by your doctor.  Keep all follow-up visits as told by your doctor. This is important. Contact a doctor if:  Your blood sugar is at or above 240 mg/dL (13.3 mmol/L) for 2 days in a row.  You have been sick for 2 days or more, and you are not getting better.  You have had a fever for 2 days or more, and you are not getting better.  You have any of these problems for more than 6 hours: ? You cannot eat or drink. ? You feel sick to your stomach (nauseous). ? You throw up (vomit). ? You have watery poop (diarrhea). Get help right away if:  Your blood sugar is lower than 54 mg/dL (3 mmol/L).  You get  confused.  You have trouble: ? Thinking clearly. ? Breathing.  You have moderate or large ketone levels in your pee (urine). Summary  Type 2 diabetes is a long-term (chronic) disease. Your pancreas may not make enough of a hormone called insulin, or your body may not react normally to insulin that it makes.  Take over-the-counter and prescription medicines only as told by your doctor.  Keep all follow-up visits as told by your doctor. This is important. This information is not intended to replace advice given to you by your health care provider. Make sure you discuss any questions you have with your health care provider. Document Released: 10/23/2007 Document Revised: 03/13/2017 Document Reviewed: 02/16/2015 Elsevier Patient Education  2020 [employer].

## 2018-08-23 NOTE — Progress Notes (Signed)
Subjective:  Patient ID: Frank Moses, male    DOB: 1965-07-28, 53 y.o.   MRN: [id number]  Patient Care Team: Baruch Gouty, FNP as PCP - General (Family Medicine) Dione Housekeeper, MD (Family Medicine)   Chief Complaint:  Establish Care [facility]), Medical Management of Chronic Issues, Hyperlipidemia, and Hypertension   HPI: Frank Moses is a 53 y.o. male presenting on 08/23/2018 for Establish Care ([facility]), Medical Management of Chronic Issues, Hyperlipidemia, and Hypertension  Pt presents today to establish care. Pt was previously seeing Dr. Edrick Oh who has retired. Pt states he has a history of HTN, high cholesterol, chronic gout, carpal tunnel syndrome, GERD, PTSD, and depression. Pt states he is followed by [facility] for his PTSD and depression. States he sees a doctor at [facility] for his carpal tunnel syndrome. He reports his symptoms have been well controlled lately. Pt states his blood pressure is usually well controlled. States he walked to the clinic today so his blood pressure may be elevated today. Pt denies chest pain, leg swelling, shortness of breath, visual disturbances, or headaches. He is compliant with medications without associated side effects. He has high cholesterol and is on atorvastatin daily. He denies myalgias or arthralgias. He has chronic gout. He takes allopurinol daily to prevent attacks. Last attack several months ago. Usually has symptoms in left great toe. GERD is well controlled with Protonix. No sore throat, dysphagia, voice change, cough, hemoptysis, melena, or hemoptysis.  When reviewing labs from previous visits it was noted the pt has an A1C above 6 at least twice. Pt states he does not have a diagnosis of diabetes and has never been on medications for this. He denies polydipsia or polyphagia. He does complain of polyuria.    Relevant past medical, surgical, family, and social history reviewed and updated as indicated.  Allergies and medications reviewed and  updated. Date reviewed: Chart in Epic.   Past Medical History:  Diagnosis Date  . Arthritis   . Asthma   . Bipolar 1 disorder ([place])   . Bipolar affective ([place])   . Depression   . Gout   . Hyperlipidemia   . Hypertension   . Peptic ulcer   . Schizophrenia ([place])   . Stroke [facility])     Past Surgical History:  Procedure Laterality Date  . bil foot surgery    . CERVICAL FUSION    . FOOT SURGERY    . HIP SURGERY Left   . NECK SURGERY     cyst removed from neck     Social History   Socioeconomic History  . Marital status: Single    Spouse name: Not on file  . Number of children: Not on file  . Years of education: Not on file  . Highest education level: Not on file  Occupational History  . Not on file  Social Needs  . Financial resource strain: Not on file  . Food insecurity    Worry: Not on file    Inability: Not on file  . Transportation needs    Medical: Not on file    Non-medical: Not on file  Tobacco Use  . Smoking status: Current Every Day Smoker  . Smokeless tobacco: Never Used  Substance and Sexual Activity  . Alcohol use: Yes    Comment: 3-6 cans of beer every 3-4 days  . Drug use: Never  . Sexual activity: Yes    Birth control/protection: None  Lifestyle  . Physical activity  Days per week: Not on file    Minutes per session: Not on file  . Stress: Not on file  Relationships  . Social Herbalist on phone: Not on file    Gets together: Not on file    Attends religious service: Not on file    Active member of club or organization: Not on file    Attends meetings of clubs or organizations: Not on file    Relationship status: Not on file  . Intimate partner violence    Fear of current or ex partner: Not on file    Emotionally abused: Not on file    Physically abused: Not on file    Forced sexual activity: Not on file  Other Topics Concern  . Not on file  Social History Narrative   ** Merged History Encounter **        Outpatient  Encounter Medications as of 08/23/2018  Medication Sig  . allopurinol (ZYLOPRIM) 300 MG tablet Take 1 tablet (300 mg total) by mouth daily.  Marland Kitchen amLODipine (NORVASC) 10 MG tablet Take 1 tablet (10 mg total) by mouth daily.  . Aspirin-Salicylamide-Caffeine (BC HEADACHE POWDER PO) Take 1 packet by mouth as needed (for headaches).  Marland Kitchen atorvastatin (LIPITOR) 10 MG tablet Take 1 tablet (10 mg total) by mouth daily.  . diphenhydramine-acetaminophen (TYLENOL PM) 25-500 MG TABS tablet Take 1-2 tablets by mouth at bedtime as needed (for sleep).  . EFFEXOR XR 75 MG 24 hr capsule Take 225 mg by mouth every morning.  . hydrochlorothiazide (HYDRODIURIL) 25 MG tablet Take 1 tablet (25 mg total) by mouth daily. For high blood pressure  . pantoprazole (PROTONIX) 40 MG tablet Take 1 tablet (40 mg total) by mouth daily. For acid reflux  . [DISCONTINUED] allopurinol (ZYLOPRIM) 300 MG tablet Take 300 mg by mouth daily.   . [DISCONTINUED] amLODipine (NORVASC) 10 MG tablet Take 10 mg by mouth daily.  . [DISCONTINUED] atorvastatin (LIPITOR) 10 MG tablet Take 10 mg by mouth daily.  . [DISCONTINUED] hydrochlorothiazide (HYDRODIURIL) 25 MG tablet Take 1 tablet (25 mg total) by mouth daily. For high blood pressure  . [DISCONTINUED] pantoprazole (PROTONIX) 40 MG tablet Take 1 tablet (40 mg total) by mouth daily. For acid reflux  . blood glucose meter kit and supplies Dispense based on patient and insurance preference. Use up to four times daily as directed. (FOR ICD-10 E10.9, E11.9).  . metFORMIN (GLUCOPHAGE) 500 MG tablet Take 1 tablet (500 mg total) by mouth daily with breakfast.  . [DISCONTINUED] allopurinol (ZYLOPRIM) 300 MG tablet Take 300 mg by mouth daily.  . [DISCONTINUED] amLODipine (NORVASC) 10 MG tablet Take 10 mg by mouth daily.  . [DISCONTINUED] hydrochlorothiazide (HYDRODIURIL) 25 MG tablet Take 25 mg by mouth daily.   . [DISCONTINUED] HYDROcodone-acetaminophen (NORCO/VICODIN) 5-325 MG tablet Take 1-2 tablets by  mouth every 6 (six) hours as needed.  . [DISCONTINUED] naproxen sodium (ALEVE) 220 MG tablet Take 220-440 mg by mouth 2 (two) times daily as needed (for pain or headaches).  . [DISCONTINUED] PROTONIX 40 MG tablet Take 40 mg by mouth daily before breakfast.  . [DISCONTINUED] venlafaxine XR (EFFEXOR-XR) 150 MG 24 hr capsule Take 1 capsule (150 mg total) by mouth daily with breakfast.   No facility-administered encounter medications on file as of 08/23/2018.     Allergies  Allergen Reactions  . Olanzapine Other (See Comments) and Itching    "Didn't agree with me"  . Orange Oil Other (See Comments)  Allergic to oranges -- Heartburn/indigestion  . Other     Steak - triggers gout flares   . Shrimp [Shellfish Allergy] Other (See Comments)    Triggers gout flare  . Tomato Other (See Comments)    Heartburn/indigestion  . Vicodin [Hydrocodone-Acetaminophen] Other (See Comments)    "Makes me sweat"  . Orange Fruit [Citrus]     Boil-like spots on skin  . Tomato Rash and Other (See Comments)    REACTION: Boil-like spots on skin    Review of Systems  Constitutional: Negative for activity change, appetite change, chills, diaphoresis, fatigue, fever and unexpected weight change.  HENT: Negative.  Negative for sore throat, trouble swallowing and voice change.   Eyes: Negative.  Negative for photophobia and visual disturbance.  Respiratory: Negative for cough, chest tightness and shortness of breath.   Cardiovascular: Negative for chest pain, palpitations and leg swelling.  Gastrointestinal: Negative for abdominal distention, abdominal pain, anal bleeding, blood in stool, constipation, diarrhea, nausea, rectal pain and vomiting.  Endocrine: Positive for polyuria. Negative for cold intolerance, heat intolerance, polydipsia and polyphagia.  Genitourinary: Negative for dysuria, frequency and urgency.  Musculoskeletal: Negative for arthralgias and myalgias.  Skin: Negative.   Allergic/Immunologic:  Negative.   Neurological: Negative for dizziness, tremors, seizures, syncope, facial asymmetry, speech difficulty, weakness, light-headedness, numbness and headaches.  Hematological: Negative.  Does not bruise/bleed easily.  Psychiatric/Behavioral: Negative for agitation, confusion, hallucinations, sleep disturbance and suicidal ideas.  All other systems reviewed and are negative.       Objective:  BP (!) 145/84   Pulse 82   Temp 98 F (36.7 C)   Ht _0  (1.88 m)   Wt 199 lb (90.3 kg)   BMI 25.55 kg/m    Wt Readings from Last 3 Encounters:  08/23/18 199 lb (90.3 kg)  07/02/18 197 lb (89.4 kg)  07/01/17 191 lb (86.6 kg)    Physical Exam Vitals signs and nursing note reviewed.  Constitutional:      General: He is not in acute distress.    Appearance: Normal appearance. He is well-developed and well-groomed. He is not ill-appearing, toxic-appearing or diaphoretic.  HENT:     Head: Normocephalic and atraumatic.     Jaw: There is normal jaw occlusion.     Right Ear: Hearing, tympanic membrane, ear canal and external ear normal.     Left Ear: Hearing, tympanic membrane, ear canal and external ear normal.     Nose: Nose normal.     Mouth/Throat:     Lips: Pink.     Mouth: Mucous membranes are moist.     Pharynx: Oropharynx is clear. Uvula midline.  Eyes:     General: Lids are normal.     Extraocular Movements: Extraocular movements intact.     Conjunctiva/sclera: Conjunctivae normal.     Pupils: Pupils are equal, round, and reactive to light.  Neck:     Musculoskeletal: Normal range of motion and neck supple.     Thyroid: No thyroid mass, thyromegaly or thyroid tenderness.     Vascular: No carotid bruit or JVD.     Trachea: Trachea and phonation normal.  Cardiovascular:     Rate and Rhythm: Normal rate and regular rhythm.     Chest Wall: PMI is not displaced.     Pulses: Normal pulses.     Heart sounds: Normal heart sounds. No murmur. No friction rub. No gallop.    Pulmonary:     Effort: Pulmonary effort is normal. No respiratory distress.  Breath sounds: Normal breath sounds. No wheezing.  Abdominal:     General: Bowel sounds are normal. There is no distension or abdominal bruit.     Palpations: Abdomen is soft. There is no hepatomegaly or splenomegaly.     Tenderness: There is no abdominal tenderness. There is no right CVA tenderness or left CVA tenderness.     Hernia: No hernia is present.  Musculoskeletal: Normal range of motion.     Right lower leg: No edema.     Left lower leg: No edema.  Lymphadenopathy:     Cervical: No cervical adenopathy.  Skin:    General: Skin is warm and dry.     Capillary Refill: Capillary refill takes less than 2 seconds.     Coloration: Skin is not cyanotic, jaundiced or pale.     Findings: No rash.  Neurological:     General: No focal deficit present.     Mental Status: He is alert and oriented to person, place, and time.     Cranial Nerves: Cranial nerves are intact.     Sensory: Sensation is intact.     Motor: Motor function is intact.     Coordination: Coordination is intact.     Gait: Gait is intact.     Deep Tendon Reflexes: Reflexes are normal and symmetric.  Psychiatric:        Attention and Perception: Attention and perception normal.        Mood and Affect: Mood and affect normal.        Speech: Speech normal.        Behavior: Behavior normal. Behavior is cooperative.        Thought Content: Thought content normal.        Cognition and Memory: Cognition and memory normal.        Judgment: Judgment normal.     Results for orders placed or performed during the hospital encounter of 07/02/18  Comprehensive metabolic panel  Result Value Ref Range   Sodium 138 135 - 145 mmol/L   Potassium 3.1 (L) 3.5 - 5.1 mmol/L   Chloride 103 98 - 111 mmol/L   CO2 25 22 - 32 mmol/L   Glucose, Bld 119 (H) 70 - 99 mg/dL   BUN 7 6 - 20 mg/dL   Creatinine, Ser 0.84 0.61 - 1.24 mg/dL   Calcium 9.0 8.9 - 10.3  mg/dL   Total Protein 6.9 6.5 - 8.1 g/dL   Albumin 3.9 3.5 - 5.0 g/dL   AST 36 15 - 41 U/L   ALT 36 0 - 44 U/L   Alkaline Phosphatase 99 38 - 126 U/L   Total Bilirubin 0.4 0.3 - 1.2 mg/dL   GFR calc non Af Amer >60 >60 mL/min   GFR calc Af Amer >60 >60 mL/min   Anion gap 10 5 - 15  CBC with Differential  Result Value Ref Range   WBC 8.4 4.0 - 10.5 K/uL   RBC 4.77 4.22 - 5.81 MIL/uL   Hemoglobin 15.2 13.0 - 17.0 g/dL   HCT 42.5 39.0 - 52.0 %   MCV 89.1 80.0 - 100.0 fL   MCH 31.9 26.0 - 34.0 pg   MCHC 35.8 30.0 - 36.0 g/dL   RDW 12.1 11.5 - 15.5 %   Platelets 216 150 - 400 K/uL   nRBC 0.0 0.0 - 0.2 %   Neutrophils Relative % 51 %   Neutro Abs 4.3 1.7 - 7.7 K/uL   Lymphocytes Relative 36 %  Lymphs Abs 3.1 0.7 - 4.0 K/uL   Monocytes Relative 7 %   Monocytes Absolute 0.6 0.1 - 1.0 K/uL   Eosinophils Relative 4 %   Eosinophils Absolute 0.4 0.0 - 0.5 K/uL   Basophils Relative 1 %   Basophils Absolute 0.1 0.0 - 0.1 K/uL   Immature Granulocytes 1 %   Abs Immature Granulocytes 0.07 0.00 - 0.07 K/uL  Ethanol  Result Value Ref Range   Alcohol, Ethyl (B) 186 (H) <10 mg/dL       Pertinent labs & imaging results that were available during my care of the patient were reviewed by me and considered in my medical decision making.  Assessment & Plan:  Tobenna was seen today for establish care, medical management of chronic issues, hyperlipidemia and hypertension.  Diagnoses and all orders for this visit:  Encounter to establish care  Hypertension associated with type 2 diabetes mellitus ([place]) BP elevated in office today. DASH diet discussed. Pt to monitor BP at home and bring readings to next appointment. Labs pending. Continue below.  -     CMP14+EGFR -     CBC with Differential/Platelet -     Thyroid Panel With TSH -     Microalbumin / creatinine urine ratio -     amLODipine (NORVASC) 10 MG tablet; Take 1 tablet (10 mg total) by mouth daily. -     hydrochlorothiazide  (HYDRODIURIL) 25 MG tablet; Take 1 tablet (25 mg total) by mouth daily. For high blood pressure  Chronic idiopathic gout involving toe of left foot without tophus No flares with preventative allopurinol. Will continue below. Labs pending.  -     allopurinol (ZYLOPRIM) 300 MG tablet; Take 1 tablet (300 mg total) by mouth daily. -     Uric acid  Elevated random blood glucose level -     CMP14+EGFR -     Bayer DCA Hb A1c Waived -     Microalbumin / creatinine urine ratio  Encounter for screening for HIV -     HIV Antibody (routine testing w rflx)  Gastroesophageal reflux disease without esophagitis Well controlled. Continue below.  -     pantoprazole (PROTONIX) 40 MG tablet; Take 1 tablet (40 mg total) by mouth daily. For acid reflux  Type 2 diabetes mellitus without complication, with no history of insulin use ([place]) New diagnosis. A1C 7.1 in office today. Previous A1Cs have been above 6 but pt was not started on medications. Long discussion about disease process, complications that can arise from uncontrolled blood sugars, and lifestyle modifications needed. Will initiate once daily metformin today. Blood glucose meter kit ordered. Pt aware he can return to office for instructions on meter use. Pt provided blood sugar log and aware to report any blood sugars below 60 or above 300. Follow up in 1 month.  -     metFORMIN (GLUCOPHAGE) 500 MG tablet; Take 1 tablet (500 mg total) by mouth daily with breakfast. -     blood glucose meter kit and supplies; Dispense based on patient and insurance preference. Use up to four times daily as directed. (FOR ICD-10 E10.9, E11.9).  Hyperlipidemia associated with type 2 diabetes mellitus ([place]) Diet and exercise encouraged. Labs pending. Continue below.  -     CMP14+EGFR -     Lipid panel -     atorvastatin (LIPITOR) 10 MG tablet; Take 1 tablet (10 mg total) by mouth daily.     Continue all other maintenance medications.   Total time spent  with  patient 60 minutes.  Greater than 50% of encounter spent in coordination of care/counseling.  Follow up plan: Return in about 1 month (around 09/23/2018), or if symptoms worsen or fail to improve, for HTN, Lipids, DM.  Educational handout given for Type 2 Diabetes Mellitus  The above assessment and management plan was discussed with the patient. The patient verbalized understanding of and has agreed to the management plan. Patient is aware to call the clinic if symptoms persist or worsen. Patient is aware when to return to the clinic for a follow-up visit. Patient educated on when it is appropriate to go to the emergency department.   Monia Pouch, FNP-C [facility] [phone] 08/23/18

## 2018-08-24 LAB — CMP14+EGFR
ALT: 40 IU/L (ref 0–44)
AST: 35 IU/L (ref 0–40)
Albumin/Globulin Ratio: 1.7 (ref 1.2–2.2)
Albumin: 4.7 g/dL (ref 3.8–4.9)
Alkaline Phosphatase: 121 IU/L — ABNORMAL HIGH (ref 39–117)
BUN/Creatinine Ratio: 13 (ref 9–20)
BUN: 12 mg/dL (ref 6–24)
Bilirubin Total: 0.3 mg/dL (ref 0.0–1.2)
CO2: 24 mmol/L (ref 20–29)
Calcium: 9.9 mg/dL (ref 8.7–10.2)
Chloride: 102 mmol/L (ref 96–106)
Creatinine, Ser: 0.96 mg/dL (ref 0.76–1.27)
GFR calc Af Amer: 104 mL/min/{1.73_m2} (ref >59)
GFR calc non Af Amer: 90 mL/min/{1.73_m2} (ref >59)
Globulin, Total: 2.7 g/dL (ref 1.5–4.5)
Glucose: 114 mg/dL — ABNORMAL HIGH (ref 65–99)
Potassium: 4.8 mmol/L (ref 3.5–5.2)
Sodium: 144 mmol/L (ref 134–144)
Total Protein: 7.4 g/dL (ref 6.0–8.5)

## 2018-08-24 LAB — CBC WITH DIFFERENTIAL/PLATELET
Basophils Absolute: 0.1 10*3/uL (ref 0.0–0.2)
Basos: 1 %
EOS (ABSOLUTE): 0.4 10*3/uL (ref 0.0–0.4)
Eos: 4 %
Hematocrit: 47.1 % (ref 37.5–51.0)
Hemoglobin: 16.1 g/dL (ref 13.0–17.7)
Immature Grans (Abs): 0 10*3/uL (ref 0.0–0.1)
Immature Granulocytes: 0 %
Lymphocytes Absolute: 2.6 10*3/uL (ref 0.7–3.1)
Lymphs: 32 %
MCH: 32.1 pg (ref 26.6–33.0)
MCHC: 34.2 g/dL (ref 31.5–35.7)
MCV: 94 fL (ref 79–97)
Monocytes Absolute: 0.7 10*3/uL (ref 0.1–0.9)
Monocytes: 8 %
Neutrophils Absolute: 4.5 10*3/uL (ref 1.4–7.0)
Neutrophils: 55 %
Platelets: 258 10*3/uL (ref 150–450)
RBC: 5.02 x10E6/uL (ref 4.14–5.80)
RDW: 13.3 % (ref 11.6–15.4)
WBC: 8.3 10*3/uL (ref 3.4–10.8)

## 2018-08-24 LAB — THYROID PANEL WITH TSH
Free Thyroxine Index: 1.4 (ref 1.2–4.9)
T3 Uptake Ratio: 27 % (ref 24–39)
T4, Total: 5.3 ug/dL (ref 4.5–12.0)
TSH: 0.683 u[IU]/mL (ref 0.450–4.500)

## 2018-08-24 LAB — LIPID PANEL
Chol/HDL Ratio: 4.6 ratio (ref 0.0–5.0)
Cholesterol, Total: 182 mg/dL (ref 100–199)
HDL: 40 mg/dL (ref >39)
LDL Calculated: 106 mg/dL — ABNORMAL HIGH (ref 0–99)
Triglycerides: 178 mg/dL — ABNORMAL HIGH (ref 0–149)
VLDL Cholesterol Cal: 36 mg/dL (ref 5–40)

## 2018-08-24 LAB — MICROALBUMIN / CREATININE URINE RATIO
Creatinine, Urine: 62.5 mg/dL
Microalb/Creat Ratio: 218 mg/g creat — ABNORMAL HIGH (ref 0–29)
Microalbumin, Urine: 136.5 ug/mL

## 2018-08-24 LAB — HIV ANTIBODY (ROUTINE TESTING W REFLEX): HIV Screen 4th Generation wRfx: NONREACTIVE

## 2018-08-25 ENCOUNTER — Other Ambulatory Visit: Payer: Self-pay | Admitting: Family Medicine

## 2018-08-25 ENCOUNTER — Telehealth: Payer: Self-pay | Admitting: Family Medicine

## 2018-08-25 DIAGNOSIS — E1159 Type 2 diabetes mellitus with other circulatory complications: Secondary | ICD-10-CM

## 2018-08-25 DIAGNOSIS — I152 Hypertension secondary to endocrine disorders: Secondary | ICD-10-CM

## 2018-08-25 LAB — SPECIMEN STATUS REPORT

## 2018-08-25 LAB — URIC ACID: Uric Acid: 4.7 mg/dL (ref 3.7–8.6)

## 2018-08-25 MED ORDER — ATORVASTATIN CALCIUM 20 MG PO TABS: 20.0000 mg | ORAL_TABLET | Freq: Every day | ORAL | 3 refills | Status: DC

## 2018-08-25 MED ORDER — ENALAPRIL MALEATE 2.5 MG PO TABS: 2.5000 mg | ORAL_TABLET | Freq: Every day | ORAL | 3 refills | Status: DC

## 2018-08-25 NOTE — Addendum Note (Signed)
Addended by: Baruch Gouty on: 08/25/2018 09:17 AM   Modules accepted: Orders

## 2018-08-25 NOTE — Telephone Encounter (Signed)
Pharmacist stated that Lipitor 10 was originally sent in and then another rx for lipitor 20mg  was sent. Wanted to confirm which strength was correct. Advised that after reviewing labs that lipitor was increased to 20mg . Pharmacy verbalized understanding

## 2018-08-30 ENCOUNTER — Encounter: Payer: Self-pay | Admitting: *Deleted

## 2018-09-01 ENCOUNTER — Telehealth: Payer: Self-pay | Admitting: Family Medicine

## 2018-09-01 ENCOUNTER — Other Ambulatory Visit: Payer: Self-pay | Admitting: *Deleted

## 2018-09-01 MED ORDER — EFFEXOR XR 75 MG PO CP24: 225.0000 mg | ORAL_CAPSULE | ORAL | 0 refills | Status: DC

## 2018-09-01 NOTE — Telephone Encounter (Signed)
Pharm called: effexor was changed to generic ( to save approx $1000)

## 2018-09-07 ENCOUNTER — Telehealth (INDEPENDENT_AMBULATORY_CARE_PROVIDER_SITE_OTHER): Payer: Self-pay | Admitting: *Deleted

## 2018-09-07 ENCOUNTER — Encounter (INDEPENDENT_AMBULATORY_CARE_PROVIDER_SITE_OTHER): Payer: Self-pay | Admitting: *Deleted

## 2018-09-07 NOTE — Telephone Encounter (Addendum)
Called patient several times to schedule TCS, no answer & no voice mail  Referring MD/PCP:    Procedure: tcs [phone]  Reason/Indication:  screening  Has patient had this procedure before?  no  If so, when, by whom and where?    Is there a family history of colon cancer?  no  Who?  What age when diagnosed?    Is patient diabetic?   no      Does patient have prosthetic heart valve or mechanical valve?  no  Do you have a pacemaker/defibrillator?  no  Has patient ever had endocarditis/atrial fibrillation? no  Does patient use oxygen?  Has patient had joint replacement within last 12 months?  no  Is patient constipated or do they take laxatives? no  Does patient have a history of alcohol/drug use?  no  Is patient on blood thinner such as Coumadin, Plavix and/or Aspirin? no  Medications: pantoprazole 40 mg daily, venlafaxine 75 mg daily, atorvastatin 10 mg daily, amlodipine 10 mg daily, allopurinol 300 mg daily, hctz 25 mg daily, acetaminophen 325 mg daily  Allergies: nkda  Medication Adjustment per Dr Lindi Adie, NP:  Procedure date & time:

## 2018-09-10 ENCOUNTER — Other Ambulatory Visit: Payer: Self-pay | Admitting: *Deleted

## 2018-09-10 DIAGNOSIS — E1159 Type 2 diabetes mellitus with other circulatory complications: Secondary | ICD-10-CM

## 2018-09-10 MED ORDER — ENALAPRIL MALEATE 2.5 MG PO TABS: 2.5000 mg | ORAL_TABLET | Freq: Every day | ORAL | 0 refills | Status: DC

## 2018-09-16 LAB — HM DIABETES EYE EXAM

## 2018-09-17 ENCOUNTER — Other Ambulatory Visit: Payer: Self-pay | Admitting: *Deleted

## 2018-09-17 DIAGNOSIS — K219 Gastro-esophageal reflux disease without esophagitis: Secondary | ICD-10-CM

## 2018-09-17 MED ORDER — PANTOPRAZOLE SODIUM 40 MG PO TBEC: 40.0000 mg | DELAYED_RELEASE_TABLET | Freq: Every day | ORAL | 1 refills | Status: DC

## 2018-09-20 ENCOUNTER — Other Ambulatory Visit: Payer: Self-pay | Admitting: *Deleted

## 2018-09-20 ENCOUNTER — Other Ambulatory Visit (INDEPENDENT_AMBULATORY_CARE_PROVIDER_SITE_OTHER): Payer: Self-pay | Admitting: *Deleted

## 2018-09-20 ENCOUNTER — Other Ambulatory Visit: Payer: Self-pay

## 2018-09-20 DIAGNOSIS — I152 Hypertension secondary to endocrine disorders: Secondary | ICD-10-CM

## 2018-09-20 DIAGNOSIS — Z1211 Encounter for screening for malignant neoplasm of colon: Secondary | ICD-10-CM

## 2018-09-20 DIAGNOSIS — E1159 Type 2 diabetes mellitus with other circulatory complications: Secondary | ICD-10-CM

## 2018-09-20 MED ORDER — HYDROCHLOROTHIAZIDE 25 MG PO TABS: 25.0000 mg | ORAL_TABLET | Freq: Every day | ORAL | 1 refills | Status: DC

## 2018-09-20 NOTE — Telephone Encounter (Signed)
08/23/18 RF set on no print, sent to [place]

## 2018-09-22 ENCOUNTER — Encounter: Payer: Self-pay | Admitting: Family Medicine

## 2018-09-22 ENCOUNTER — Ambulatory Visit (INDEPENDENT_AMBULATORY_CARE_PROVIDER_SITE_OTHER): Payer: Medicare Other | Admitting: Family Medicine

## 2018-09-22 ENCOUNTER — Other Ambulatory Visit: Payer: Self-pay

## 2018-09-22 VITALS — BP 147/83 | HR 83 | Temp 96.9°F | Ht 74.0 in | Wt 193.0 lb

## 2018-09-22 DIAGNOSIS — E1129 Type 2 diabetes mellitus with other diabetic kidney complication: Secondary | ICD-10-CM | POA: Insufficient documentation

## 2018-09-22 DIAGNOSIS — E1159 Type 2 diabetes mellitus with other circulatory complications: Secondary | ICD-10-CM | POA: Diagnosis not present

## 2018-09-22 DIAGNOSIS — R748 Abnormal levels of other serum enzymes: Secondary | ICD-10-CM | POA: Diagnosis not present

## 2018-09-22 DIAGNOSIS — I10 Essential (primary) hypertension: Secondary | ICD-10-CM

## 2018-09-22 DIAGNOSIS — E119 Type 2 diabetes mellitus without complications: Secondary | ICD-10-CM | POA: Diagnosis not present

## 2018-09-22 DIAGNOSIS — R809 Proteinuria, unspecified: Secondary | ICD-10-CM | POA: Diagnosis not present

## 2018-09-22 NOTE — Patient Instructions (Signed)
DASH Eating Plan DASH stands for "Dietary Approaches to Stop Hypertension." The DASH eating plan is a healthy eating plan that has been shown to reduce high blood pressure (hypertension). Additional health benefits may include reducing the risk of type 2 diabetes mellitus, heart disease, and stroke. The DASH eating plan may also help with weight loss.  WHAT DO I NEED TO KNOW ABOUT THE DASH EATING PLAN? For the DASH eating plan, you will follow these general guidelines:  Choose foods with a percent daily value for sodium of less than 5% (as listed on the food label).  Use salt-free seasonings or herbs instead of table salt or sea salt.  Check with your health care provider or pharmacist before using salt substitutes.  Eat lower-sodium products, often labeled as "lower sodium" or "no salt added."  Eat fresh foods.  Eat more vegetables, fruits, and low-fat dairy products.  Choose whole grains. Look for the word "whole" as the first word in the ingredient list.  Choose fish and skinless chicken or turkey more often than red meat. Limit fish, poultry, and meat to 6 oz (170 g) each day.  Limit sweets, desserts, sugars, and sugary drinks.  Choose heart-healthy fats.  Limit cheese to 1 oz (28 g) per day.  Eat more home-cooked food and less restaurant, buffet, and fast food.  Limit fried foods.  Cook foods using methods other than frying.  Limit canned vegetables. If you do use them, rinse them well to decrease the sodium.  When eating at a restaurant, ask that your food be prepared with less salt, or no salt if possible.  WHAT FOODS CAN I EAT? Seek help from a dietitian for individual calorie needs.  Grains Whole grain or whole wheat bread. Brown rice. Whole grain or whole wheat pasta. Quinoa, bulgur, and whole grain cereals. Low-sodium cereals. Corn or whole wheat flour tortillas. Whole grain cornbread. Whole grain crackers. Low-sodium crackers.  Vegetables Fresh or frozen  vegetables (raw, steamed, roasted, or grilled). Low-sodium or reduced-sodium tomato and vegetable juices. Low-sodium or reduced-sodium tomato sauce and paste. Low-sodium or reduced-sodium canned vegetables.   Fruits All fresh, canned (in natural juice), or frozen fruits.  Meat and Other Protein Products Ground beef (85% or leaner), grass-fed beef, or beef trimmed of fat. Skinless chicken or turkey. Ground chicken or turkey. Pork trimmed of fat. All fish and seafood. Eggs. Dried beans, peas, or lentils. Unsalted nuts and seeds. Unsalted canned beans.  Dairy Low-fat dairy products, such as skim or 1% milk, 2% or reduced-fat cheeses, low-fat ricotta or cottage cheese, or plain low-fat yogurt. Low-sodium or reduced-sodium cheeses.  Fats and Oils Tub margarines without trans fats. Light or reduced-fat mayonnaise and salad dressings (reduced sodium). Avocado. Safflower, olive, or canola oils. Natural peanut or almond butter.  Other Unsalted popcorn and pretzels. The items listed above may not be a complete list of recommended foods or beverages. Contact your dietitian for more options.  WHAT FOODS ARE NOT RECOMMENDED?  Grains White bread. White pasta. White rice. Refined cornbread. Bagels and croissants. Crackers that contain trans fat.  Vegetables Creamed or fried vegetables. Vegetables in a cheese sauce. Regular canned vegetables. Regular canned tomato sauce and paste. Regular tomato and vegetable juices.  Fruits Dried fruits. Canned fruit in light or heavy syrup. Fruit juice.  Meat and Other Protein Products Fatty cuts of meat. Ribs, chicken wings, bacon, sausage, bologna, salami, chitterlings, fatback, hot dogs, bratwurst, and packaged luncheon meats. Salted nuts and seeds. Canned beans with salt.    Dairy Whole or 2% milk, cream, half-and-half, and cream cheese. Whole-fat or sweetened yogurt. Full-fat cheeses or blue cheese. Nondairy creamers and whipped toppings. Processed cheese,  cheese spreads, or cheese curds.  Condiments Onion and garlic salt, seasoned salt, table salt, and sea salt. Canned and packaged gravies. Worcestershire sauce. Tartar sauce. Barbecue sauce. Teriyaki sauce. Soy sauce, including reduced sodium. Steak sauce. Fish sauce. Oyster sauce. Cocktail sauce. Horseradish. Ketchup and mustard. Meat flavorings and tenderizers. Bouillon cubes. Hot sauce. Tabasco sauce. Marinades. Taco seasonings. Relishes.  Fats and Oils Butter, stick margarine, lard, shortening, ghee, and bacon fat. Coconut, palm kernel, or palm oils. Regular salad dressings.  Other Pickles and olives. Salted popcorn and pretzels.  The items listed above may not be a complete list of foods and beverages to avoid. Contact your dietitian for more information.  WHERE CAN I FIND MORE INFORMATION? National Heart, Lung, and [employer] Institute: www.nhlbi.nih.gov/health/health-topics/topics/dash/ Document Released: 01/02/2011 Document Revised: 05/30/2013 Document Reviewed: 11/17/2012 ExitCare Patient Information 2015 [employer]. This information is not intended to replace advice given to you by your health care provider. Make sure you discuss any questions you have with your health care provider.   I think that you would greatly benefit from seeing a nutritionist.  If you are interested, please call Dr Sykes at [phone] to schedule an appointment.   

## 2018-09-22 NOTE — Progress Notes (Signed)
Subjective:  Patient ID: Frank Moses, male    DOB: [birth year], 53 y.o.   MRN: [id number]  Patient Care Team: Baruch Gouty, FNP as PCP - General (Family Medicine) Dione Housekeeper, MD (Family Medicine)   Chief Complaint:  Medical Management of Chronic Issues (1 mo follow up )   HPI: Frank Moses is a 53 y.o. male presenting on 09/22/2018 for Medical Management of Chronic Issues (1 mo follow up )  1. Hypertension associated with diabetes ([place])  Pt presented as a new pt on 08/23/2018. Pts BP was elevated at 145/84 during this visit. Upon reviewing records from previous provider it was noted the BP had been elevated for several visits. Pt was started on HCTZ and Vasotec after this visit due to elevated blood pressure and bumped kidney function. Pt states he has been compliant with the medications. States he has been checking his blood pressures at home and they are running 140/80. He denies headaches, chest pain, shortness of breath, palpitations, leg swelling, or confusion. Compliant with medications without associated side effects.    2. Type 2 diabetes mellitus without complication, with no history of insulin use ([place])  Compliant with medications. States BS has been running in the 140's. No polyuria, polyphagia, or polydipsia.    3. Elevated alkaline phosphatase level  Alk phos was elevated. Will recheck with GGT today. No abdominal or bone pain. No confusion, color change, or abdominal distention.    4. Microalbuminuria due to type 2 diabetes mellitus (HCC)  No change in urine output. No swelling, shortness of breath, palpitations, confusion, weakness, or confusion.      Relevant past medical, surgical, family, and social history reviewed and updated as indicated.  Allergies and medications reviewed and updated. Date reviewed: Chart in Epic.   Past Medical History:  Diagnosis Date  . Arthritis   . Asthma   . Bipolar 1 disorder ([place])   . Bipolar affective ([place])   .  Depression   . Gout   . Hyperlipidemia   . Hypertension   . Peptic ulcer   . Schizophrenia ([place])   . Stroke [facility])     Past Surgical History:  Procedure Laterality Date  . bil foot surgery    . CERVICAL FUSION    . FOOT SURGERY    . HIP SURGERY Left   . NECK SURGERY     cyst removed from neck     Social History   Socioeconomic History  . Marital status: Single    Spouse name: Not on file  . Number of children: Not on file  . Years of education: Not on file  . Highest education level: Not on file  Occupational History  . Not on file  Social Needs  . Financial resource strain: Not on file  . Food insecurity    Worry: Not on file    Inability: Not on file  . Transportation needs    Medical: Not on file    Non-medical: Not on file  Tobacco Use  . Smoking status: Current Every Day Smoker  . Smokeless tobacco: Never Used  Substance and Sexual Activity  . Alcohol use: Yes    Comment: 3-6 cans of beer every 3-4 days  . Drug use: Never  . Sexual activity: Yes    Birth control/protection: None  Lifestyle  . Physical activity    Days per week: Not on file    Minutes per session: Not on file  . Stress: Not  on file  Relationships  . Social Herbalist on phone: Not on file    Gets together: Not on file    Attends religious service: Not on file    Active member of club or organization: Not on file    Attends meetings of clubs or organizations: Not on file    Relationship status: Not on file  . Intimate partner violence    Fear of current or ex partner: Not on file    Emotionally abused: Not on file    Physically abused: Not on file    Forced sexual activity: Not on file  Other Topics Concern  . Not on file  Social History Narrative   ** Merged History Encounter **        Outpatient Encounter Medications as of 09/22/2018  Medication Sig  . allopurinol (ZYLOPRIM) 300 MG tablet Take 1 tablet (300 mg total) by mouth daily.  Marland Kitchen amLODipine (NORVASC) 10 MG  tablet Take 1 tablet (10 mg total) by mouth daily.  . Aspirin-Salicylamide-Caffeine (BC HEADACHE POWDER PO) Take 1 packet by mouth as needed (for headaches).  Marland Kitchen atorvastatin (LIPITOR) 20 MG tablet Take 1 tablet (20 mg total) by mouth daily.  . blood glucose meter kit and supplies Dispense based on patient and insurance preference. Use up to four times daily as directed. (FOR ICD-10 E10.9, E11.9).  Marland Kitchen diphenhydramine-acetaminophen (TYLENOL PM) 25-500 MG TABS tablet Take 1-2 tablets by mouth at bedtime as needed (for sleep).  . EFFEXOR XR 75 MG 24 hr capsule Take 3 capsules (225 mg total) by mouth every morning.  . enalapril (VASOTEC) 2.5 MG tablet Take 1 tablet (2.5 mg total) by mouth daily.  . hydrochlorothiazide (HYDRODIURIL) 25 MG tablet Take 1 tablet (25 mg total) by mouth daily. For high blood pressure  . metFORMIN (GLUCOPHAGE) 500 MG tablet Take 1 tablet (500 mg total) by mouth daily with breakfast.  . pantoprazole (PROTONIX) 40 MG tablet Take 1 tablet (40 mg total) by mouth daily. For acid reflux   No facility-administered encounter medications on file as of 09/22/2018.     Allergies  Allergen Reactions  . Olanzapine Other (See Comments) and Itching    "Didn't agree with me"  . Orange Oil Other (See Comments)    Allergic to oranges -- Heartburn/indigestion  . Other     Steak - triggers gout flares   . Shrimp [Shellfish Allergy] Other (See Comments)    Triggers gout flare  . Tomato Other (See Comments)    Heartburn/indigestion  . Vicodin [Hydrocodone-Acetaminophen] Other (See Comments)    "Makes me sweat"  . Orange Fruit [Citrus]     Boil-like spots on skin  . Tomato Rash and Other (See Comments)    REACTION: Boil-like spots on skin    Review of Systems  Constitutional: Negative for activity change, appetite change, chills, diaphoresis, fatigue, fever and unexpected weight change.  HENT: Negative.   Eyes: Negative.  Negative for photophobia and visual disturbance.   Respiratory: Negative for cough, chest tightness and shortness of breath.   Cardiovascular: Negative for chest pain, palpitations and leg swelling.  Gastrointestinal: Negative for abdominal distention, abdominal pain, anal bleeding, blood in stool, constipation, diarrhea, nausea, rectal pain and vomiting.  Endocrine: Negative.  Negative for polydipsia, polyphagia and polyuria.  Genitourinary: Negative for decreased urine volume, difficulty urinating, dysuria, frequency and urgency.  Musculoskeletal: Negative for arthralgias and myalgias.  Skin: Negative for color change, pallor and rash.  Allergic/Immunologic: Negative.   Neurological:  Negative for dizziness, tremors, seizures, syncope, facial asymmetry, speech difficulty, weakness, light-headedness, numbness and headaches.  Hematological: Negative.  Negative for adenopathy. Does not bruise/bleed easily.  Psychiatric/Behavioral: Negative for confusion, hallucinations, sleep disturbance and suicidal ideas.  All other systems reviewed and are negative.       Objective:  BP (!) 147/83   Pulse 83   Temp (!) 96.9 F (36.1 C)   Ht 6' 2"  (1.88 m)   Wt 193 lb (87.5 kg)   BMI 24.78 kg/m    Wt Readings from Last 3 Encounters:  09/22/18 193 lb (87.5 kg)  08/23/18 199 lb (90.3 kg)  07/02/18 197 lb (89.4 kg)    Physical Exam Vitals signs and nursing note reviewed.  Constitutional:      General: He is not in acute distress.    Appearance: Normal appearance. He is well-developed and well-groomed. He is not ill-appearing, toxic-appearing or diaphoretic.  HENT:     Head: Normocephalic and atraumatic.     Jaw: There is normal jaw occlusion.     Right Ear: Hearing normal.     Left Ear: Hearing normal.     Nose: Nose normal.     Mouth/Throat:     Lips: Pink.     Mouth: Mucous membranes are moist.     Pharynx: Oropharynx is clear. Uvula midline.  Eyes:     General: Lids are normal.     Extraocular Movements: Extraocular movements intact.      Conjunctiva/sclera: Conjunctivae normal.     Pupils: Pupils are equal, round, and reactive to light.  Neck:     Musculoskeletal: Normal range of motion and neck supple.     Thyroid: No thyroid mass, thyromegaly or thyroid tenderness.     Vascular: No carotid bruit or JVD.     Trachea: Trachea and phonation normal.  Cardiovascular:     Rate and Rhythm: Normal rate and regular rhythm.     Chest Wall: PMI is not displaced.     Pulses: Normal pulses.     Heart sounds: Normal heart sounds. No murmur. No friction rub. No gallop.   Pulmonary:     Effort: Pulmonary effort is normal. No respiratory distress.     Breath sounds: Normal breath sounds. No wheezing.  Abdominal:     General: Bowel sounds are normal. There is no distension or abdominal bruit.     Palpations: Abdomen is soft. There is no shifting dullness, fluid wave, hepatomegaly, splenomegaly, mass or pulsatile mass.     Tenderness: There is no abdominal tenderness. There is no right CVA tenderness or left CVA tenderness.     Hernia: No hernia is present.  Musculoskeletal: Normal range of motion.     Right lower leg: No edema.     Left lower leg: No edema.  Lymphadenopathy:     Cervical: No cervical adenopathy.  Skin:    General: Skin is warm and dry.     Capillary Refill: Capillary refill takes less than 2 seconds.     Coloration: Skin is not cyanotic, jaundiced or pale.     Findings: No rash.  Neurological:     General: No focal deficit present.     Mental Status: He is alert and oriented to person, place, and time.     Cranial Nerves: Cranial nerves are intact. No cranial nerve deficit.     Sensory: Sensation is intact. No sensory deficit.     Motor: Motor function is intact. No weakness.  Coordination: Coordination is intact. Coordination normal.     Gait: Gait is intact. Gait normal.     Deep Tendon Reflexes: Reflexes are normal and symmetric. Reflexes normal.  Psychiatric:        Attention and Perception:  Attention and perception normal.        Mood and Affect: Mood and affect normal.        Speech: Speech normal.        Behavior: Behavior normal. Behavior is cooperative.        Thought Content: Thought content normal.        Cognition and Memory: Cognition and memory normal.        Judgment: Judgment normal.     Results for orders placed or performed in visit on 08/23/18  CMP14+EGFR  Result Value Ref Range   Glucose 114 (H) 65 - 99 mg/dL   BUN 12 6 - 24 mg/dL   Creatinine, Ser 0.96 0.76 - 1.27 mg/dL   GFR calc non Af Amer 90 >59 mL/min/1.73   GFR calc Af Amer 104 >59 mL/min/1.73   BUN/Creatinine Ratio 13 9 - 20   Sodium 144 134 - 144 mmol/L   Potassium 4.8 3.5 - 5.2 mmol/L   Chloride 102 96 - 106 mmol/L   CO2 24 20 - 29 mmol/L   Calcium 9.9 8.7 - 10.2 mg/dL   Total Protein 7.4 6.0 - 8.5 g/dL   Albumin 4.7 3.8 - 4.9 g/dL   Globulin, Total 2.7 1.5 - 4.5 g/dL   Albumin/Globulin Ratio 1.7 1.2 - 2.2   Bilirubin Total 0.3 0.0 - 1.2 mg/dL   Alkaline Phosphatase 121 (H) 39 - 117 IU/L   AST 35 0 - 40 IU/L   ALT 40 0 - 44 IU/L  CBC with Differential/Platelet  Result Value Ref Range   WBC 8.3 3.4 - 10.8 x10E3/uL   RBC 5.02 4.14 - 5.80 x10E6/uL   Hemoglobin 16.1 13.0 - 17.7 g/dL   Hematocrit 47.1 37.5 - 51.0 %   MCV 94 79 - 97 fL   MCH 32.1 26.6 - 33.0 pg   MCHC 34.2 31.5 - 35.7 g/dL   RDW 13.3 11.6 - 15.4 %   Platelets 258 150 - 450 x10E3/uL   Neutrophils 55 Not Estab. %   Lymphs 32 Not Estab. %   Monocytes 8 Not Estab. %   Eos 4 Not Estab. %   Basos 1 Not Estab. %   Neutrophils Absolute 4.5 1.4 - 7.0 x10E3/uL   Lymphocytes Absolute 2.6 0.7 - 3.1 x10E3/uL   Monocytes Absolute 0.7 0.1 - 0.9 x10E3/uL   EOS (ABSOLUTE) 0.4 0.0 - 0.4 x10E3/uL   Basophils Absolute 0.1 0.0 - 0.2 x10E3/uL   Immature Granulocytes 0 Not Estab. %   Immature Grans (Abs) 0.0 0.0 - 0.1 x10E3/uL  Lipid panel  Result Value Ref Range   Cholesterol, Total 182 100 - 199 mg/dL   Triglycerides 178 (H) 0 -  149 mg/dL   HDL 40 >39 mg/dL   VLDL Cholesterol Cal 36 5 - 40 mg/dL   LDL Calculated 106 (H) 0 - 99 mg/dL   Chol/HDL Ratio 4.6 0.0 - 5.0 ratio  Thyroid Panel With TSH  Result Value Ref Range   TSH 0.683 0.450 - 4.500 uIU/mL   T4, Total 5.3 4.5 - 12.0 ug/dL   T3 Uptake Ratio 27 24 - 39 %   Free Thyroxine Index 1.4 1.2 - 4.9  Bayer DCA Hb A1c Waived  Result Value Ref  Range   HB A1C (BAYER DCA - WAIVED) 7.1 (H) <7.0 %  Microalbumin / creatinine urine ratio  Result Value Ref Range   Creatinine, Urine 62.5 Not Estab. mg/dL   Microalbumin, Urine 136.5 Not Estab. ug/mL   Microalb/Creat Ratio 218 (H) 0 - 29 mg/g creat  HIV Antibody (routine testing w rflx)  Result Value Ref Range   HIV Screen 4th Generation wRfx Non Reactive Non Reactive  Uric acid  Result Value Ref Range   Uric Acid 4.7 3.7 - 8.6 mg/dL  Specimen status report  Result Value Ref Range   specimen status report Comment        Pertinent labs & imaging results that were available during my care of the patient were reviewed by me and considered in my medical decision making.  Assessment & Plan:  Frank Moses was seen today for medical management of chronic issues.  Diagnoses and all orders for this visit:  Hypertension associated with diabetes ([place]) Poorly controlled. Pt reports compliance with medications. Will not adjust medications today due to renal function. Awaiting lab results. DASH diet and exercise discussed in detail. Pt to follow up in 2-3 months for reevaluation. Pt aware to report any persistent high or low readings.  -     Microalbumin / creatinine urine ratio -     CMP14+EGFR  Type 2 diabetes mellitus without complication, with no history of insulin use (HCC) Continue medications as prescribed. Report any persistent high or low reading. Follow up in 2-3 months for reevaluation.  -     Microalbumin / creatinine urine ratio  Elevated alkaline phosphatase level Labs pending. Pt aware if levels remain elevated  an [place] will be ordered.  -     Gamma GT  Microalbuminuria due to type 2 diabetes mellitus ([place]) Will recheck today.  -     Microalbumin / creatinine urine ratio     Continue all other maintenance medications.  Follow up plan: Return in about 3 months (around 12/23/2018), or if symptoms worsen or fail to improve, for DM, HTN, Kidney.  Continue healthy lifestyle choices, including diet (rich in fruits, vegetables, and lean proteins, and low in salt and simple carbohydrates) and exercise (at least 30 minutes of moderate physical activity daily).  Educational handout given for DASH diet  The above assessment and management plan was discussed with the patient. The patient verbalized understanding of and has agreed to the management plan. Patient is aware to call the clinic if symptoms persist or worsen. Patient is aware when to return to the clinic for a follow-up visit. Patient educated on when it is appropriate to go to the emergency department.   Monia Pouch, FNP-C [facility] [phone] 09/22/18'

## 2018-09-23 LAB — CMP14+EGFR
ALT: 44 IU/L (ref 0–44)
AST: 49 IU/L — ABNORMAL HIGH (ref 0–40)
Albumin/Globulin Ratio: 1.7 (ref 1.2–2.2)
Albumin: 4.8 g/dL (ref 3.8–4.9)
Alkaline Phosphatase: 124 IU/L — ABNORMAL HIGH (ref 39–117)
BUN/Creatinine Ratio: 12 (ref 9–20)
BUN: 11 mg/dL (ref 6–24)
Bilirubin Total: 0.7 mg/dL (ref 0.0–1.2)
CO2: 24 mmol/L (ref 20–29)
Calcium: 10 mg/dL (ref 8.7–10.2)
Chloride: 99 mmol/L (ref 96–106)
Creatinine, Ser: 0.92 mg/dL (ref 0.76–1.27)
GFR calc Af Amer: 109 mL/min/{1.73_m2} (ref >59)
GFR calc non Af Amer: 95 mL/min/{1.73_m2} (ref >59)
Globulin, Total: 2.9 g/dL (ref 1.5–4.5)
Glucose: 122 mg/dL — ABNORMAL HIGH (ref 65–99)
Potassium: 4.3 mmol/L (ref 3.5–5.2)
Sodium: 140 mmol/L (ref 134–144)
Total Protein: 7.7 g/dL (ref 6.0–8.5)

## 2018-09-23 LAB — MICROALBUMIN / CREATININE URINE RATIO
Creatinine, Urine: 60.5 mg/dL
Microalb/Creat Ratio: 217 mg/g creat — ABNORMAL HIGH (ref 0–29)
Microalbumin, Urine: 131 ug/mL

## 2018-09-23 LAB — GAMMA GT: GGT: 207 IU/L — ABNORMAL HIGH (ref 0–65)

## 2018-09-23 NOTE — Addendum Note (Signed)
Addended by: Baruch Gouty on: 09/23/2018 11:33 AM   Modules accepted: Orders

## 2018-09-27 ENCOUNTER — Telehealth: Payer: Self-pay | Admitting: Family Medicine

## 2018-10-01 ENCOUNTER — Ambulatory Visit (HOSPITAL_COMMUNITY): Payer: Medicare Other

## 2018-10-08 ENCOUNTER — Other Ambulatory Visit: Payer: Self-pay

## 2018-10-08 ENCOUNTER — Ambulatory Visit (HOSPITAL_COMMUNITY)
Admission: RE | Admit: 2018-10-08 | Discharge: 2018-10-08 | Disposition: A | Discharge status: Home / Self Care | Payer: Medicare Other | Source: Ambulatory Visit | Attending: Family Medicine | Admitting: Family Medicine

## 2018-10-08 DIAGNOSIS — R748 Abnormal levels of other serum enzymes: Secondary | ICD-10-CM | POA: Insufficient documentation

## 2018-10-11 ENCOUNTER — Other Ambulatory Visit: Payer: Self-pay | Admitting: Family Medicine

## 2018-10-11 DIAGNOSIS — K760 Fatty (change of) liver, not elsewhere classified: Secondary | ICD-10-CM

## 2018-10-19 ENCOUNTER — Encounter: Payer: Self-pay | Admitting: *Deleted

## 2018-10-29 ENCOUNTER — Telehealth (INDEPENDENT_AMBULATORY_CARE_PROVIDER_SITE_OTHER): Payer: Self-pay | Admitting: *Deleted

## 2018-10-29 ENCOUNTER — Encounter (INDEPENDENT_AMBULATORY_CARE_PROVIDER_SITE_OTHER): Payer: Self-pay | Admitting: *Deleted

## 2018-10-29 DIAGNOSIS — Z1211 Encounter for screening for malignant neoplasm of colon: Secondary | ICD-10-CM

## 2018-10-29 MED ORDER — PEG 3350-KCL-NA BICARB-NACL 420 G PO SOLR: 4000.0000 mL | Freq: Once | ORAL | 0 refills | Status: AC

## 2018-10-29 NOTE — Telephone Encounter (Signed)
Patient needs trilyte TCS sch'd 11/4

## 2018-10-30 ENCOUNTER — Other Ambulatory Visit: Payer: Self-pay | Admitting: Family Medicine

## 2018-10-30 DIAGNOSIS — E1159 Type 2 diabetes mellitus with other circulatory complications: Secondary | ICD-10-CM

## 2018-11-03 ENCOUNTER — Telehealth (INDEPENDENT_AMBULATORY_CARE_PROVIDER_SITE_OTHER): Payer: Self-pay | Admitting: *Deleted

## 2018-11-03 ENCOUNTER — Ambulatory Visit (INDEPENDENT_AMBULATORY_CARE_PROVIDER_SITE_OTHER): Payer: Self-pay

## 2018-11-03 ENCOUNTER — Other Ambulatory Visit: Payer: Self-pay

## 2018-11-03 NOTE — Telephone Encounter (Signed)
  Referring MD/PCP:    Procedure: tcs           [id number]  Reason/Indication:  screening  Has patient had this procedure before?  no             If so, when, by whom and where?    Is there a family history of colon cancer?  no             Who?  What age when diagnosed?    Is patient diabetic?   no                                                  Does patient have prosthetic heart valve or mechanical valve?  no  Do you have a pacemaker/defibrillator?  no  Has patient ever had endocarditis/atrial fibrillation? no  Does patient use oxygen?  Has patient had joint replacement within last 12 months?  no  Is patient constipated or do they take laxatives? no  Does patient have a history of alcohol/drug use?  no  Is patient on blood thinner such as Coumadin, Plavix and/or Aspirin? no  Medications: pantoprazole 40 mg daily, venlafaxine 75 mg daily, atorvastatin 10 mg daily, amlodipine 10 mg daily, allopurinol 300 mg daily, hctz 25 mg daily, acetaminophen 325 mg daily  Allergies: nkda  Medication Adjustment per Dr Lindi Adie, NP:  Procedure date & time: 12/01/18 at 830
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
## 2018-11-16 ENCOUNTER — Ambulatory Visit (INDEPENDENT_AMBULATORY_CARE_PROVIDER_SITE_OTHER): Payer: Medicare Other | Admitting: Nurse Practitioner

## 2018-11-23 NOTE — Telephone Encounter (Signed)
Okay to schedule colonoscopy with conscious sedation 

## 2018-11-29 ENCOUNTER — Other Ambulatory Visit (HOSPITAL_COMMUNITY)
Admission: RE | Admit: 2018-11-29 | Discharge: 2018-11-29 | Disposition: A | Discharge status: Home / Self Care | Payer: Medicare Other | Source: Ambulatory Visit | Attending: Internal Medicine | Admitting: Internal Medicine

## 2018-11-29 ENCOUNTER — Other Ambulatory Visit: Payer: Self-pay

## 2018-11-30 ENCOUNTER — Other Ambulatory Visit: Payer: Self-pay

## 2018-11-30 ENCOUNTER — Other Ambulatory Visit (HOSPITAL_COMMUNITY)
Admission: RE | Admit: 2018-11-30 | Discharge: 2018-11-30 | Disposition: A | Discharge status: Home / Self Care | Payer: Medicare Other | Source: Ambulatory Visit | Attending: Internal Medicine | Admitting: Internal Medicine

## 2018-11-30 DIAGNOSIS — Z01812 Encounter for preprocedural laboratory examination: Secondary | ICD-10-CM | POA: Diagnosis not present

## 2018-11-30 DIAGNOSIS — Z20828 Contact with and (suspected) exposure to other viral communicable diseases: Secondary | ICD-10-CM | POA: Diagnosis not present

## 2018-11-30 LAB — SARS CORONAVIRUS 2 (TAT 6-24 HRS): SARS Coronavirus 2: NEGATIVE

## 2018-12-01 ENCOUNTER — Encounter (HOSPITAL_COMMUNITY)
Admission: RE | Disposition: A | Discharge status: Home / Self Care | Payer: Self-pay | Source: Home / Self Care | Attending: Internal Medicine

## 2018-12-01 ENCOUNTER — Encounter (HOSPITAL_COMMUNITY): Payer: Self-pay | Admitting: *Deleted

## 2018-12-01 ENCOUNTER — Other Ambulatory Visit: Payer: Self-pay

## 2018-12-01 ENCOUNTER — Ambulatory Visit (HOSPITAL_COMMUNITY)
Admission: RE | Admit: 2018-12-01 | Discharge: 2018-12-01 | Disposition: A | Discharge status: Home / Self Care | Payer: Medicare Other | Attending: Internal Medicine | Admitting: Internal Medicine

## 2018-12-01 DIAGNOSIS — E785 Hyperlipidemia, unspecified: Secondary | ICD-10-CM | POA: Diagnosis not present

## 2018-12-01 DIAGNOSIS — J45909 Unspecified asthma, uncomplicated: Secondary | ICD-10-CM | POA: Diagnosis not present

## 2018-12-01 DIAGNOSIS — I10 Essential (primary) hypertension: Secondary | ICD-10-CM | POA: Insufficient documentation

## 2018-12-01 DIAGNOSIS — F319 Bipolar disorder, unspecified: Secondary | ICD-10-CM | POA: Insufficient documentation

## 2018-12-01 DIAGNOSIS — Z885 Allergy status to narcotic agent status: Secondary | ICD-10-CM | POA: Insufficient documentation

## 2018-12-01 DIAGNOSIS — Z8673 Personal history of transient ischemic attack (TIA), and cerebral infarction without residual deficits: Secondary | ICD-10-CM | POA: Insufficient documentation

## 2018-12-01 DIAGNOSIS — Z1211 Encounter for screening for malignant neoplasm of colon: Secondary | ICD-10-CM

## 2018-12-01 DIAGNOSIS — Z79899 Other long term (current) drug therapy: Secondary | ICD-10-CM | POA: Insufficient documentation

## 2018-12-01 DIAGNOSIS — Z809 Family history of malignant neoplasm, unspecified: Secondary | ICD-10-CM | POA: Insufficient documentation

## 2018-12-01 DIAGNOSIS — F17200 Nicotine dependence, unspecified, uncomplicated: Secondary | ICD-10-CM | POA: Diagnosis not present

## 2018-12-01 DIAGNOSIS — Z91018 Allergy to other foods: Secondary | ICD-10-CM | POA: Insufficient documentation

## 2018-12-01 DIAGNOSIS — Z91013 Allergy to seafood: Secondary | ICD-10-CM | POA: Insufficient documentation

## 2018-12-01 DIAGNOSIS — M109 Gout, unspecified: Secondary | ICD-10-CM | POA: Insufficient documentation

## 2018-12-01 DIAGNOSIS — K5730 Diverticulosis of large intestine without perforation or abscess without bleeding: Secondary | ICD-10-CM | POA: Diagnosis not present

## 2018-12-01 DIAGNOSIS — Z8711 Personal history of peptic ulcer disease: Secondary | ICD-10-CM | POA: Insufficient documentation

## 2018-12-01 DIAGNOSIS — Z888 Allergy status to other drugs, medicaments and biological substances status: Secondary | ICD-10-CM | POA: Diagnosis not present

## 2018-12-01 DIAGNOSIS — F209 Schizophrenia, unspecified: Secondary | ICD-10-CM | POA: Diagnosis not present

## 2018-12-01 DIAGNOSIS — Z981 Arthrodesis status: Secondary | ICD-10-CM | POA: Insufficient documentation

## 2018-12-01 DIAGNOSIS — Z7984 Long term (current) use of oral hypoglycemic drugs: Secondary | ICD-10-CM | POA: Diagnosis not present

## 2018-12-01 DIAGNOSIS — Z811 Family history of alcohol abuse and dependence: Secondary | ICD-10-CM | POA: Insufficient documentation

## 2018-12-01 DIAGNOSIS — M1990 Unspecified osteoarthritis, unspecified site: Secondary | ICD-10-CM | POA: Insufficient documentation

## 2018-12-01 HISTORY — PX: COLONOSCOPY: SHX5424

## 2018-12-01 LAB — GLUCOSE, CAPILLARY: Glucose-Capillary: 129 mg/dL — ABNORMAL HIGH (ref 70–99)

## 2018-12-01 SURGERY — COLONOSCOPY
Anesthesia: Moderate Sedation

## 2018-12-01 MED ORDER — MEPERIDINE HCL 50 MG/ML IJ SOLN
INTRAMUSCULAR | Status: DC | PRN
  Administered 2018-12-01 (×3): 25 mg via INTRAVENOUS

## 2018-12-01 MED ORDER — MIDAZOLAM HCL 5 MG/5ML IJ SOLN
INTRAMUSCULAR | Status: AC
  Filled 2018-12-01: qty 5

## 2018-12-01 MED ORDER — STERILE WATER FOR IRRIGATION IR SOLN
Status: DC | PRN
  Administered 2018-12-01: 09:00:00

## 2018-12-01 MED ORDER — MIDAZOLAM HCL 5 MG/5ML IJ SOLN
INTRAMUSCULAR | Status: DC | PRN
  Administered 2018-12-01 (×2): 2 mg via INTRAVENOUS
  Administered 2018-12-01: 1 mg via INTRAVENOUS
  Administered 2018-12-01 (×2): 2 mg via INTRAVENOUS

## 2018-12-01 MED ORDER — SODIUM CHLORIDE 0.9 % IV SOLN
INTRAVENOUS | Status: DC
  Administered 2018-12-01: 1000 mL via INTRAVENOUS

## 2018-12-01 MED ORDER — ONDANSETRON HCL 4 MG/2ML IJ SOLN
INTRAMUSCULAR | Status: AC
  Filled 2018-12-01: qty 2

## 2018-12-01 MED ORDER — MEPERIDINE HCL 50 MG/ML IJ SOLN
INTRAMUSCULAR | Status: AC
  Filled 2018-12-01: qty 1

## 2018-12-01 MED ORDER — MIDAZOLAM HCL 5 MG/5ML IJ SOLN
INTRAMUSCULAR | Status: AC
  Filled 2018-12-01: qty 10

## 2018-12-01 NOTE — Op Note (Signed)
[facility] Patient Name: Frank Moses Procedure Date: 12/01/2018 8:10 AM MRN: [id number] Date of Birth: 1965-06-07 Attending MD: Hildred Laser , MD CSN: [id number] Age: 53 Admit Type: Outpatient Procedure:                Colonoscopy Indications:              Screening for colorectal malignant neoplasm Providers:                Hildred Laser, MD, Hinton Rao, RN, Raphael Gibney, Technician Referring MD:             Connye Burkitt. Rakes, FNP Medicines:                Meperidine 75 mg IV, Midazolam 9 mg IV Complications:            No immediate complications. Estimated Blood Loss:     Estimated blood loss: none. Procedure:                Pre-Anesthesia Assessment:                           - Prior to the procedure, a History and Physical                            was performed, and patient medications and                            allergies were reviewed. The patient's tolerance of                            previous anesthesia was also reviewed. The risks                            and benefits of the procedure and the sedation                            options and risks were discussed with the patient.                            All questions were answered, and informed consent                            was obtained. Prior Anticoagulants: The patient has                            taken no previous anticoagulant or antiplatelet                            agents. ASA Grade Assessment: III - A patient with                            severe systemic disease. After reviewing the risks  and benefits, the patient was deemed in                            satisfactory condition to undergo the procedure.                           After obtaining informed consent, the colonoscope                            was passed under direct vision. Throughout the                            procedure, the patient's blood pressure, pulse, and                     oxygen saturations were monitored continuously. The                            [id number]) scope was introduced through                            the anus and advanced to the the cecum, identified                            by appendiceal orifice and ileocecal valve. The                            colonoscopy was performed without difficulty. The                            patient tolerated the procedure well. The quality                            of the bowel preparation was adequate. The                            ileocecal valve, appendiceal orifice, and rectum                            were photographed. Scope In: 8:39:52 AM Scope Out: 8:57:59 AM Scope Withdrawal Time: 0 hours 7 minutes 32 seconds  Total Procedure Duration: 0 hours 18 minutes 7 seconds  Findings:      The perianal and digital rectal examinations were normal.      A single small-mouthed diverticulum was found in the hepatic flexure.      The exam was otherwise normal throughout the examined colon.      The retroflexed view of the distal rectum and anal verge was normal and       showed no anal or rectal abnormalities. Impression:               - Diverticulosis at the hepatic flexure.                           - No specimens collected. Moderate Sedation:      Moderate (conscious) sedation was administered by the endoscopy nurse  and supervised by the endoscopist. The following parameters were       monitored: oxygen saturation, heart rate, blood pressure, CO2       capnography and response to care. Total physician intraservice time was       24 minutes. Recommendation:           - Patient has a contact number available for                            emergencies. The signs and symptoms of potential                            delayed complications were discussed with the                            patient. Return to normal activities tomorrow.                            Written  discharge instructions were provided to the                            patient.                           - Resume previous diet today.                           - Continue present medications.                           - Repeat colonoscopy in 10 years for screening                            purposes. Procedure Code(s):        --- Professional ---                           [phone], Colonoscopy, flexible; diagnostic, including                            collection of specimen(s) by brushing or washing,                            when performed (separate procedure)                           99153, Moderate sedation; each additional 15                            minutes intraservice time                           G0500, Moderate sedation services provided by the                            same physician or other qualified health care  professional performing a gastrointestinal                            endoscopic service that sedation supports,                            requiring the presence of an independent trained                            observer to assist in the monitoring of the                            patient's level of consciousness and physiological                            status; initial 15 minutes of intra-service time;                            patient age 27 years or older (additional time may                            be reported with [phone], as appropriate) Diagnosis Code(s):        --- Professional ---                           Z12.11, Encounter for screening for malignant                            neoplasm of colon                           K57.30, Diverticulosis of large intestine without                            perforation or abscess without bleeding CPT copyright 2019 American Medical Association. All rights reserved. The codes documented in this report are preliminary and upon coder review may  be revised to meet current  compliance requirements. Hildred Laser, MD Hildred Laser, MD 12/01/2018 9:06:34 AM This report has been signed electronically. Number of Addenda: 0

## 2018-12-01 NOTE — H&P (Signed)
Frank Moses is an 53 y.o. male.   Chief Complaint: Patient is here for colonoscopy. HPI: Patient is 53 year old Afro-American male presented for screening colonoscopy.  He denies abdominal pain change in bowel habits or rectal bleeding.  This is patient's first exam. He does not take OTC NSAIDs. Family history is negative for CRC.  Past Medical History:  Diagnosis Date  . Arthritis   . Asthma   . Bipolar 1 disorder ([place])   . Bipolar affective ([place])   . Depression   . Gout   . Hyperlipidemia   . Hypertension   . Peptic ulcer   . Schizophrenia ([place])   . Stroke [facility]) patient reports mild CVA 20 years ago.  He is not sure as to what caused CVA.  He has no residual.     Past Surgical History:  Procedure Laterality Date  . bil foot surgery    . CERVICAL FUSION    . FOOT SURGERY    . HIP SURGERY Left   . NECK SURGERY     cyst removed from neck     Family History  Problem Relation Age of Onset  . Alcoholism Father   . Cancer Father        unknown    Social History:  reports that he has been smoking. He has been smoking about 0.50 packs per day. He has never used smokeless tobacco. He reports current alcohol use. He reports that he does not use drugs.  Allergies:  Allergies  Allergen Reactions  . Olanzapine Other (See Comments) and Itching    "Didn't agree with me"  . Orange Oil Other (See Comments)    Allergic to oranges -- Heartburn/indigestion  . Other     Steak - triggers gout flares   . Shrimp [Shellfish Allergy] Other (See Comments)    Triggers gout flare  . Tomato Other (See Comments)    Heartburn/indigestion  . Vicodin [Hydrocodone-Acetaminophen] Other (See Comments)    "Makes me sweat"  . Orange Fruit [Citrus]     Boil-like spots on skin  . Tomato Rash and Other (See Comments)    REACTION: Boil-like spots on skin    Medications Prior to Admission  Medication Sig Dispense Refill  . allopurinol (ZYLOPRIM) 300 MG tablet Take 1 tablet (300 mg total) by  mouth daily. 90 tablet 1  . amLODipine (NORVASC) 10 MG tablet Take 1 tablet (10 mg total) by mouth daily. 90 tablet 1  . atorvastatin (LIPITOR) 20 MG tablet Take 1 tablet (20 mg total) by mouth daily. 90 tablet 3  . diphenhydramine-acetaminophen (TYLENOL PM) 25-500 MG TABS tablet Take 1-2 tablets by mouth at bedtime as needed (for sleep).    . EFFEXOR XR 75 MG 24 hr capsule Take 3 capsules (225 mg total) by mouth every morning. 90 capsule 0  . enalapril (VASOTEC) 2.5 MG tablet TAKE ONE TABLET BY MOUTH DAILY (Patient taking differently: Take 2.5 mg by mouth daily. ) 30 tablet 1  . hydrochlorothiazide (HYDRODIURIL) 25 MG tablet Take 1 tablet (25 mg total) by mouth daily. For high blood pressure 90 tablet 1  . metFORMIN (GLUCOPHAGE) 500 MG tablet Take 1 tablet (500 mg total) by mouth daily with breakfast. 180 tablet 3  . pantoprazole (PROTONIX) 40 MG tablet Take 1 tablet (40 mg total) by mouth daily. For acid reflux 90 tablet 1  . blood glucose meter kit and supplies Dispense based on patient and insurance preference. Use up to four times daily as directed. (FOR  ICD-10 E10.9, E11.9). 1 each 0    Results for orders placed or performed during the hospital encounter of 12/01/18 (from the past 48 hour(s))  Glucose, capillary     Status: Abnormal   Collection Time: 12/01/18  7:50 AM  Result Value Ref Range   Glucose-Capillary 129 (H) 70 - 99 mg/dL   No results found.  ROS  Blood pressure (!) 161/90, pulse 96, temperature 98.4 F (36.9 C), temperature source Axillary, resp. rate 18, height 6' 2"  (1.88 m), weight 89.8 kg, SpO2 97 %. Physical Exam  Constitutional: He appears well-developed and well-nourished.  HENT:  Mouth/Throat: Oropharynx is clear and moist.  Eyes: Conjunctivae are normal. No scleral icterus.  Neck: No thyromegaly present.  Cardiovascular: Normal rate, regular rhythm and normal heart sounds.  No murmur heard. Respiratory: Effort normal and breath sounds normal.  GI: Soft. He  exhibits no distension and no mass. There is no abdominal tenderness.  Musculoskeletal:        General: No edema.  Lymphadenopathy:    He has no cervical adenopathy.  Neurological: He is alert.  Skin: Skin is warm and dry.     Assessment/Plan Average risk screening colonoscopy.  Hildred Laser, MD 12/01/2018, 8:28 AM

## 2018-12-01 NOTE — Discharge Instructions (Signed)
Resume usual medications and diet as before. No driving for 24 hours. Next screening exam in 10 years.   Colonoscopy, Adult, Care After This sheet gives you information about how to care for yourself after your procedure. Your doctor may also give you more specific instructions. If you have problems or questions, call your doctor. What can I expect after the procedure? After the procedure, it is common to have:  A small amount of blood in your poop for 24 hours.  Some gas.  Mild cramping or bloating in your belly. Follow these instructions at home: General instructions  For the first 24 hours after the procedure: ? Do not drive or use machinery. ? Do not sign important documents. ? Do not drink alcohol. ? Do your daily activities more slowly than normal. ? Eat foods that are soft and easy to digest.  Take over-the-counter or prescription medicines only as told by your doctor. To help cramping and bloating:   Try walking around.  Put heat on your belly (abdomen) as told by your doctor. Use a heat source that your doctor recommends, such as a moist heat pack or a heating pad. ? Put a towel between your skin and the heat source. ? Leave the heat on for 20-30 minutes. ? Remove the heat if your skin turns bright red. This is especially important if you cannot feel pain, heat, or cold. You can get burned. Eating and drinking   Drink enough fluid to keep your pee (urine) clear or pale yellow.  Return to your normal diet as told by your doctor. Avoid heavy or fried foods that are hard to digest.  Avoid drinking alcohol for as long as told by your doctor. Contact a doctor if:  You have blood in your poop (stool) 2-3 days after the procedure. Get help right away if:  You have more than a small amount of blood in your poop.  You see large clumps of tissue (blood clots) in your poop.  Your belly is swollen.  You feel sick to your stomach (nauseous).  You throw up  (vomit).  You have a fever.  You have belly pain that gets worse, and medicine does not help your pain. Summary  After the procedure, it is common to have a small amount of blood in your poop. You may also have mild cramping and bloating in your belly.  For the first 24 hours after the procedure, do not drive or use machinery, do not sign important documents, and do not drink alcohol.  Get help right away if you have a lot of blood in your poop, feel sick to your stomach, have a fever, or have more belly pain. This information is not intended to replace advice given to you by your health care provider. Make sure you discuss any questions you have with your health care provider. Document Released: 02/15/2010 Document Revised: 11/13/2016 Document Reviewed: 10/08/2015 Elsevier Patient Education  2020 [employer].

## 2018-12-06 ENCOUNTER — Encounter (HOSPITAL_COMMUNITY): Payer: Self-pay | Admitting: Internal Medicine

## 2018-12-13 ENCOUNTER — Other Ambulatory Visit: Payer: Self-pay | Admitting: Family Medicine

## 2018-12-13 ENCOUNTER — Other Ambulatory Visit: Payer: Self-pay

## 2018-12-13 DIAGNOSIS — K219 Gastro-esophageal reflux disease without esophagitis: Secondary | ICD-10-CM

## 2018-12-13 NOTE — Patient Outreach (Signed)
[facility]) Care Management  12/13/2018  Broedy Mogg [facility] 05/13/1965 [id number]   Medication Adherence call to Mr. Masayuki Burghardt Telephone call to Patient regarding Medication Adherence unable to reach patient. Mr. Bregman did not answer patient is past due on Enalepril 2.5 mg under [facility].   [facility] Management Direct Dial [phone]  Fax [phone] [email or web address]

## 2018-12-27 ENCOUNTER — Other Ambulatory Visit: Payer: Self-pay

## 2018-12-27 ENCOUNTER — Ambulatory Visit (INDEPENDENT_AMBULATORY_CARE_PROVIDER_SITE_OTHER): Payer: Medicare Other | Admitting: Family Medicine

## 2018-12-27 ENCOUNTER — Encounter: Payer: Self-pay | Admitting: Family Medicine

## 2018-12-27 VITALS — BP 153/86 | HR 70 | Temp 97.4°F | Resp 20 | Ht 74.0 in | Wt 196.0 lb

## 2018-12-27 DIAGNOSIS — I10 Essential (primary) hypertension: Secondary | ICD-10-CM | POA: Diagnosis not present

## 2018-12-27 DIAGNOSIS — E785 Hyperlipidemia, unspecified: Secondary | ICD-10-CM | POA: Diagnosis not present

## 2018-12-27 DIAGNOSIS — H6123 Impacted cerumen, bilateral: Secondary | ICD-10-CM

## 2018-12-27 DIAGNOSIS — E1159 Type 2 diabetes mellitus with other circulatory complications: Secondary | ICD-10-CM

## 2018-12-27 DIAGNOSIS — I152 Hypertension secondary to endocrine disorders: Secondary | ICD-10-CM

## 2018-12-27 DIAGNOSIS — F3341 Major depressive disorder, recurrent, in partial remission: Secondary | ICD-10-CM | POA: Diagnosis not present

## 2018-12-27 DIAGNOSIS — E1169 Type 2 diabetes mellitus with other specified complication: Secondary | ICD-10-CM | POA: Diagnosis not present

## 2018-12-27 DIAGNOSIS — G5603 Carpal tunnel syndrome, bilateral upper limbs: Secondary | ICD-10-CM | POA: Diagnosis not present

## 2018-12-27 DIAGNOSIS — K219 Gastro-esophageal reflux disease without esophagitis: Secondary | ICD-10-CM

## 2018-12-27 DIAGNOSIS — M1A0720 Idiopathic chronic gout, left ankle and foot, without tophus (tophi): Secondary | ICD-10-CM

## 2018-12-27 DIAGNOSIS — F251 Schizoaffective disorder, depressive type: Secondary | ICD-10-CM

## 2018-12-27 DIAGNOSIS — E119 Type 2 diabetes mellitus without complications: Secondary | ICD-10-CM | POA: Diagnosis not present

## 2018-12-27 LAB — BAYER DCA HB A1C WAIVED: HB A1C (BAYER DCA - WAIVED): 6.8 % (ref <7.0)

## 2018-12-27 MED ORDER — ALLOPURINOL 300 MG PO TABS: 300.0000 mg | ORAL_TABLET | Freq: Every day | ORAL | 1 refills | Status: DC

## 2018-12-27 MED ORDER — METFORMIN HCL 500 MG PO TABS: 500.0000 mg | ORAL_TABLET | Freq: Every day | ORAL | 3 refills | Status: DC

## 2018-12-27 MED ORDER — ENALAPRIL MALEATE 2.5 MG PO TABS: 2.5000 mg | ORAL_TABLET | Freq: Every day | ORAL | 1 refills | Status: DC

## 2018-12-27 MED ORDER — HYDROCHLOROTHIAZIDE 25 MG PO TABS: 25.0000 mg | ORAL_TABLET | Freq: Every day | ORAL | 1 refills | Status: DC

## 2018-12-27 MED ORDER — ATORVASTATIN CALCIUM 20 MG PO TABS: 20.0000 mg | ORAL_TABLET | Freq: Every day | ORAL | 3 refills | Status: DC

## 2018-12-27 MED ORDER — EFFEXOR XR 75 MG PO CP24: 225.0000 mg | ORAL_CAPSULE | ORAL | 1 refills | Status: DC

## 2018-12-27 MED ORDER — AMLODIPINE BESYLATE 10 MG PO TABS: 10.0000 mg | ORAL_TABLET | Freq: Every day | ORAL | 1 refills | Status: DC

## 2018-12-27 MED ORDER — PANTOPRAZOLE SODIUM 40 MG PO TBEC: 40.0000 mg | DELAYED_RELEASE_TABLET | Freq: Every day | ORAL | 1 refills | Status: DC

## 2018-12-27 NOTE — Progress Notes (Signed)
Subjective:  Patient ID: Frank Moses, male    DOB: 05-23-1965, 53 y.o.   MRN: [id number]  Patient Care Team: Baruch Gouty, FNP as PCP - General (Family Medicine) Dione Housekeeper, MD (Family Medicine)   Chief Complaint:  Medical Management of Chronic Issues (3 mo ), Hyperlipidemia, Diabetes, and Hypertension   HPI: Frank Moses is a 53 y.o. male presenting on 12/27/2018 for Medical Management of Chronic Issues (3 mo ), Hyperlipidemia, Diabetes, and Hypertension   1. Type 2 diabetes mellitus without complication, with no history of insulin use (HCC) Pt presents for follow up evaluation of Type 2 diabetes mellitus.  Current symptoms include none. Patient denies foot ulcerations, hyperglycemia, hypoglycemia , increased appetite, nausea, paresthesia of the feet, polydipsia, polyuria, visual disturbances, vomiting and weight loss.  Current diabetic medications include metformin Compliant with meds - Yes  Current monitoring regimen: home blood tests - a few times weekly Home blood sugar records: trend: stable Any episodes of hypoglycemia? no  Known diabetic complications: nephropathy and cardiovascular disease Cardiovascular risk factors: advanced age (older than 40 for men, 1 for women), diabetes mellitus, dyslipidemia, hypertension, male gender, microalbuminuria and obesity (BMI >= 30 kg/m2) Eye exam current (within one year): yes Podiatry yearly?  No Weight trend: stable Current diet: in general, a "healthy" diet   Current exercise: walking  PNA Vaccine UTD?  Yes Hep B Vaccine?  Yes Tdap Vaccine UTD?  Yes Urine microalbumin UTD? Yes  Is He on ACE inhibitor or angiotensin II receptor blocker?  Yes, Enalapril Is He on statin? Yes atorvastatin Is He on ASA 81 mg daily?  No   2. Hyperlipidemia associated with type 2 diabetes mellitus ([place]) Compliant with medications - Yes Current medications - atorvastatin Side effects from medications - No Diet - generally healthy  Exercise - daily  Lab Results  Component Value Date   CHOL 182 08/23/2018   HDL 40 08/23/2018   LDLCALC 106 (H) 08/23/2018   TRIG 178 (H) 08/23/2018   CHOLHDL 4.6 08/23/2018     Family and personal medical history reviewed. Smoking and ETOH history reviewed.    3. Hypertension associated with type 2 diabetes mellitus ([place]) Complaint with meds - Yes Current Medications - Norvasc, enalapril, HCTZ Checking BP at home- No Exercising Regularly - Yes Watching Salt intake - Yes Pertinent ROS:  Headache - No Fatigue - No Visual Disturbances - No Chest pain - No Dyspnea - No Palpitations - No LE edema - No They report good compliance with medications and can restate their regimen by memory. No medication side effects.  Family, social, and smoking history reviewed.   BP Readings from Last 3 Encounters:  12/27/18 (!) 153/86  12/01/18 127/88  09/22/18 (!) 147/83   CMP Latest Ref Rng & Units 09/22/2018 08/23/2018 07/02/2018  Glucose 65 - 99 mg/dL 122(H) 114(H) 119(H)  BUN 6 - 24 mg/dL 11 12 7   Creatinine 0.76 - 1.27 mg/dL 0.92 0.96 0.84  Sodium 134 - 144 mmol/L 140 144 138  Potassium 3.5 - 5.2 mmol/L 4.3 4.8 3.1(L)  Chloride 96 - 106 mmol/L 99 102 103  CO2 20 - 29 mmol/L 24 24 25   Calcium 8.7 - 10.2 mg/dL 10.0 9.9 9.0  Total Protein 6.0 - 8.5 g/dL 7.7 7.4 6.9  Total Bilirubin 0.0 - 1.2 mg/dL 0.7 0.3 0.4  Alkaline Phos 39 - 117 IU/L 124(H) 121(H) 99  AST 0 - 40 IU/L 49(H) 35 36  ALT 0 - 44 IU/L  44 40 36      4. Gastroesophageal reflux disease without esophagitis Compliant with medications - Yes Current medications - pantoprazole Adverse side effects - No Cough - No Sore throat - No Voice change - No Hemoptysis - No Dysphagia or dyspepsia - No Water brash - No Red Flags (weight loss, hematochezia, melena, weight loss, early satiety, fevers, odynophagia, or persistent vomiting) - No   5. Recurrent major depressive disorder, in partial remission ([place]) 6.  Schizoaffective disorder, depressive type ([place]) Reports doing very well. Is followed by psychiatry. States mood has been great. No reported SI or HI. Taking medications as prescribed without associated side effects.   7. Chronic idiopathic gout involving toe of left foot without tophus Compliant with allopurinol. No recent issues with gout. No arthralgias or joint swelling.   8. Bilateral carpal tunnel syndrome Pt complains of bilateral wrist pain and tingling in his fingers. States this is intermittent. No known injury. States pain can be 5/10 at times. Has tried motrin with some relief of symptoms. No deformity or weakness, no loss of function. Has worn cock-up splints in the past with relief of symptoms. States this has been several years ago.      Relevant past medical, surgical, family, and social history reviewed and updated as indicated.  Allergies and medications reviewed and updated. Date reviewed: Chart in Epic.   Past Medical History:  Diagnosis Date  . Arthritis   . Asthma   . Bipolar 1 disorder ([place])   . Bipolar affective ([place])   . Depression   . Gout   . Hyperlipidemia   . Hypertension   . Peptic ulcer   . Schizophrenia ([place])   . Stroke [facility])     Past Surgical History:  Procedure Laterality Date  . bil foot surgery    . CERVICAL FUSION    . COLONOSCOPY N/A 12/01/2018   Procedure: COLONOSCOPY;  Surgeon: Rogene Houston, MD;  Location: [facility];  Service: Endoscopy;  Laterality: N/A;  830  . FOOT SURGERY    . HIP SURGERY Left   . NECK SURGERY     cyst removed from neck     Social History   Socioeconomic History  . Marital status: Single    Spouse name: Not on file  . Number of children: Not on file  . Years of education: Not on file  . Highest education level: Not on file  Occupational History  . Not on file  Social Needs  . Financial resource strain: Not on file  . Food insecurity    Worry: Not on file    Inability: Not on file  .  Transportation needs    Medical: Not on file    Non-medical: Not on file  Tobacco Use  . Smoking status: Current Every Day Smoker    Packs/day: 0.50  . Smokeless tobacco: Never Used  Substance and Sexual Activity  . Alcohol use: Yes    Comment: 3-6 cans of beer every 3-4 days  . Drug use: Never  . Sexual activity: Yes    Birth control/protection: None  Lifestyle  . Physical activity    Days per week: Not on file    Minutes per session: Not on file  . Stress: Not on file  Relationships  . Social Herbalist on phone: Not on file    Gets together: Not on file    Attends religious service: Not on file    Active member of club  or organization: Not on file    Attends meetings of clubs or organizations: Not on file    Relationship status: Not on file  . Intimate partner violence    Fear of current or ex partner: Not on file    Emotionally abused: Not on file    Physically abused: Not on file    Forced sexual activity: Not on file  Other Topics Concern  . Not on file  Social History Narrative   ** Merged History Encounter **        Outpatient Encounter Medications as of 12/27/2018  Medication Sig  . allopurinol (ZYLOPRIM) 300 MG tablet Take 1 tablet (300 mg total) by mouth daily.  Marland Kitchen amLODipine (NORVASC) 10 MG tablet Take 1 tablet (10 mg total) by mouth daily.  Marland Kitchen atorvastatin (LIPITOR) 20 MG tablet Take 1 tablet (20 mg total) by mouth daily.  . blood glucose meter kit and supplies Dispense based on patient and insurance preference. Use up to four times daily as directed. (FOR ICD-10 E10.9, E11.9).  Marland Kitchen diphenhydramine-acetaminophen (TYLENOL PM) 25-500 MG TABS tablet Take 1-2 tablets by mouth at bedtime as needed (for sleep).  . EFFEXOR XR 75 MG 24 hr capsule Take 3 capsules (225 mg total) by mouth every morning.  . enalapril (VASOTEC) 2.5 MG tablet Take 1 tablet (2.5 mg total) by mouth daily.  . hydrochlorothiazide (HYDRODIURIL) 25 MG tablet Take 1 tablet (25 mg total)  by mouth daily. For high blood pressure  . metFORMIN (GLUCOPHAGE) 500 MG tablet Take 1 tablet (500 mg total) by mouth daily with breakfast.  . pantoprazole (PROTONIX) 40 MG tablet Take 1 tablet (40 mg total) by mouth daily. For acid reflux  . [DISCONTINUED] allopurinol (ZYLOPRIM) 300 MG tablet Take 1 tablet (300 mg total) by mouth daily.  . [DISCONTINUED] amLODipine (NORVASC) 10 MG tablet Take 1 tablet (10 mg total) by mouth daily.  . [DISCONTINUED] atorvastatin (LIPITOR) 20 MG tablet Take 1 tablet (20 mg total) by mouth daily.  . [DISCONTINUED] EFFEXOR XR 75 MG 24 hr capsule Take 3 capsules (225 mg total) by mouth every morning.  . [DISCONTINUED] enalapril (VASOTEC) 2.5 MG tablet TAKE ONE TABLET BY MOUTH DAILY (Patient taking differently: Take 2.5 mg by mouth daily. )  . [DISCONTINUED] hydrochlorothiazide (HYDRODIURIL) 25 MG tablet Take 1 tablet (25 mg total) by mouth daily. For high blood pressure  . [DISCONTINUED] metFORMIN (GLUCOPHAGE) 500 MG tablet Take 1 tablet (500 mg total) by mouth daily with breakfast.  . [DISCONTINUED] pantoprazole (PROTONIX) 40 MG tablet Take 1 tablet (40 mg total) by mouth daily. For acid reflux   No facility-administered encounter medications on file as of 12/27/2018.     Allergies  Allergen Reactions  . Olanzapine Other (See Comments) and Itching    "Didn't agree with me"  . Orange Oil Other (See Comments)    Allergic to oranges -- Heartburn/indigestion  . Other     Steak - triggers gout flares   . Shrimp [Shellfish Allergy] Other (See Comments)    Triggers gout flare  . Tomato Other (See Comments)    Heartburn/indigestion  . Vicodin [Hydrocodone-Acetaminophen] Other (See Comments)    "Makes me sweat"  . Orange Fruit [Citrus]     Boil-like spots on skin  . Tomato Rash and Other (See Comments)    REACTION: Boil-like spots on skin    Review of Systems  Constitutional: Negative for activity change, appetite change, chills, diaphoresis, fatigue, fever  and unexpected weight change.  HENT: Negative.  Eyes: Negative.  Negative for photophobia and visual disturbance.  Respiratory: Negative for cough, chest tightness and shortness of breath.   Cardiovascular: Negative for chest pain, palpitations and leg swelling.  Gastrointestinal: Negative for abdominal distention, abdominal pain, anal bleeding, blood in stool, constipation, diarrhea, nausea, rectal pain and vomiting.  Endocrine: Negative.  Negative for cold intolerance, heat intolerance, polydipsia, polyphagia and polyuria.  Genitourinary: Negative for decreased urine volume, difficulty urinating, dysuria, frequency and urgency.  Musculoskeletal: Positive for arthralgias. Negative for myalgias.  Skin: Negative.  Negative for rash.  Allergic/Immunologic: Negative.   Neurological: Negative for dizziness, tremors, seizures, syncope, facial asymmetry, speech difficulty, weakness, light-headedness, numbness and headaches.  Hematological: Negative.   Psychiatric/Behavioral: Negative for agitation, behavioral problems, confusion, decreased concentration, dysphoric mood, hallucinations, self-injury, sleep disturbance and suicidal ideas. The patient is not nervous/anxious and is not hyperactive.   All other systems reviewed and are negative.       Objective:  BP (!) 153/86 (BP Location: Left Arm, Cuff Size: Large)   Pulse 70   Temp (!) 97.4 F (36.3 C)   Resp 20   Ht 6' 2"  (1.88 m)   Wt 196 lb (88.9 kg)   SpO2 98%   BMI 25.16 kg/m    Wt Readings from Last 3 Encounters:  12/27/18 196 lb (88.9 kg)  12/01/18 198 lb (89.8 kg)  09/22/18 193 lb (87.5 kg)    Physical Exam Vitals signs and nursing note reviewed.  Constitutional:      General: He is not in acute distress.    Appearance: Normal appearance. He is well-developed, well-groomed and overweight. He is not ill-appearing, toxic-appearing or diaphoretic.  HENT:     Head: Normocephalic and atraumatic.     Jaw: There is normal jaw  occlusion.     Right Ear: Hearing normal. There is impacted cerumen.     Left Ear: Hearing normal. There is impacted cerumen.     Nose: Nose normal.     Mouth/Throat:     Lips: Pink.     Mouth: Mucous membranes are moist.     Pharynx: Oropharynx is clear. Uvula midline.  Eyes:     General: Lids are normal.     Extraocular Movements: Extraocular movements intact.     Conjunctiva/sclera: Conjunctivae normal.     Pupils: Pupils are equal, round, and reactive to light.  Neck:     Musculoskeletal: Normal range of motion and neck supple.     Thyroid: No thyroid mass, thyromegaly or thyroid tenderness.     Vascular: No carotid bruit or JVD.     Trachea: Trachea and phonation normal.  Cardiovascular:     Rate and Rhythm: Normal rate and regular rhythm.     Chest Wall: PMI is not displaced.     Pulses: Normal pulses.     Heart sounds: Normal heart sounds. No murmur. No friction rub. No gallop.   Pulmonary:     Effort: Pulmonary effort is normal. No respiratory distress.     Breath sounds: Normal breath sounds. No wheezing.  Abdominal:     General: Bowel sounds are normal. There is no distension or abdominal bruit.     Palpations: Abdomen is soft. There is no hepatomegaly or splenomegaly.     Tenderness: There is no abdominal tenderness. There is no right CVA tenderness or left CVA tenderness.     Hernia: No hernia is present.  Musculoskeletal: Normal range of motion.     Right elbow: Normal.    Left  elbow: Normal.     Right wrist: He exhibits tenderness. He exhibits normal range of motion, no bony tenderness, no swelling, no effusion, no crepitus, no deformity and no laceration.     Left wrist: He exhibits tenderness. He exhibits normal range of motion, no bony tenderness, no swelling, no effusion, no crepitus, no deformity and no laceration.     Right hand: Normal. Normal sensation noted. Normal strength noted.     Left hand: He exhibits normal range of motion, no tenderness, no bony  tenderness, normal two-point discrimination, normal capillary refill, no deformity, no laceration and no swelling. Normal sensation noted. Normal strength noted.     Right lower leg: No edema.     Left lower leg: No edema.     Comments: Positive Phalen's test bilaterally.  Lymphadenopathy:     Cervical: No cervical adenopathy.  Skin:    General: Skin is warm and dry.     Capillary Refill: Capillary refill takes less than 2 seconds.     Coloration: Skin is not cyanotic, jaundiced or pale.     Findings: No rash.  Neurological:     General: No focal deficit present.     Mental Status: He is alert and oriented to person, place, and time.     Cranial Nerves: Cranial nerves are intact. No cranial nerve deficit.     Sensory: Sensation is intact. No sensory deficit.     Motor: Motor function is intact. No weakness.     Coordination: Coordination is intact. Coordination normal.     Gait: Gait is intact. Gait normal.     Deep Tendon Reflexes: Reflexes are normal and symmetric. Reflexes normal.  Psychiatric:        Attention and Perception: Attention and perception normal.        Mood and Affect: Mood and affect normal.        Speech: Speech normal.        Behavior: Behavior normal. Behavior is cooperative.        Thought Content: Thought content normal.        Cognition and Memory: Cognition and memory normal.        Judgment: Judgment normal.      Results for orders placed or performed during the hospital encounter of 12/01/18  Glucose, capillary  Result Value Ref Range   Glucose-Capillary 129 (H) 70 - 99 mg/dL     Ear Cerumen Removal  Date/Time: 12/27/2018 10:17 AM Performed by: Baruch Gouty, FNP Authorized by: Baruch Gouty, FNP   Anesthesia: Local Anesthetic: none Location details: left ear and right ear Patient tolerance: patient tolerated the procedure well with no immediate complications Comments: Bilateral TM's pearly gray without bulging, erythema, or perforation post  cerumen removal.  Procedure type: irrigation  Sedation: Patient sedated: no      Pertinent labs & imaging results that were available during my care of the patient were reviewed by me and considered in my medical decision making.  Assessment & Plan:  Marquavious was seen today for medical management of chronic issues, hyperlipidemia, diabetes and hypertension.  Diagnoses and all orders for this visit:  Type 2 diabetes mellitus without complication, with no history of insulin use (HCC) A1C 6.8 today. No changes in regimen. Keep up the good work.  -     CBC with Differential/Platelet -     Bayer DCA Hb A1c Waived -     metFORMIN (GLUCOPHAGE) 500 MG tablet; Take 1 tablet (500 mg total) by  mouth daily with breakfast.  Hyperlipidemia associated with type 2 diabetes mellitus ([place]) Diet encouraged - increase intake of fresh fruits and vegetables, increase intake of lean proteins. Bake, broil, or grill foods. Avoid fried, greasy, and fatty foods. Avoid fast foods. Increase intake of fiber-rich whole grains. Exercise encouraged - at least 150 minutes per week and advance as tolerated.  Goal BMI < 25. Continue medications as prescribed. Follow up in 3-6 months as discussed.  -     CBC with Differential/Platelet -     Lipid panel -     atorvastatin (LIPITOR) 20 MG tablet; Take 1 tablet (20 mg total) by mouth daily.  Hypertension associated with type 2 diabetes mellitus (HCC) BP poorly controlled. Changes were not made in regimen today, pt states he has not taken his medication in 2 days because he was out. States it is generally well controlled. Goal BP is 130/80. Pt aware to report any persistent high or low readings. DASH diet and exercise encouraged. Exercise at least 150 minutes per week and increase as tolerated. Goal BMI > 25. Stress management encouraged. Avoid nicotine and tobacco product use. Avoid excessive alcohol and NSAID's. Avoid more than 2000 mg of sodium daily. Medications as  prescribed. Follow up as scheduled.  -     CBC with Differential/Platelet -     CMP14+EGFR -     amLODipine (NORVASC) 10 MG tablet; Take 1 tablet (10 mg total) by mouth daily. -     enalapril (VASOTEC) 2.5 MG tablet; Take 1 tablet (2.5 mg total) by mouth daily. -     hydrochlorothiazide (HYDRODIURIL) 25 MG tablet; Take 1 tablet (25 mg total) by mouth daily. For high blood pressure  Gastroesophageal reflux disease without esophagitis No red flags present. Diet discussed. Avoid fried, spicy, fatty, greasy, and acidic foods. Avoid caffeine, nicotine, and alcohol. Do not eat 2-3 hours before bedtime and stay upright for at least 1-2 hours after eating. Eat small frequent meals. Avoid NSAID's like motrin and aleve. Medications as prescribed. Report any new or worsening symptoms. Follow up as discussed or sooner if needed.   -     pantoprazole (PROTONIX) 40 MG tablet; Take 1 tablet (40 mg total) by mouth daily. For acid reflux  Recurrent major depressive disorder, in partial remission ([place]) Doing well with current medications. Continue follow ups with psychiatry. Continue below.  -     EFFEXOR XR 75 MG 24 hr capsule; Take 3 capsules (225 mg total) by mouth every morning.  Schizoaffective disorder, depressive type ([place]) Followed by psychiatry.keep upcoming appointments.   Chronic idiopathic gout involving toe of left foot without tophus Well controlled, continue below.  -     allopurinol (ZYLOPRIM) 300 MG tablet; Take 1 tablet (300 mg total) by mouth daily.  Bilateral carpal tunnel syndrome Bilateral cock-up splints applied in office. Pt aware to take Tylenol, not NSAIDs, due to renal decline. Report any new or worsening symptoms. If persistent despite symptomatic care, will refer to ortho.   Bilateral impacted cerumen Ear wax removed in office via lavage. Pt tolerated well. Discussed preventative measures to prevent recurrence.  -     Ear Cerumen Removal  Total time spent with patient 45  mintues.  Greater than 50% of encounter spent in coordination of care/counseling.   Continue all other maintenance medications.  Follow up plan: Return in about 3 months (around 03/27/2019), or if symptoms worsen or fail to improve, for DM, HTN.  Continue healthy lifestyle choices, including diet (rich  in fruits, vegetables, and lean proteins, and low in salt and simple carbohydrates) and exercise (at least 30 minutes of moderate physical activity daily).  Educational handout given for DASH, carpal tunnel   The above assessment and management plan was discussed with the patient. The patient verbalized understanding of and has agreed to the management plan. Patient is aware to call the clinic if they develop any new symptoms or if symptoms persist or worsen. Patient is aware when to return to the clinic for a follow-up visit. Patient educated on when it is appropriate to go to the emergency department.   Monia Pouch, FNP-C [facility] [phone]

## 2018-12-27 NOTE — Patient Instructions (Addendum)
DASH Eating Plan DASH stands for "Dietary Approaches to Stop Hypertension." The DASH eating plan is a healthy eating plan that has been shown to reduce high blood pressure (hypertension). Additional health benefits may include reducing the risk of type 2 diabetes mellitus, heart disease, and stroke. The DASH eating plan may also help with weight loss.  WHAT DO I NEED TO KNOW ABOUT THE DASH EATING PLAN? For the DASH eating plan, you will follow these general guidelines:  Choose foods with a percent daily value for sodium of less than 5% (as listed on the food label).  Use salt-free seasonings or herbs instead of table salt or sea salt.  Check with your health care provider or pharmacist before using salt substitutes.  Eat lower-sodium products, often labeled as "lower sodium" or "no salt added."  Eat fresh foods.  Eat more vegetables, fruits, and low-fat dairy products.  Choose whole grains. Look for the word "whole" as the first word in the ingredient list.  Choose fish and skinless chicken or turkey more often than red meat. Limit fish, poultry, and meat to 6 oz (170 g) each day.  Limit sweets, desserts, sugars, and sugary drinks.  Choose heart-healthy fats.  Limit cheese to 1 oz (28 g) per day.  Eat more home-cooked food and less restaurant, buffet, and fast food.  Limit fried foods.  Cook foods using methods other than frying.  Limit canned vegetables. If you do use them, rinse them well to decrease the sodium.  When eating at a restaurant, ask that your food be prepared with less salt, or no salt if possible.  WHAT FOODS CAN I EAT? Seek help from a dietitian for individual calorie needs.  Grains Whole grain or whole wheat bread. Brown rice. Whole grain or whole wheat pasta. Quinoa, bulgur, and whole grain cereals. Low-sodium cereals. Corn or whole wheat flour tortillas. Whole grain cornbread. Whole grain crackers. Low-sodium crackers.  Vegetables Fresh or frozen  vegetables (raw, steamed, roasted, or grilled). Low-sodium or reduced-sodium tomato and vegetable juices. Low-sodium or reduced-sodium tomato sauce and paste. Low-sodium or reduced-sodium canned vegetables.   Fruits All fresh, canned (in natural juice), or frozen fruits.  Meat and Other Protein Products Ground beef (85% or leaner), grass-fed beef, or beef trimmed of fat. Skinless chicken or turkey. Ground chicken or turkey. Pork trimmed of fat. All fish and seafood. Eggs. Dried beans, peas, or lentils. Unsalted nuts and seeds. Unsalted canned beans.  Dairy Low-fat dairy products, such as skim or 1% milk, 2% or reduced-fat cheeses, low-fat ricotta or cottage cheese, or plain low-fat yogurt. Low-sodium or reduced-sodium cheeses.  Fats and Oils Tub margarines without trans fats. Light or reduced-fat mayonnaise and salad dressings (reduced sodium). Avocado. Safflower, olive, or canola oils. Natural peanut or almond butter.  Other Unsalted popcorn and pretzels. The items listed above may not be a complete list of recommended foods or beverages. Contact your dietitian for more options.  WHAT FOODS ARE NOT RECOMMENDED?  Grains White bread. White pasta. White rice. Refined cornbread. Bagels and croissants. Crackers that contain trans fat.  Vegetables Creamed or fried vegetables. Vegetables in a cheese sauce. Regular canned vegetables. Regular canned tomato sauce and paste. Regular tomato and vegetable juices.  Fruits Dried fruits. Canned fruit in light or heavy syrup. Fruit juice.  Meat and Other Protein Products Fatty cuts of meat. Ribs, chicken wings, bacon, sausage, bologna, salami, chitterlings, fatback, hot dogs, bratwurst, and packaged luncheon meats. Salted nuts and seeds. Canned beans with salt.    Dairy Whole or 2% milk, cream, half-and-half, and cream cheese. Whole-fat or sweetened yogurt. Full-fat cheeses or blue cheese. Nondairy creamers and whipped toppings. Processed cheese,  cheese spreads, or cheese curds.  Condiments Onion and garlic salt, seasoned salt, table salt, and sea salt. Canned and packaged gravies. Worcestershire sauce. Tartar sauce. Barbecue sauce. Teriyaki sauce. Soy sauce, including reduced sodium. Steak sauce. Fish sauce. Oyster sauce. Cocktail sauce. Horseradish. Ketchup and mustard. Meat flavorings and tenderizers. Bouillon cubes. Hot sauce. Tabasco sauce. Marinades. Taco seasonings. Relishes.  Fats and Oils Butter, stick margarine, lard, shortening, ghee, and bacon fat. Coconut, palm kernel, or palm oils. Regular salad dressings.  Other Pickles and olives. Salted popcorn and pretzels.  The items listed above may not be a complete list of foods and beverages to avoid. Contact your dietitian for more information.  WHERE CAN I FIND MORE INFORMATION? National Heart, Lung, and Blood Institute: travelstabloid.com Document Released: 01/02/2011 Document Revised: 05/30/2013 Document Reviewed: 11/17/2012 [facility] Patient Information 2015 [employer], [place]. This information is not intended to replace advice given to you by your health care provider. Make sure you discuss any questions you have with your health care provider.   I think that you would greatly benefit from seeing a nutritionist.  If you are interested, please call Dr Jenne Campus at [phone] to schedule an appointment.   Carpal Tunnel Syndrome  Carpal tunnel syndrome is a condition that causes pain in your hand and arm. The carpal tunnel is a narrow area that is on the palm side of your wrist. Repeated wrist motion or certain diseases may cause swelling in the tunnel. This swelling can pinch the main nerve in the wrist (median nerve). What are the causes? This condition may be caused by:  Repeated wrist motions.  Wrist injuries.  Arthritis.  A sac of fluid (cyst) or abnormal growth (tumor) in the carpal tunnel.  Fluid buildup during  pregnancy. Sometimes the cause is not known. What increases the risk? The following factors may make you more likely to develop this condition:  Having a job in which you move your wrist in the same way many times. This includes jobs like being a Software engineer or a Scientist, water quality.  Being a woman.  Having other health conditions, such as: ? Diabetes. ? Obesity. ? A thyroid gland that is not active enough (hypothyroidism). ? Kidney failure. What are the signs or symptoms? Symptoms of this condition include:  A tingling feeling in your fingers.  Tingling or a loss of feeling (numbness) in your hand.  Pain in your entire arm. This pain may get worse when you bend your wrist and elbow for a long time.  Pain in your wrist that goes up your arm to your shoulder.  Pain that goes down into your palm or fingers.  A weak feeling in your hands. You may find it hard to grab and hold items. You may feel worse at night. How is this diagnosed? This condition is diagnosed with a medical history and physical exam. You may also have tests, such as:  Electromyogram (EMG). This test checks the signals that the nerves send to the muscles.  Nerve conduction study. This test checks how well signals pass through your nerves.  Imaging tests, such as X-rays, ultrasound, and MRI. These tests check for what might be the cause of your condition. How is this treated? This condition may be treated with:  Lifestyle changes. You will be asked to stop or change the activity that caused your problem.  Doing exercise and activities that make bones and muscles stronger (physical therapy).  Learning how to use your hand again (occupational therapy).  Medicines for pain and swelling (inflammation). You may have injections in your wrist.  A wrist splint.  Surgery. Follow these instructions at home: If you have a splint:  Wear the splint as told by your doctor. Remove it only as told by your doctor.  Loosen the  splint if your fingers: ? Tingle. ? Lose feeling (become numb). ? Turn cold and blue.  Keep the splint clean.  If the splint is not waterproof: ? Do not let it get wet. ? Cover it with a watertight covering when you take a bath or a shower. Managing pain, stiffness, and swelling   If told, put ice on the painful area: ? If you have a removable splint, remove it as told by your doctor. ? Put ice in a plastic bag. ? Place a towel between your skin and the bag. ? Leave the ice on for 20 minutes, 2-3 times per day. General instructions  Take over-the-counter and prescription medicines only as told by your doctor.  Rest your wrist from any activity that may cause pain. If needed, talk with your boss at work about changes that can help your wrist heal.  Do any exercises as told by your doctor, physical therapist, or occupational therapist.  Keep all follow-up visits as told by your doctor. This is important. Contact a doctor if:  You have new symptoms.  Medicine does not help your pain.  Your symptoms get worse. Get help right away if:  You have very bad numbness or tingling in your wrist or hand. Summary  Carpal tunnel syndrome is a condition that causes pain in your hand and arm.  It is often caused by repeated wrist motions.  Lifestyle changes and medicines are used to treat this problem. Surgery may help in very bad cases.  Follow your doctor's instructions about wearing a splint, resting your wrist, keeping follow-up visits, and calling for help. This information is not intended to replace advice given to you by your health care provider. Make sure you discuss any questions you have with your health care provider. Document Released: 01/02/2011 Document Revised: 05/22/2017 Document Reviewed: 05/22/2017 Elsevier Patient Education  2020 [employer].

## 2018-12-28 LAB — CBC WITH DIFFERENTIAL/PLATELET
Basophils Absolute: 0.1 10*3/uL (ref 0.0–0.2)
Basos: 1 %
EOS (ABSOLUTE): 0.3 10*3/uL (ref 0.0–0.4)
Eos: 4 %
Hematocrit: 45.2 % (ref 37.5–51.0)
Hemoglobin: 15.6 g/dL (ref 13.0–17.7)
Immature Grans (Abs): 0 10*3/uL (ref 0.0–0.1)
Immature Granulocytes: 0 %
Lymphocytes Absolute: 1.9 10*3/uL (ref 0.7–3.1)
Lymphs: 29 %
MCH: 31.3 pg (ref 26.6–33.0)
MCHC: 34.5 g/dL (ref 31.5–35.7)
MCV: 91 fL (ref 79–97)
Monocytes Absolute: 0.6 10*3/uL (ref 0.1–0.9)
Monocytes: 9 %
Neutrophils Absolute: 3.8 10*3/uL (ref 1.4–7.0)
Neutrophils: 57 %
Platelets: 253 10*3/uL (ref 150–450)
RBC: 4.98 x10E6/uL (ref 4.14–5.80)
RDW: 13.1 % (ref 11.6–15.4)
WBC: 6.7 10*3/uL (ref 3.4–10.8)

## 2018-12-28 LAB — LIPID PANEL
Chol/HDL Ratio: 4.3 ratio (ref 0.0–5.0)
Cholesterol, Total: 187 mg/dL (ref 100–199)
HDL: 44 mg/dL (ref >39)
LDL Chol Calc (NIH): 100 mg/dL — ABNORMAL HIGH (ref 0–99)
Triglycerides: 252 mg/dL — ABNORMAL HIGH (ref 0–149)
VLDL Cholesterol Cal: 43 mg/dL — ABNORMAL HIGH (ref 5–40)

## 2018-12-28 LAB — CMP14+EGFR
ALT: 36 IU/L (ref 0–44)
AST: 29 IU/L (ref 0–40)
Albumin/Globulin Ratio: 2 (ref 1.2–2.2)
Albumin: 4.7 g/dL (ref 3.8–4.9)
Alkaline Phosphatase: 108 IU/L (ref 39–117)
BUN/Creatinine Ratio: 14 (ref 9–20)
BUN: 12 mg/dL (ref 6–24)
Bilirubin Total: 0.5 mg/dL (ref 0.0–1.2)
CO2: 20 mmol/L (ref 20–29)
Calcium: 9.6 mg/dL (ref 8.7–10.2)
Chloride: 101 mmol/L (ref 96–106)
Creatinine, Ser: 0.84 mg/dL (ref 0.76–1.27)
GFR calc Af Amer: 116 mL/min/{1.73_m2} (ref >59)
GFR calc non Af Amer: 100 mL/min/{1.73_m2} (ref >59)
Globulin, Total: 2.3 g/dL (ref 1.5–4.5)
Glucose: 141 mg/dL — ABNORMAL HIGH (ref 65–99)
Potassium: 4.3 mmol/L (ref 3.5–5.2)
Sodium: 143 mmol/L (ref 134–144)
Total Protein: 7 g/dL (ref 6.0–8.5)

## 2018-12-29 ENCOUNTER — Telehealth: Payer: Self-pay | Admitting: *Deleted

## 2018-12-29 DIAGNOSIS — F3341 Major depressive disorder, recurrent, in partial remission: Secondary | ICD-10-CM

## 2018-12-29 MED ORDER — VENLAFAXINE HCL ER 75 MG PO CP24: 225.0000 mg | ORAL_CAPSULE | Freq: Every day | ORAL | 1 refills | Status: DC

## 2018-12-29 NOTE — Telephone Encounter (Signed)
He can have generic

## 2018-12-29 NOTE — Telephone Encounter (Signed)
Effexor XR 75mg  Caps-Did you mean to send rx for brand name only or can pt have generic? If brand name only reason? I got a PA request for it.

## 2019-01-13 ENCOUNTER — Other Ambulatory Visit: Payer: Self-pay

## 2019-01-13 NOTE — Patient Outreach (Signed)
[facility]) Care Management  01/13/2019  Quentine Ragin [facility] 09-16-1965 [id number]   Medication Adherence call to Mr. Frank Moses HIPPA Compliant Voice message left with a call back number. Frank Moses is showing past due on Enalepril 2.5 mg under [place].   [facility] Management Direct Dial [phone]  Fax [phone] [email or web address]

## 2019-03-16 ENCOUNTER — Telehealth: Payer: Self-pay | Admitting: Family Medicine

## 2019-03-16 NOTE — Chronic Care Management (AMB) (Signed)
  Chronic Care Management   Outreach Note  03/16/2019 Name: QUENCY KITZINGER MRN: [id number] DOB: 02-26-1965  [name] is a 54 y.o. year old male who is a primary care patient of Rakes, Connye Burkitt, FNP. I reached out to [name] by phone today in response to a referral sent by Mr. Cordario Braude [facility] health plan.     An unsuccessful telephone outreach was attempted today. The patient was referred to the case management team for assistance with care management and care coordination.   Follow Up Plan: The care management team will reach out to the patient again over the next 7 days.   Noreene Larsson, [address], [place], [place] [address] Direct Dial: [phone] [email or web address] Website: [email or web address]

## 2019-03-21 NOTE — Chronic Care Management (AMB) (Signed)
°  Chronic Care Management   Outreach Note  03/21/2019 Name: LAURITZ TUNGATE MRN: [id number] DOB: [birth year]  [name] is a 54 y.o. year old male who is a primary care patient of Rakes, Connye Burkitt, FNP. I reached out to [name] by phone today in response to a referral sent by Mr. Jathniel Polinsky [facility] health plan.     A second unsuccessful telephone outreach was attempted today. The patient was referred to the case management team for assistance with care management and care coordination.   Follow Up Plan: A HIPPA compliant phone message was left for the patient providing contact information and requesting a return call.  The care management team will reach out to the patient again over the next 7 days.  If patient returns call to provider office, please advise to call [facility] [place], [place], [place], [place] [address] Direct Dial: [phone] [email or web address] Website: [email or web address]

## 2019-03-22 NOTE — Chronic Care Management (AMB) (Signed)
  Chronic Care Management   Outreach Note  03/22/2019 Name: Frank Moses MRN: [id number] DOB: 1965/06/21  [name] is a 54 y.o. year old male who is a primary care patient of Rakes, Connye Burkitt, FNP. I reached out to [name] by phone today in response to a referral sent by Mr. Ranald Batz [facility] health plan.     Third unsuccessful telephone outreach was attempted today. The patient was referred to the case management team for assistance with care management and care coordination. The patient's primary care provider has been notified of our unsuccessful attempts to make or maintain contact with the patient. The care management team is pleased to engage with this patient at any time in the future should he/she be interested in assistance from the care management team.   Follow Up Plan: The care management team is available to follow up with the patient after provider conversation with the patient regarding recommendation for care management engagement and subsequent re-referral to the care management team.   Noreene Larsson, [place], [place], [place] [address] Direct Dial: [phone] [email or web address] Website: [email or web address]

## 2019-03-30 ENCOUNTER — Other Ambulatory Visit: Payer: Self-pay

## 2019-03-30 ENCOUNTER — Ambulatory Visit (INDEPENDENT_AMBULATORY_CARE_PROVIDER_SITE_OTHER): Payer: Medicare Other | Admitting: Family Medicine

## 2019-03-30 ENCOUNTER — Encounter: Payer: Self-pay | Admitting: Family Medicine

## 2019-03-30 DIAGNOSIS — I10 Essential (primary) hypertension: Secondary | ICD-10-CM

## 2019-03-30 DIAGNOSIS — E1129 Type 2 diabetes mellitus with other diabetic kidney complication: Secondary | ICD-10-CM

## 2019-03-30 DIAGNOSIS — E119 Type 2 diabetes mellitus without complications: Secondary | ICD-10-CM | POA: Diagnosis not present

## 2019-03-30 DIAGNOSIS — E785 Hyperlipidemia, unspecified: Secondary | ICD-10-CM | POA: Diagnosis not present

## 2019-03-30 DIAGNOSIS — E1159 Type 2 diabetes mellitus with other circulatory complications: Secondary | ICD-10-CM

## 2019-03-30 DIAGNOSIS — E1169 Type 2 diabetes mellitus with other specified complication: Secondary | ICD-10-CM

## 2019-03-30 DIAGNOSIS — R809 Proteinuria, unspecified: Secondary | ICD-10-CM | POA: Diagnosis not present

## 2019-03-30 LAB — BAYER DCA HB A1C WAIVED: HB A1C (BAYER DCA - WAIVED): 7.7 % — ABNORMAL HIGH (ref <7.0)

## 2019-03-30 NOTE — Progress Notes (Signed)
Subjective:  Patient ID: Frank Moses, male    DOB: 1965-12-24, 54 y.o.   MRN: [id number]  Patient Care Team: Baruch Gouty, FNP as PCP - General (Family Medicine) Dione Housekeeper, MD (Family Medicine)   Chief Complaint:  Medical Management of Chronic Issues   HPI: Frank Moses is a 54 y.o. male presenting on 03/30/2019 for Medical Management of Chronic Issues   1. Type 2 diabetes mellitus without complication, with no history of insulin use ([place]) Has been taking medications as prescribed. States he did indulge over the holidays and knows his A1C will be elevated today. No polyuria, polydipsia, or polyphagia.   2. Hyperlipidemia associated with type 2 diabetes mellitus ([place]) Compliant with medications without associated side effects. Walks daily. Does not follow a strict diet.   3. Hypertension associated with type 2 diabetes mellitus ([place]) Compliant with medications. No adverse side effects. No chest pain, palpitations, headaches, confusion, weakness, or visual changes. Does walk daily. Does not follow a strict diet.   4. Microalbuminuria due to type 2 diabetes mellitus (HCC) No changes in urine output, edema, weakness, fatigue, or confusion. No changes in urine color.      Relevant past medical, surgical, family, and social history reviewed and updated as indicated.  Allergies and medications reviewed and updated. Date reviewed: Chart in Epic.   Past Medical History:  Diagnosis Date  . Arthritis   . Asthma   . Bipolar 1 disorder ([place])   . Bipolar affective ([place])   . Depression   . Gout   . Hyperlipidemia   . Hypertension   . Peptic ulcer   . Schizophrenia ([place])   . Stroke [facility])     Past Surgical History:  Procedure Laterality Date  . bil foot surgery    . CERVICAL FUSION    . COLONOSCOPY N/A 12/01/2018   Procedure: COLONOSCOPY;  Surgeon: Rogene Houston, MD;  Location: [facility];  Service: Endoscopy;  Laterality: N/A;  830  . FOOT SURGERY    . HIP  SURGERY Left   . NECK SURGERY     cyst removed from neck     Social History   Socioeconomic History  . Marital status: Single    Spouse name: Not on file  . Number of children: Not on file  . Years of education: Not on file  . Highest education level: Not on file  Occupational History  . Not on file  Tobacco Use  . Smoking status: Current Every Day Smoker    Packs/day: 0.50  . Smokeless tobacco: Never Used  Substance and Sexual Activity  . Alcohol use: Yes    Comment: 3-6 cans of beer every 3-4 days  . Drug use: Never  . Sexual activity: Yes    Birth control/protection: None  Other Topics Concern  . Not on file  Social History Narrative   ** Merged History Encounter **       Social Determinants of Health   Financial Resource Strain:   . Difficulty of Paying Living Expenses: Not on file  Food Insecurity:   . Worried About Charity fundraiser in the Last Year: Not on file  . Ran Out of Food in the Last Year: Not on file  Transportation Needs:   . Lack of Transportation (Medical): Not on file  . Lack of Transportation (Non-Medical): Not on file  Physical Activity:   . Days of Exercise per Week: Not on file  . Minutes of  Exercise per Session: Not on file  Stress:   . Feeling of Stress : Not on file  Social Connections:   . Frequency of Communication with Friends and Family: Not on file  . Frequency of Social Gatherings with Friends and Family: Not on file  . Attends Religious Services: Not on file  . Active Member of Clubs or Organizations: Not on file  . Attends Archivist Meetings: Not on file  . Marital Status: Not on file  Intimate Partner Violence:   . Fear of Current or Ex-Partner: Not on file  . Emotionally Abused: Not on file  . Physically Abused: Not on file  . Sexually Abused: Not on file    Outpatient Encounter Medications as of 03/30/2019  Medication Sig  . allopurinol (ZYLOPRIM) 300 MG tablet Take 1 tablet (300 mg total) by mouth daily.    Marland Kitchen amLODipine (NORVASC) 10 MG tablet Take 1 tablet (10 mg total) by mouth daily.  Marland Kitchen atorvastatin (LIPITOR) 20 MG tablet Take 1 tablet (20 mg total) by mouth daily.  . blood glucose meter kit and supplies Dispense based on patient and insurance preference. Use up to four times daily as directed. (FOR ICD-10 E10.9, E11.9).  . enalapril (VASOTEC) 2.5 MG tablet Take 1 tablet (2.5 mg total) by mouth daily.  . hydrochlorothiazide (HYDRODIURIL) 25 MG tablet Take 1 tablet (25 mg total) by mouth daily. For high blood pressure  . metFORMIN (GLUCOPHAGE) 500 MG tablet Take 1 tablet (500 mg total) by mouth daily with breakfast.  . pantoprazole (PROTONIX) 40 MG tablet Take 1 tablet (40 mg total) by mouth daily. For acid reflux  . traZODone (DESYREL) 100 MG tablet Take 100 mg by mouth at bedtime.  Marland Kitchen venlafaxine XR (EFFEXOR-XR) 75 MG 24 hr capsule Take 3 capsules (225 mg total) by mouth daily with breakfast.  . [DISCONTINUED] diphenhydramine-acetaminophen (TYLENOL PM) 25-500 MG TABS tablet Take 1-2 tablets by mouth at bedtime as needed (for sleep).   No facility-administered encounter medications on file as of 03/30/2019.    Allergies  Allergen Reactions  . Olanzapine Other (See Comments) and Itching    "Didn't agree with me"  . Orange Oil Other (See Comments)    Allergic to oranges -- Heartburn/indigestion  . Other     Steak - triggers gout flares   . Shrimp [Shellfish Allergy] Other (See Comments)    Triggers gout flare  . Tomato Other (See Comments)    Heartburn/indigestion  . Vicodin [Hydrocodone-Acetaminophen] Other (See Comments)    "Makes me sweat"  . Orange Fruit [Citrus]     Boil-like spots on skin  . Tomato Rash and Other (See Comments)    REACTION: Boil-like spots on skin    Review of Systems  Constitutional: Negative for activity change, appetite change, chills, diaphoresis, fatigue, fever and unexpected weight change.  HENT: Negative.   Eyes: Negative.  Negative for photophobia and  visual disturbance.  Respiratory: Negative for cough, chest tightness and shortness of breath.   Cardiovascular: Negative for chest pain, palpitations and leg swelling.  Gastrointestinal: Negative for abdominal distention, abdominal pain, blood in stool, constipation, diarrhea, nausea and vomiting.  Endocrine: Negative.  Negative for cold intolerance, heat intolerance, polydipsia, polyphagia and polyuria.  Genitourinary: Negative for decreased urine volume, difficulty urinating, dysuria, frequency and urgency.  Musculoskeletal: Negative for arthralgias and myalgias.  Skin:       Mole to face, bothersome at times  Allergic/Immunologic: Negative.   Neurological: Negative for dizziness, tremors, seizures, syncope, facial  asymmetry, speech difficulty, weakness, light-headedness, numbness and headaches.  Hematological: Negative.   Psychiatric/Behavioral: Negative for confusion, hallucinations, sleep disturbance and suicidal ideas.  All other systems reviewed and are negative.       Objective:  BP (!) 148/76 (Cuff Size: Normal)   Pulse 100   Temp 98.2 F (36.8 C) (Oral)   Ht _0  (1.88 m)   Wt 199 lb 6 oz (90.4 kg)   BMI 25.60 kg/m    Wt Readings from Last 3 Encounters:  03/30/19 199 lb 6 oz (90.4 kg)  12/27/18 196 lb (88.9 kg)  12/01/18 198 lb (89.8 kg)    Physical Exam Vitals and nursing note reviewed.  Constitutional:      General: He is not in acute distress.    Appearance: Normal appearance. He is well-developed and well-groomed. He is not ill-appearing, toxic-appearing or diaphoretic.  HENT:     Head: Normocephalic and atraumatic.     Jaw: There is normal jaw occlusion.     Right Ear: Hearing normal.     Left Ear: Hearing normal.     Nose: Nose normal.     Mouth/Throat:     Lips: Pink.     Mouth: Mucous membranes are moist.     Pharynx: Oropharynx is clear. Uvula midline.  Eyes:     General: Lids are normal.     Extraocular Movements: Extraocular movements intact.       Conjunctiva/sclera: Conjunctivae normal.     Pupils: Pupils are equal, round, and reactive to light.  Neck:     Thyroid: No thyroid mass, thyromegaly or thyroid tenderness.     Vascular: No carotid bruit or JVD.     Trachea: Trachea and phonation normal.  Cardiovascular:     Rate and Rhythm: Normal rate and regular rhythm.     Chest Wall: PMI is not displaced.     Pulses: Normal pulses.     Heart sounds: Normal heart sounds. No murmur. No friction rub. No gallop.   Pulmonary:     Effort: Pulmonary effort is normal. No respiratory distress.     Breath sounds: Normal breath sounds. No wheezing.  Abdominal:     General: Bowel sounds are normal. There is no distension or abdominal bruit.     Palpations: Abdomen is soft. There is no hepatomegaly or splenomegaly.     Tenderness: There is no abdominal tenderness. There is no right CVA tenderness or left CVA tenderness.     Hernia: No hernia is present.  Musculoskeletal:        General: Normal range of motion.     Cervical back: Normal range of motion and neck supple.     Right lower leg: No edema.     Left lower leg: No edema.  Lymphadenopathy:     Cervical: No cervical adenopathy.  Skin:    General: Skin is warm and dry.     Capillary Refill: Capillary refill takes less than 2 seconds.     Coloration: Skin is not cyanotic, jaundiced or pale.     Findings: Lesion present. No rash.       Neurological:     General: No focal deficit present.     Mental Status: He is alert and oriented to person, place, and time.     Cranial Nerves: Cranial nerves are intact. No cranial nerve deficit.     Sensory: Sensation is intact. No sensory deficit.     Motor: Motor function is intact. No weakness.  Coordination: Coordination is intact. Coordination normal.     Gait: Gait is intact. Gait normal.     Deep Tendon Reflexes: Reflexes are normal and symmetric. Reflexes normal.  Psychiatric:        Attention and Perception: Attention and  perception normal.        Mood and Affect: Mood and affect normal.        Speech: Speech normal.        Behavior: Behavior normal. Behavior is cooperative.        Thought Content: Thought content normal.        Cognition and Memory: Cognition and memory normal.        Judgment: Judgment normal.     Results for orders placed or performed in visit on 12/27/18  CBC with Differential/Platelet  Result Value Ref Range   WBC 6.7 3.4 - 10.8 x10E3/uL   RBC 4.98 4.14 - 5.80 x10E6/uL   Hemoglobin 15.6 13.0 - 17.7 g/dL   Hematocrit 45.2 37.5 - 51.0 %   MCV 91 79 - 97 fL   MCH 31.3 26.6 - 33.0 pg   MCHC 34.5 31.5 - 35.7 g/dL   RDW 13.1 11.6 - 15.4 %   Platelets 253 150 - 450 x10E3/uL   Neutrophils 57 Not Estab. %   Lymphs 29 Not Estab. %   Monocytes 9 Not Estab. %   Eos 4 Not Estab. %   Basos 1 Not Estab. %   Neutrophils Absolute 3.8 1.4 - 7.0 x10E3/uL   Lymphocytes Absolute 1.9 0.7 - 3.1 x10E3/uL   Monocytes Absolute 0.6 0.1 - 0.9 x10E3/uL   EOS (ABSOLUTE) 0.3 0.0 - 0.4 x10E3/uL   Basophils Absolute 0.1 0.0 - 0.2 x10E3/uL   Immature Granulocytes 0 Not Estab. %   Immature Grans (Abs) 0.0 0.0 - 0.1 x10E3/uL  CMP14+EGFR  Result Value Ref Range   Glucose 141 (H) 65 - 99 mg/dL   BUN 12 6 - 24 mg/dL   Creatinine, Ser 0.84 0.76 - 1.27 mg/dL   GFR calc non Af Amer 100 >59 mL/min/1.73   GFR calc Af Amer 116 >59 mL/min/1.73   BUN/Creatinine Ratio 14 9 - 20   Sodium 143 134 - 144 mmol/L   Potassium 4.3 3.5 - 5.2 mmol/L   Chloride 101 96 - 106 mmol/L   CO2 20 20 - 29 mmol/L   Calcium 9.6 8.7 - 10.2 mg/dL   Total Protein 7.0 6.0 - 8.5 g/dL   Albumin 4.7 3.8 - 4.9 g/dL   Globulin, Total 2.3 1.5 - 4.5 g/dL   Albumin/Globulin Ratio 2.0 1.2 - 2.2   Bilirubin Total 0.5 0.0 - 1.2 mg/dL   Alkaline Phosphatase 108 39 - 117 IU/L   AST 29 0 - 40 IU/L   ALT 36 0 - 44 IU/L  Lipid panel  Result Value Ref Range   Cholesterol, Total 187 100 - 199 mg/dL   Triglycerides 252 (H) 0 - 149 mg/dL   HDL  44 >39 mg/dL   VLDL Cholesterol Cal 43 (H) 5 - 40 mg/dL   LDL Chol Calc (NIH) 100 (H) 0 - 99 mg/dL   Chol/HDL Ratio 4.3 0.0 - 5.0 ratio  Bayer DCA Hb A1c Waived  Result Value Ref Range   HB A1C (BAYER DCA - WAIVED) 6.8 <7.0 %       Pertinent labs & imaging results that were available during my care of the patient were reviewed by me and considered in my medical decision making.  Assessment & Plan:  Amour was seen today for medical management of chronic issues.  Diagnoses and all orders for this visit:  Type 2 diabetes mellitus without complication, with no history of insulin use (HCC) A1C 7.7 today. Diet and exercise encouraged. No adjustments today. Pt will make lifestyle changes and then reevaluate in 3 months. Will adjust medications at this time if warranted. Labs pending.  -     Bayer DCA Hb A1c Waived -     Microalbumin / creatinine urine ratio -     CMP14+EGFR -     CBC with Differential/Platelet -     Lipid panel  Hyperlipidemia associated with type 2 diabetes mellitus ([place]) Diet encouraged - increase intake of fresh fruits and vegetables, increase intake of lean proteins. Bake, broil, or grill foods. Avoid fried, greasy, and fatty foods. Avoid fast foods. Increase intake of fiber-rich whole grains. Exercise encouraged - at least 150 minutes per week and advance as tolerated.  Goal BMI < 25. Continue medications as prescribed. Follow up in 3-6 months as discussed.  -     Lipid panel  Hypertension associated with type 2 diabetes mellitus (HCC) BP fairly controlled. Changes were not made in regimen today. Goal BP is 130/80. Pt aware to report any persistent high or low readings. DASH diet and exercise encouraged. Exercise at least 150 minutes per week and increase as tolerated. Goal BMI > 25. Stress management encouraged. Avoid nicotine and tobacco product use. Avoid excessive alcohol and NSAID's. Avoid more than 2000 mg of sodium daily. Medications as prescribed. Follow up as  scheduled.  -     CMP14+EGFR -     CBC with Differential/Platelet  Microalbuminuria due to type 2 diabetes mellitus (HCC) No changes in urine output. Will recheck today.  -     Microalbumin / creatinine urine ratio     Continue all other maintenance medications.  Follow up plan: Return in about 3 months (around 06/30/2019), or if symptoms worsen or fail to improve, for DM.  Continue healthy lifestyle choices, including diet (rich in fruits, vegetables, and lean proteins, and low in salt and simple carbohydrates) and exercise (at least 30 minutes of moderate physical activity daily).  Educational handout given for DM  The above assessment and management plan was discussed with the patient. The patient verbalized understanding of and has agreed to the management plan. Patient is aware to call the clinic if they develop any new symptoms or if symptoms persist or worsen. Patient is aware when to return to the clinic for a follow-up visit. Patient educated on when it is appropriate to go to the emergency department.   Monia Pouch, FNP-C [facility] [phone]

## 2019-03-30 NOTE — Patient Instructions (Signed)

## 2019-03-31 LAB — CBC WITH DIFFERENTIAL/PLATELET
Basophils Absolute: 0.1 10*3/uL (ref 0.0–0.2)
Basos: 1 %
EOS (ABSOLUTE): 0.3 10*3/uL (ref 0.0–0.4)
Eos: 4 %
Hematocrit: 43.5 % (ref 37.5–51.0)
Hemoglobin: 16 g/dL (ref 13.0–17.7)
Immature Grans (Abs): 0 10*3/uL (ref 0.0–0.1)
Immature Granulocytes: 1 %
Lymphocytes Absolute: 2.6 10*3/uL (ref 0.7–3.1)
Lymphs: 31 %
MCH: 33.3 pg — ABNORMAL HIGH (ref 26.6–33.0)
MCHC: 36.8 g/dL — ABNORMAL HIGH (ref 31.5–35.7)
MCV: 91 fL (ref 79–97)
Monocytes Absolute: 0.7 10*3/uL (ref 0.1–0.9)
Monocytes: 8 %
Neutrophils Absolute: 4.8 10*3/uL (ref 1.4–7.0)
Neutrophils: 55 %
Platelets: 268 10*3/uL (ref 150–450)
RBC: 4.8 x10E6/uL (ref 4.14–5.80)
RDW: 12.7 % (ref 11.6–15.4)
WBC: 8.5 10*3/uL (ref 3.4–10.8)

## 2019-03-31 LAB — CMP14+EGFR
ALT: 30 IU/L (ref 0–44)
AST: 34 IU/L (ref 0–40)
Albumin/Globulin Ratio: 1.7 (ref 1.2–2.2)
Albumin: 4.7 g/dL (ref 3.8–4.9)
Alkaline Phosphatase: 112 IU/L (ref 39–117)
BUN/Creatinine Ratio: 11 (ref 9–20)
BUN: 10 mg/dL (ref 6–24)
Bilirubin Total: 0.3 mg/dL (ref 0.0–1.2)
CO2: 19 mmol/L — ABNORMAL LOW (ref 20–29)
Calcium: 9.8 mg/dL (ref 8.7–10.2)
Chloride: 102 mmol/L (ref 96–106)
Creatinine, Ser: 0.9 mg/dL (ref 0.76–1.27)
GFR calc Af Amer: 112 mL/min/{1.73_m2} (ref >59)
GFR calc non Af Amer: 96 mL/min/{1.73_m2} (ref >59)
Globulin, Total: 2.8 g/dL (ref 1.5–4.5)
Glucose: 153 mg/dL — ABNORMAL HIGH (ref 65–99)
Potassium: 3.7 mmol/L (ref 3.5–5.2)
Sodium: 141 mmol/L (ref 134–144)
Total Protein: 7.5 g/dL (ref 6.0–8.5)

## 2019-03-31 LAB — LIPID PANEL
Chol/HDL Ratio: 5.7 ratio — ABNORMAL HIGH (ref 0.0–5.0)
Cholesterol, Total: 195 mg/dL (ref 100–199)
HDL: 34 mg/dL — ABNORMAL LOW (ref >39)
LDL Chol Calc (NIH): 79 mg/dL (ref 0–99)
Triglycerides: 515 mg/dL — ABNORMAL HIGH (ref 0–149)
VLDL Cholesterol Cal: 82 mg/dL — ABNORMAL HIGH (ref 5–40)

## 2019-03-31 LAB — MICROALBUMIN / CREATININE URINE RATIO
Creatinine, Urine: 74 mg/dL
Microalb/Creat Ratio: 293 mg/g creat — ABNORMAL HIGH (ref 0–29)
Microalbumin, Urine: 217.1 ug/mL

## 2019-04-12 ENCOUNTER — Other Ambulatory Visit: Payer: Self-pay | Admitting: Family Medicine

## 2019-04-12 DIAGNOSIS — E1129 Type 2 diabetes mellitus with other diabetic kidney complication: Secondary | ICD-10-CM

## 2019-05-05 ENCOUNTER — Encounter: Payer: Self-pay | Admitting: *Deleted

## 2019-06-06 LAB — HM DIABETES EYE EXAM

## 2019-07-01 ENCOUNTER — Ambulatory Visit: Payer: Medicare Other | Admitting: Family Medicine

## 2019-07-01 ENCOUNTER — Ambulatory Visit: Payer: Medicare Other | Admitting: Family

## 2019-07-04 ENCOUNTER — Encounter: Payer: Self-pay | Admitting: Family

## 2019-07-04 ENCOUNTER — Ambulatory Visit (INDEPENDENT_AMBULATORY_CARE_PROVIDER_SITE_OTHER): Payer: Medicare Other | Admitting: Family

## 2019-07-04 ENCOUNTER — Other Ambulatory Visit: Payer: Self-pay

## 2019-07-04 VITALS — BP 149/91 | HR 87 | Temp 97.2°F | Ht 74.0 in | Wt 190.8 lb

## 2019-07-04 DIAGNOSIS — K219 Gastro-esophageal reflux disease without esophagitis: Secondary | ICD-10-CM

## 2019-07-04 DIAGNOSIS — F3341 Major depressive disorder, recurrent, in partial remission: Secondary | ICD-10-CM

## 2019-07-04 DIAGNOSIS — E1159 Type 2 diabetes mellitus with other circulatory complications: Secondary | ICD-10-CM | POA: Diagnosis not present

## 2019-07-04 DIAGNOSIS — R809 Proteinuria, unspecified: Secondary | ICD-10-CM

## 2019-07-04 DIAGNOSIS — F1020 Alcohol dependence, uncomplicated: Secondary | ICD-10-CM

## 2019-07-04 DIAGNOSIS — E785 Hyperlipidemia, unspecified: Secondary | ICD-10-CM

## 2019-07-04 DIAGNOSIS — M1A0720 Idiopathic chronic gout, left ankle and foot, without tophus (tophi): Secondary | ICD-10-CM

## 2019-07-04 DIAGNOSIS — E1129 Type 2 diabetes mellitus with other diabetic kidney complication: Secondary | ICD-10-CM

## 2019-07-04 DIAGNOSIS — I10 Essential (primary) hypertension: Secondary | ICD-10-CM

## 2019-07-04 DIAGNOSIS — E119 Type 2 diabetes mellitus without complications: Secondary | ICD-10-CM | POA: Diagnosis not present

## 2019-07-04 DIAGNOSIS — E1169 Type 2 diabetes mellitus with other specified complication: Secondary | ICD-10-CM | POA: Diagnosis not present

## 2019-07-04 DIAGNOSIS — F251 Schizoaffective disorder, depressive type: Secondary | ICD-10-CM

## 2019-07-04 DIAGNOSIS — F333 Major depressive disorder, recurrent, severe with psychotic symptoms: Secondary | ICD-10-CM

## 2019-07-04 DIAGNOSIS — F17210 Nicotine dependence, cigarettes, uncomplicated: Secondary | ICD-10-CM

## 2019-07-04 DIAGNOSIS — F4310 Post-traumatic stress disorder, unspecified: Secondary | ICD-10-CM

## 2019-07-04 LAB — BAYER DCA HB A1C WAIVED: HB A1C (BAYER DCA - WAIVED): 7.1 % — ABNORMAL HIGH (ref <7.0)

## 2019-07-04 MED ORDER — ATORVASTATIN CALCIUM 20 MG PO TABS: 20.0000 mg | ORAL_TABLET | Freq: Every day | ORAL | 3 refills | Status: DC

## 2019-07-04 MED ORDER — PANTOPRAZOLE SODIUM 40 MG PO TBEC: 40.0000 mg | DELAYED_RELEASE_TABLET | Freq: Every day | ORAL | 1 refills | Status: DC

## 2019-07-04 MED ORDER — HYDROCHLOROTHIAZIDE 25 MG PO TABS: 25.0000 mg | ORAL_TABLET | Freq: Every day | ORAL | 1 refills | Status: DC

## 2019-07-04 MED ORDER — ENALAPRIL MALEATE 2.5 MG PO TABS: 2.5000 mg | ORAL_TABLET | Freq: Every day | ORAL | 1 refills | Status: DC

## 2019-07-04 MED ORDER — ALLOPURINOL 300 MG PO TABS: 300.0000 mg | ORAL_TABLET | Freq: Every day | ORAL | 1 refills | Status: DC

## 2019-07-04 MED ORDER — TRAZODONE HCL 100 MG PO TABS: 100.0000 mg | ORAL_TABLET | Freq: Every day | ORAL | 1 refills | Status: DC

## 2019-07-04 MED ORDER — VENLAFAXINE HCL ER 75 MG PO CP24: 225.0000 mg | ORAL_CAPSULE | Freq: Every day | ORAL | 1 refills | Status: DC

## 2019-07-04 MED ORDER — METFORMIN HCL 500 MG PO TABS: 500.0000 mg | ORAL_TABLET | Freq: Every day | ORAL | 1 refills | Status: DC

## 2019-07-04 MED ORDER — AMLODIPINE BESYLATE 10 MG PO TABS: 10.0000 mg | ORAL_TABLET | Freq: Every day | ORAL | 1 refills | Status: DC

## 2019-07-04 NOTE — Patient Instructions (Signed)
Alcohol Abuse and Nutrition Alcohol abuse is any pattern of alcohol consumption that harms your health, relationships, or work. Alcohol abuse can cause poor nutrition (malnutrition or malnourishment) and a lack of nutrients (nutrient deficiencies), which can lead to more complications. Alcohol abuse brings malnutrition and nutrient deficiencies in two ways:  It causes your liver to work abnormally. This affects how your body divides (breaks down) and absorbs nutrients from food.  It causes you to eat poorly. Many people who abuse alcohol do not eat enough carbohydrates, protein, fat, vitamins, and minerals. Nutrients that are commonly lacking (deficient) in people who abuse alcohol include:  Vitamins. ? Vitamin A. This is needed for your vision, metabolism, and ability to fight off infections (immunity). ? B vitamins. These include folate, thiamine, and niacin. These are needed for new cell growth. ? Vitamin C. This plays an important role in wound healing, immunity, and helping your body to absorb iron. ? Vitamin D. This is necessary for your body to absorb and use calcium. It is produced by your liver, but you can also get it from food and from sun exposure.  Minerals. ? Calcium. This is needed for healthy bones as well as heart and blood vessel (cardiovascular) function. ? Iron. This is important for blood, muscle, and nervous system functioning. ? Magnesium. This plays an important role in muscle and nerve function, and it helps to control blood sugar and blood pressure. ? Zinc. This is important for the normal functioning of your nervous system and digestive system (gastrointestinal tract). If you think that you have an alcohol dependency problem, or if it is hard to stop drinking because you feel sick or different when you do not use alcohol, talk with your health care provider or another health professional about where to get help. Nutrition is an essential factor in therapy for alcohol  abuse. Your health care provider or diet and nutrition specialist (dietitian) will work with you to design a plan that can help to restore nutrients to your body and prevent the risk of complications. What is my plan? Your dietitian may develop a specific eating plan that is based on your condition and any other problems that you have. An eating plan will commonly include:  A balanced diet. ? Grains: 6-8 oz (170-227 g) a day. Examples of 1 oz of whole grains include 1 cup of whole-wheat cereal,  cup of brown rice, or 1 slice of whole-wheat bread. ? Vegetables: 2-3 cups a day. Examples of 1 cup of vegetables include 2 medium carrots, 1 large tomato, or 2 stalks of celery. ? Fruits: 1-2 cups a day. Examples of 1 cup of fruit include 1 large banana, 1 small apple, 8 large strawberries, or 1 large orange. ? Meat and other protein: 5-6 oz (142-170 g) a day.  A cut of meat or fish that is the size of a deck of cards is about 3-4 oz.  Foods that provide 1 oz of protein include 1 egg,  cup of nuts or seeds, or 1 tablespoon (16 g) of peanut butter. ? Dairy: 2-3 cups a day. Examples of 1 cup of dairy include 8 oz (230 mL) of milk, 8 oz (230 g) of yogurt, or 1 oz (44 g) of natural cheese.  Vitamin and mineral supplements. What are tips for following this plan?  Eat frequent meals and snacks. Try to eat 5-6 small meals each day.  Take vitamin or mineral supplements as recommended by your dietitian.  If you are malnourished  or if your dietitian recommends it: ? You may follow a high-protein, high-calorie diet. This may include:  2,000-3,000 calories (kilocalories) a day.  70-100 g (grams) of protein a day. ? You may be directed to follow a diet that includes a complete nutritional supplement beverage. This can help to restore calories, protein, and vitamins to your body. Depending on your condition, you may be advised to consume this beverage instead of your meals or in addition to them.  Certain  medicines may cause changes in your appetite, taste, and weight. Work with your health care provider and dietitian to make any changes to your medicines and eating plan.  If you are unable to take in enough food and calories by mouth, your health care provider may recommend a feeding tube. This tube delivers nutritional supplements directly to your stomach. Recommended foods  Eat foods that are high in molecules that prevent oxygen from reacting with your food (antioxidants). These foods include grapes, berries, nuts, green tea, and dark green or orange vegetables. Eating these can help to prevent some of the stress that is placed on your liver by consuming alcohol.  Eat a variety of fresh fruits and vegetables each day. This will help you to get fiber and vitamins in your diet.  Drink plenty of water and other clear fluids, such as apple juice and broth. Try to drink at least 48-64 oz (1.5-2 L) of water a day.  Include foods fortified with vitamins and minerals in your diet. Commonly fortified foods include milk, orange juice, cereal, and bread.  Eat a variety of foods that are high in omega-3 and omega-6 fatty acids. These include fish, nuts and seeds, and soybeans. These foods may help your liver to recover and may also stabilize your mood.  If you are a vegetarian: ? Eat a variety of protein-rich foods. ? Pair whole grains with plant-based proteins at meals and snack time. For example, eat rice with beans, put peanut butter on whole-grain toast, or eat oatmeal with sunflower seeds. The items listed above may not be a complete list of foods and beverages you can eat. Contact a dietitian for more information. Foods to avoid  Avoid foods and drinks that are high in fat and sugar. Sugary drinks, salty snacks, and candy contain empty calories. This means that they lack important nutrients such as protein, fiber, and vitamins.  Avoid alcohol. This is the best way to avoid malnutrition due to  alcohol abuse. If you must drink, drink measured amounts. Measured drinking means limiting your intake to no more than 1 drink a day for nonpregnant women and 2 drinks a day for men. One drink equals 12 oz (355 mL) of beer, 5 oz (148 mL) of wine, or 1 oz (44 mL) of hard liquor.  Limit your intake of caffeine. Replace drinks like coffee and black tea with decaffeinated coffee and decaffeinated herbal tea. The items listed above may not be a complete list of foods and beverages you should avoid. Contact a dietitian for more information. Summary  Alcohol abuse can cause poor nutrition (malnutrition or malnourishment) and a lack of nutrients (nutrient deficiencies), which can lead to more health problems.  Common nutrient deficiencies include vitamin deficiencies (A, B, C, and D) and mineral deficiencies (calcium, iron, magnesium, and zinc).  Nutrition is an essential factor in therapy for alcohol abuse.  Your health care provider and dietitian can help you to develop a specific eating plan that includes a balanced diet plus vitamin and  mineral supplements. This information is not intended to replace advice given to you by your health care provider. Make sure you discuss any questions you have with your health care provider. Document Revised: 05/04/2018 Document Reviewed: 09/30/2016 Elsevier Patient Education  [place].

## 2019-07-04 NOTE — Progress Notes (Signed)
Subjective:    Patient ID: Frank Moses, male    DOB: [birth year], 54 y.o.   MRN: [id number]  Chief Complaint  Patient presents with  . Establish Care    rakes pt  . Medical Management of Chronic Issues    check up of chronic medical conditions   PT presents to the office today for chronic follow up. He is followed by [facility] every 3 month for PTSD, depression, and Schizoaffective.   Reports drinking 12-18 beers a day.  Diabetes He presents for his follow-up diabetic visit. He has type 2 diabetes mellitus. His disease course has been stable. There are no hypoglycemic associated symptoms. Pertinent negatives for diabetes include no blurred vision and no foot paresthesias. There are no hypoglycemic complications. Symptoms are stable. Pertinent negatives for diabetic complications include no CVA, heart disease, nephropathy or peripheral neuropathy. Risk factors for coronary artery disease include dyslipidemia, diabetes mellitus, male sex, hypertension and sedentary lifestyle. He is following a generally unhealthy diet. (Does not check BS regularly at home ) An ACE inhibitor/angiotensin II receptor blocker is being taken. Eye exam is current.  Hypertension This is a chronic problem. The current episode started more than 1 year ago. The problem has been waxing and waning since onset. The problem is uncontrolled. Pertinent negatives include no blurred vision, malaise/fatigue, peripheral edema or shortness of breath. Risk factors for coronary artery disease include dyslipidemia, diabetes mellitus, male gender, sedentary lifestyle and smoking/tobacco exposure. The current treatment provides mild improvement. There is no history of kidney disease, CAD/MI or CVA.  Hyperlipidemia This is a chronic problem. The current episode started more than 1 year ago. The problem is controlled. Recent lipid tests were reviewed and are normal. Pertinent negatives include no shortness of breath. He is  currently on no antihyperlipidemic treatment. The current treatment provides no improvement of lipids. Risk factors for coronary artery disease include dyslipidemia, male sex, hypertension and a sedentary lifestyle.  Gastroesophageal Reflux He complains of belching and heartburn. This is a chronic problem. The current episode started more than 1 year ago. The problem occurs occasionally. Risk factors include smoking/tobacco exposure and ETOH use. He has tried a PPI for the symptoms. The treatment provided moderate relief.  Insomnia Primary symptoms: difficulty falling asleep, frequent awakening, no malaise/fatigue.  The current episode started more than one year. The onset quality is gradual. The problem occurs intermittently.  Nicotine Dependence Presents for follow-up visit. Symptoms include insomnia. His urge triggers include company of smokers. He smokes < 1/2 a pack of cigarettes per day.  Gout Pt takes allopurinol daily. States his last flare up was several months ago.  He does drink 12-18 beers a day.     Review of Systems  Constitutional: Negative for malaise/fatigue.  Eyes: Negative for blurred vision.  Respiratory: Negative for shortness of breath.   Gastrointestinal: Positive for heartburn.  Psychiatric/Behavioral: The patient has insomnia.   All other systems reviewed and are negative.      Objective:   Physical Exam Vitals reviewed.  Constitutional:      General: He is not in acute distress.    Appearance: He is well-developed.  HENT:     Head: Normocephalic.     Right Ear: Tympanic membrane normal.     Left Ear: Tympanic membrane normal.  Eyes:     General:        Right eye: No discharge.        Left eye: No discharge.     Pupils:  Pupils are equal, round, and reactive to light.  Neck:     Thyroid: No thyromegaly.  Cardiovascular:     Rate and Rhythm: Normal rate and regular rhythm.     Heart sounds: Normal heart sounds. No murmur.  Pulmonary:     Effort:  Pulmonary effort is normal. No respiratory distress.     Breath sounds: Normal breath sounds. No wheezing.  Abdominal:     General: Bowel sounds are normal. There is no distension.     Palpations: Abdomen is soft.     Tenderness: There is no abdominal tenderness.  Musculoskeletal:        General: No tenderness. Normal range of motion.     Cervical back: Normal range of motion and neck supple.  Skin:    General: Skin is warm and dry.     Findings: No erythema or rash.  Neurological:     Mental Status: He is alert and oriented to person, place, and time.     Cranial Nerves: No cranial nerve deficit.     Deep Tendon Reflexes: Reflexes are normal and symmetric.  Psychiatric:        Behavior: Behavior normal.        Thought Content: Thought content normal.        Judgment: Judgment normal.       BP (!) 155/96   Pulse 85   Temp (!) 97.2 F (36.2 C) (Temporal)   Ht _0  (1.88 m)   Wt 190 lb 12.8 oz (86.5 kg)   SpO2 94%   BMI 24.50 kg/m      Assessment & Plan:  Frank Moses comes in today with chief complaint of Establish Care (rakes pt) and Medical Management of Chronic Issues (check up of chronic medical conditions)   Diagnosis and orders addressed:  1. Type 2 diabetes mellitus without complication, with no history of insulin use (HCC) - Bayer DCA Hb A1c Waived - metFORMIN (GLUCOPHAGE) 500 MG tablet; Take 1 tablet (500 mg total) by mouth daily with breakfast.  Dispense: 90 tablet; Refill: 1 - CMP14+EGFR  2. 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  Dispense: 90 tablet; Refill: 1 - CMP14+EGFR  3. Gastroesophageal reflux disease without esophagitis - pantoprazole (PROTONIX) 40 MG tablet; Take 1 tablet (40 mg total) by mouth daily. For acid reflux  Dispense: 90 tablet; Refill: 1 - CMP14+EGFR  4. Hypertension associated with type 2 diabetes mellitus (HCC) -  amLODipine (NORVASC) 10 MG tablet; Take 1 tablet (10 mg total) by mouth daily.  Dispense: 90 tablet; Refill: 1 - enalapril (VASOTEC) 2.5 MG tablet; Take 1 tablet (2.5 mg total) by mouth daily.  Dispense: 90 tablet; Refill: 1 - hydrochlorothiazide (HYDRODIURIL) 25 MG tablet; Take 1 tablet (25 mg total) by mouth daily. For high blood pressure  Dispense: 90 tablet; Refill: 1 - CMP14+EGFR  5. Hyperlipidemia associated with type 2 diabetes mellitus (HCC) - CMP14+EGFR - atorvastatin (LIPITOR) 20 MG tablet; Take 1 tablet (20 mg total) by mouth daily.  Dispense: 90 tablet; Refill: 3  6. Microalbuminuria due to type 2 diabetes mellitus (HCC) - CMP14+EGFR  7. Alcohol use disorder, severe, dependence ([place]) - CMP14+EGFR  8. MDD (major depressive disorder), recurrent, severe, with psychosis ([place]) - CMP14+EGFR  9. Cigarette nicotine dependence without complication - ZOX09+UEAV  10. PTSD (post-traumatic stress disorder) - CMP14+EGFR  11. Recurrent major depressive disorder, in partial remission (  [place]) - CMP14+EGFR  12. Schizoaffective disorder, depressive type ([place])  - CMP14+EGFR   Labs pending Health Maintenance reviewed Diet and exercise encouraged  Follow up plan: 6 months    Evelina Dun, FNP

## 2019-07-05 LAB — CMP14+EGFR
ALT: 23 IU/L (ref 0–44)
AST: 29 IU/L (ref 0–40)
Albumin/Globulin Ratio: 1.7 (ref 1.2–2.2)
Albumin: 4.7 g/dL (ref 3.8–4.9)
Alkaline Phosphatase: 109 IU/L (ref 48–121)
BUN/Creatinine Ratio: 12 (ref 9–20)
BUN: 11 mg/dL (ref 6–24)
Bilirubin Total: 0.6 mg/dL (ref 0.0–1.2)
CO2: 21 mmol/L (ref 20–29)
Calcium: 9.7 mg/dL (ref 8.7–10.2)
Chloride: 101 mmol/L (ref 96–106)
Creatinine, Ser: 0.9 mg/dL (ref 0.76–1.27)
GFR calc Af Amer: 112 mL/min/{1.73_m2} (ref >59)
GFR calc non Af Amer: 96 mL/min/{1.73_m2} (ref >59)
Globulin, Total: 2.7 g/dL (ref 1.5–4.5)
Glucose: 144 mg/dL — ABNORMAL HIGH (ref 65–99)
Potassium: 4.1 mmol/L (ref 3.5–5.2)
Sodium: 141 mmol/L (ref 134–144)
Total Protein: 7.4 g/dL (ref 6.0–8.5)

## 2019-08-29 ENCOUNTER — Other Ambulatory Visit: Payer: Self-pay | Admitting: Family

## 2019-10-18 IMAGING — US US ABDOMEN LIMITED
1 series · 14 of 25 positions shown · non-contrast
Comparison: CT abdomen and pelvis 01/31/2014

CLINICAL DATA: Elevated alkaline phosphatase and serum GGT levels,
history hypertension, hyperlipidemia, new onset
non-insulin-dependent diabetes mellitus

EXAM:
ULTRASOUND ABDOMEN LIMITED RIGHT UPPER QUADRANT

[Series 1: us abdomen limited · 0.19mm/px · 14 of 68 slices shown]
[im 1/68]
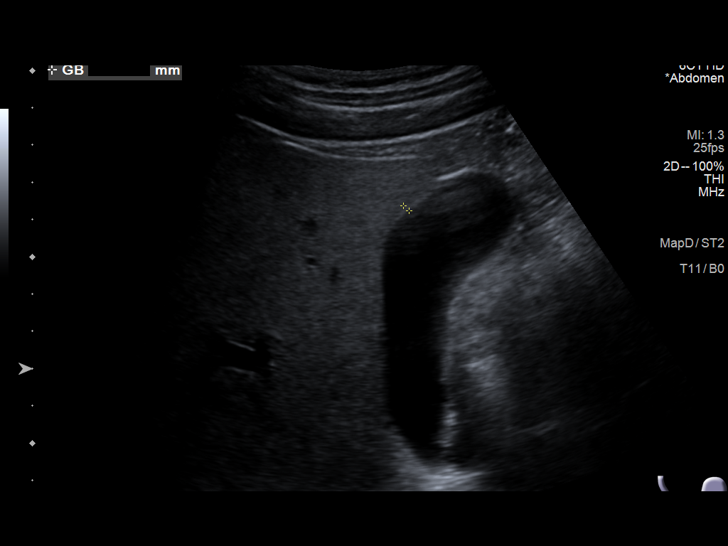
[im 6/68]
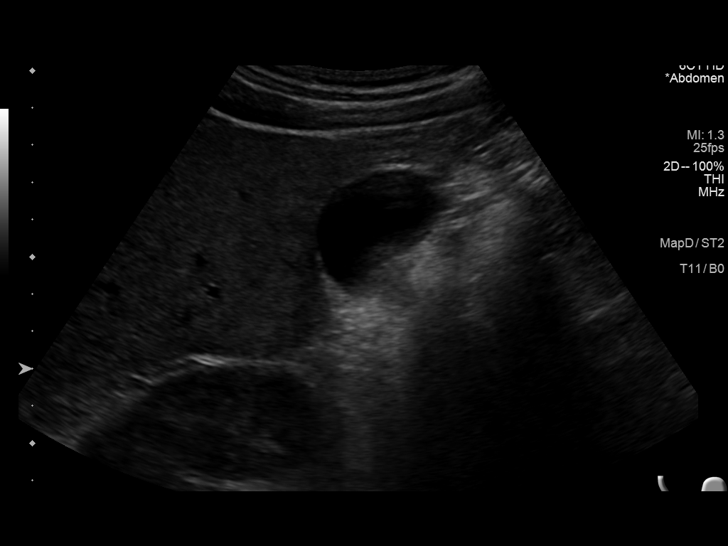
[im 12/68]
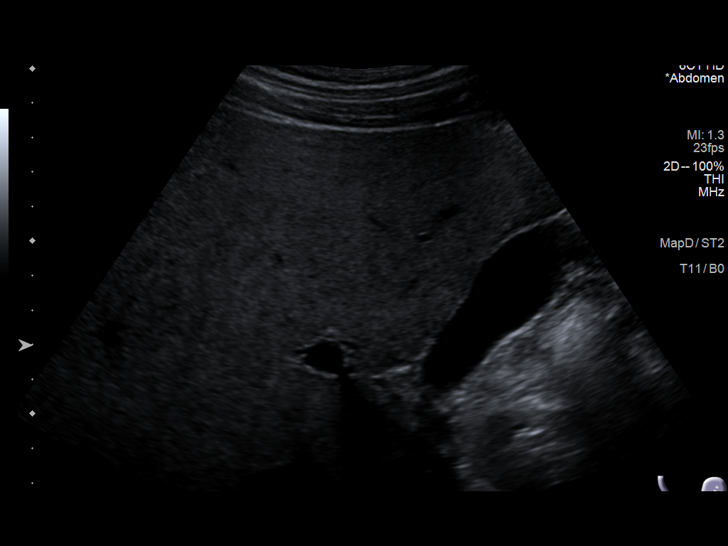
[im 17/68]
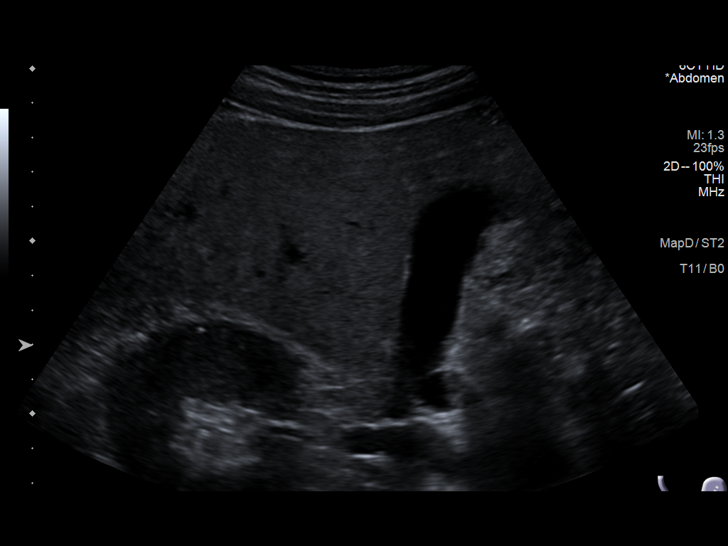
[im 23/68]
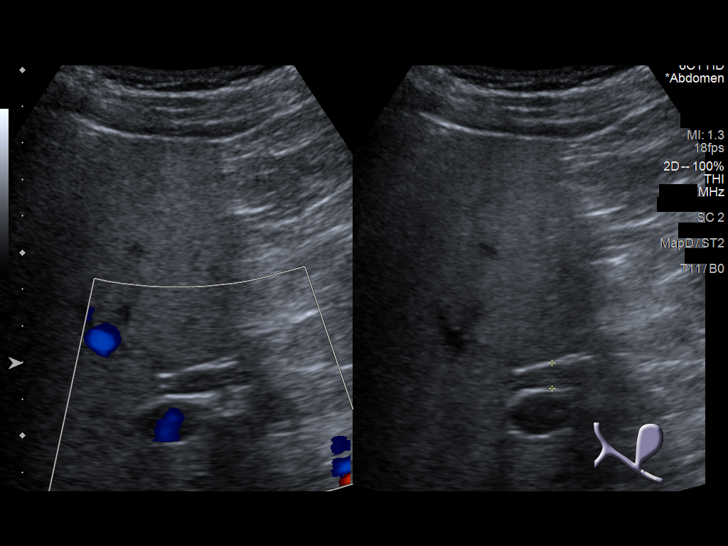
[im 26/68]
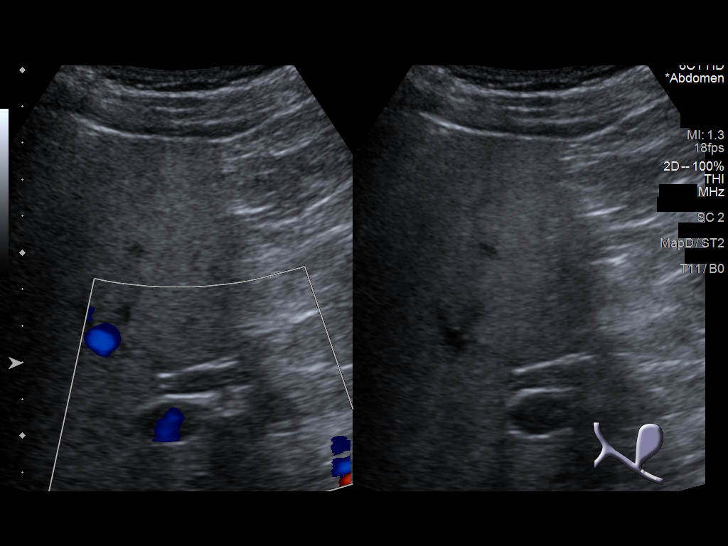
[im 31/68]
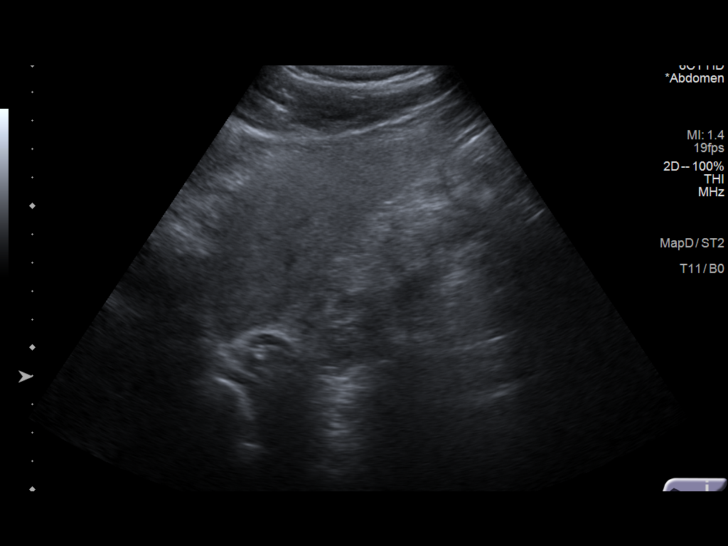
[im 37/68]
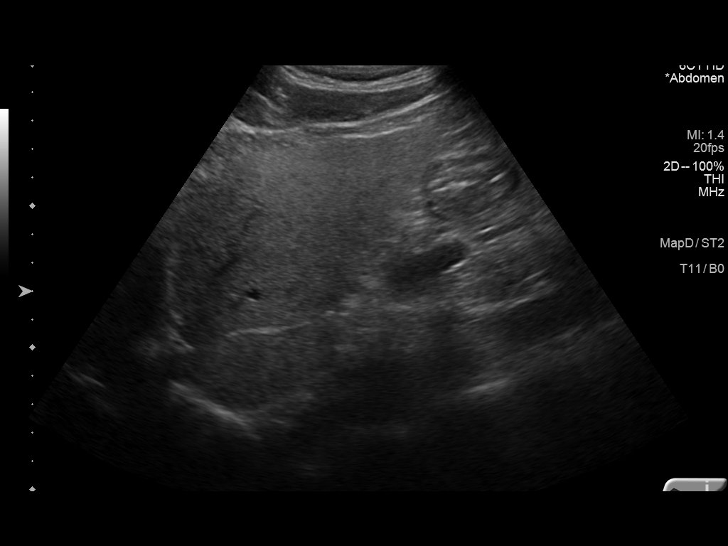
[im 42/68]
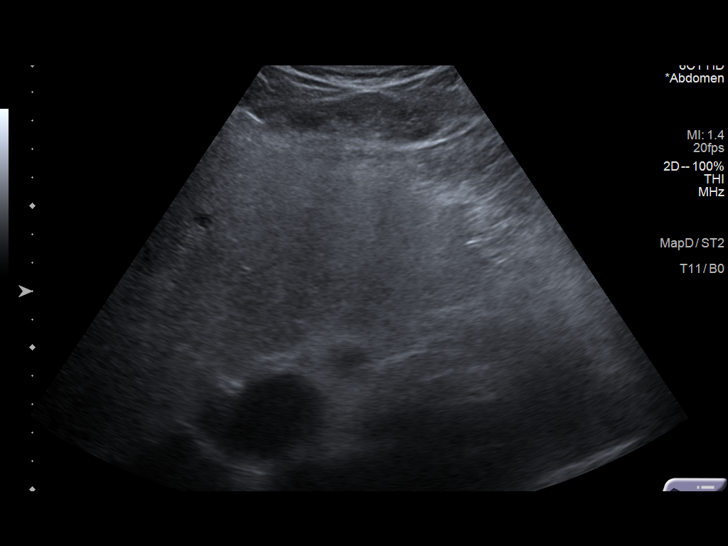
[im 45/68]
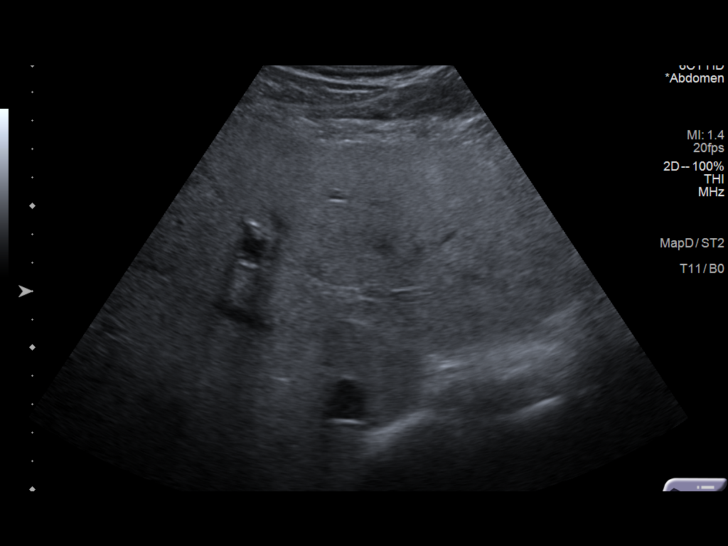
[im 51/68]
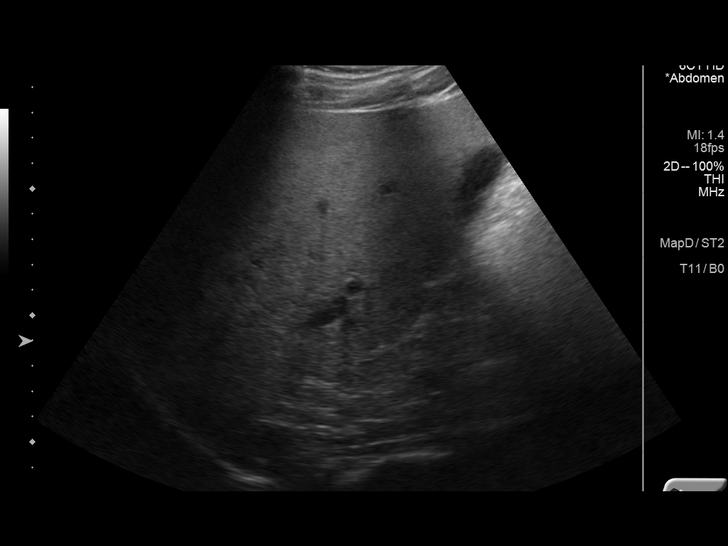
[im 56/68]
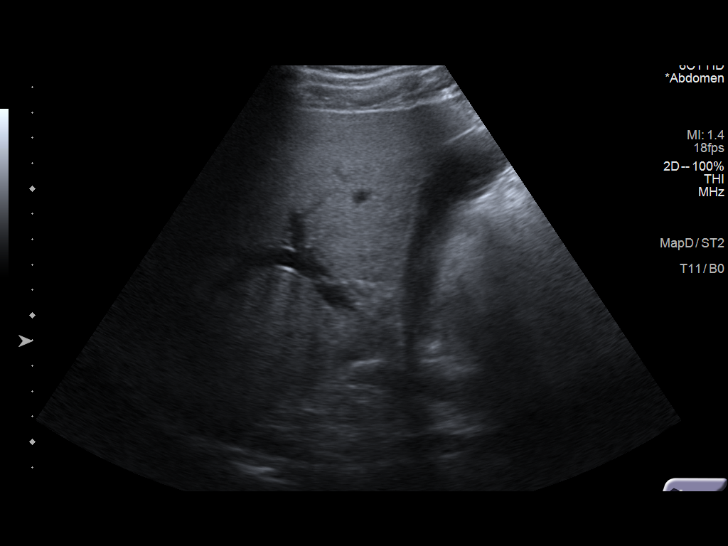
[im 62/68]
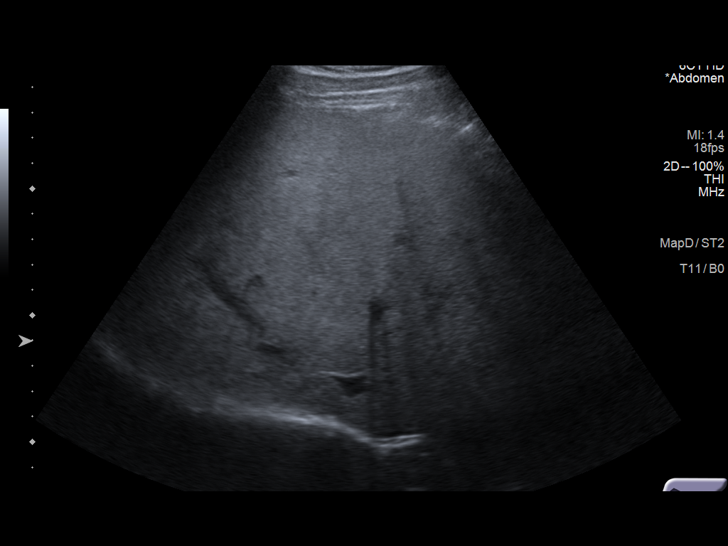
[im 68/68]
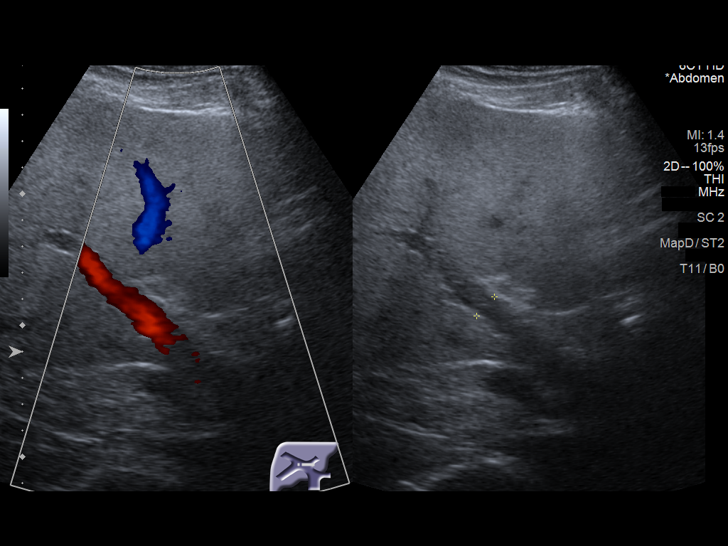

[14 of 25 positions shown; findings below may reference images not displayed]

FINDINGS: Gallbladder:

Normally distended without stones or wall thickening. No
pericholecystic fluid or sonographic Murphy sign.

Common bile duct:

Diameter: 7 mm diameter, minimally prominent

Liver:

Echogenic parenchyma, likely fatty infiltration though this can be
seen with cirrhosis and certain infiltrative disorders. No focal
hepatic mass or nodularity identified. No intrahepatic biliary
dilatation. Portal vein is patent on color Doppler imaging with
normal direction of blood flow towards the liver.

Other: No RIGHT upper quadrant free fluid.
IMPRESSION: Probable fatty infiltration of liver as above.

Mildly prominent CBD though no accompanying intrahepatic biliary
dilatation.

## 2019-11-03 ENCOUNTER — Ambulatory Visit: Payer: Medicare Other | Admitting: Family

## 2019-11-04 ENCOUNTER — Encounter: Payer: Self-pay | Admitting: Family

## 2019-11-07 ENCOUNTER — Other Ambulatory Visit: Payer: Self-pay | Admitting: Family

## 2019-11-07 DIAGNOSIS — I152 Hypertension secondary to endocrine disorders: Secondary | ICD-10-CM

## 2019-11-07 DIAGNOSIS — E1159 Type 2 diabetes mellitus with other circulatory complications: Secondary | ICD-10-CM

## 2019-11-29 ENCOUNTER — Other Ambulatory Visit: Payer: Self-pay

## 2019-11-29 ENCOUNTER — Ambulatory Visit (INDEPENDENT_AMBULATORY_CARE_PROVIDER_SITE_OTHER): Payer: Medicare Other | Admitting: Family

## 2019-11-29 ENCOUNTER — Encounter: Payer: Self-pay | Admitting: Family

## 2019-11-29 VITALS — BP 141/79 | HR 83 | Temp 98.2°F | Ht 74.0 in | Wt 196.2 lb

## 2019-11-29 DIAGNOSIS — Z1159 Encounter for screening for other viral diseases: Secondary | ICD-10-CM | POA: Diagnosis not present

## 2019-11-29 DIAGNOSIS — F17210 Nicotine dependence, cigarettes, uncomplicated: Secondary | ICD-10-CM

## 2019-11-29 DIAGNOSIS — E119 Type 2 diabetes mellitus without complications: Secondary | ICD-10-CM

## 2019-11-29 DIAGNOSIS — F1020 Alcohol dependence, uncomplicated: Secondary | ICD-10-CM

## 2019-11-29 DIAGNOSIS — M1A0720 Idiopathic chronic gout, left ankle and foot, without tophus (tophi): Secondary | ICD-10-CM | POA: Diagnosis not present

## 2019-11-29 DIAGNOSIS — E1129 Type 2 diabetes mellitus with other diabetic kidney complication: Secondary | ICD-10-CM

## 2019-11-29 DIAGNOSIS — F333 Major depressive disorder, recurrent, severe with psychotic symptoms: Secondary | ICD-10-CM

## 2019-11-29 DIAGNOSIS — E785 Hyperlipidemia, unspecified: Secondary | ICD-10-CM

## 2019-11-29 DIAGNOSIS — E1169 Type 2 diabetes mellitus with other specified complication: Secondary | ICD-10-CM | POA: Diagnosis not present

## 2019-11-29 DIAGNOSIS — F251 Schizoaffective disorder, depressive type: Secondary | ICD-10-CM

## 2019-11-29 DIAGNOSIS — R809 Proteinuria, unspecified: Secondary | ICD-10-CM

## 2019-11-29 DIAGNOSIS — I152 Hypertension secondary to endocrine disorders: Secondary | ICD-10-CM

## 2019-11-29 DIAGNOSIS — F4310 Post-traumatic stress disorder, unspecified: Secondary | ICD-10-CM

## 2019-11-29 DIAGNOSIS — K219 Gastro-esophageal reflux disease without esophagitis: Secondary | ICD-10-CM

## 2019-11-29 DIAGNOSIS — D2230 Melanocytic nevi of unspecified part of face: Secondary | ICD-10-CM

## 2019-11-29 DIAGNOSIS — E1159 Type 2 diabetes mellitus with other circulatory complications: Secondary | ICD-10-CM

## 2019-11-29 DIAGNOSIS — F3341 Major depressive disorder, recurrent, in partial remission: Secondary | ICD-10-CM

## 2019-11-29 LAB — BAYER DCA HB A1C WAIVED: HB A1C (BAYER DCA - WAIVED): 6.9 % (ref <7.0)

## 2019-11-29 MED ORDER — AMLODIPINE BESYLATE 10 MG PO TABS: 10.0000 mg | ORAL_TABLET | Freq: Every day | ORAL | 0 refills | Status: DC

## 2019-11-29 MED ORDER — ALLOPURINOL 300 MG PO TABS: 300.0000 mg | ORAL_TABLET | Freq: Every day | ORAL | 1 refills | Status: DC

## 2019-11-29 MED ORDER — METFORMIN HCL 500 MG PO TABS: 500.0000 mg | ORAL_TABLET | Freq: Every day | ORAL | 1 refills | Status: DC

## 2019-11-29 MED ORDER — HYDROCHLOROTHIAZIDE 25 MG PO TABS: 25.0000 mg | ORAL_TABLET | Freq: Every day | ORAL | 1 refills | Status: DC

## 2019-11-29 MED ORDER — ENALAPRIL MALEATE 2.5 MG PO TABS: 2.5000 mg | ORAL_TABLET | Freq: Every day | ORAL | 0 refills | Status: DC

## 2019-11-29 MED ORDER — PANTOPRAZOLE SODIUM 40 MG PO TBEC: 40.0000 mg | DELAYED_RELEASE_TABLET | Freq: Every day | ORAL | 1 refills | Status: DC

## 2019-11-29 MED ORDER — VENLAFAXINE HCL ER 75 MG PO CP24: 225.0000 mg | ORAL_CAPSULE | Freq: Every day | ORAL | 0 refills | Status: DC

## 2019-11-29 MED ORDER — TRAZODONE HCL 100 MG PO TABS: 100.0000 mg | ORAL_TABLET | Freq: Every day | ORAL | 1 refills | Status: DC

## 2019-11-29 MED ORDER — ATORVASTATIN CALCIUM 20 MG PO TABS: 20.0000 mg | ORAL_TABLET | Freq: Every day | ORAL | 3 refills | Status: DC

## 2019-11-29 NOTE — Patient Instructions (Signed)

## 2019-11-29 NOTE — Progress Notes (Signed)
Subjective:    Patient ID: Frank Moses, male    DOB: 08-04-1965, 54 y.o.   MRN: [id number]  Chief Complaint  Patient presents with  . Diabetes   PT presents to the office today for chronic follow up. He is followed by [facility] every 3 month for PTSD, depression, and Schizoaffective.   Reports drinking 6-8 beers a day.   Pt reports a mole on his face that he noticed years ago. However, it has become pruritic.   Diabetes He presents for his follow-up diabetic visit. He has type 2 diabetes mellitus. His disease course has been stable. Pertinent negatives for diabetes include no blurred vision and no foot paresthesias. Symptoms are stable. Pertinent negatives for diabetic complications include no heart disease. Risk factors for coronary artery disease include dyslipidemia, diabetes mellitus, male sex, hypertension and sedentary lifestyle. He is following a generally unhealthy diet. (Does not remember ) An ACE inhibitor/angiotensin II receptor blocker is being taken. Eye exam is current.  Hyperlipidemia This is a chronic problem. The current episode started more than 1 year ago. The problem is uncontrolled. Current antihyperlipidemic treatment includes statins. The current treatment provides moderate improvement of lipids. Risk factors for coronary artery disease include dyslipidemia, diabetes mellitus, hypertension, male sex and a sedentary lifestyle.  Anxiety Presents for follow-up visit. Symptoms include decreased concentration, excessive worry, irritability, panic and restlessness. Symptoms occur most days. The severity of symptoms is moderate.    Depression        This is a chronic problem.  The current episode started more than 1 year ago.   The onset quality is gradual.   The problem occurs intermittently.  Associated symptoms include decreased concentration, restlessness and sad.  Associated symptoms include no helplessness and no hopelessness.  Past medical history includes  anxiety.   Gastroesophageal Reflux He complains of belching and heartburn. This is a chronic problem. The current episode started more than 1 year ago. The problem occurs occasionally. He has tried a PPI for the symptoms. The treatment provided moderate relief.  Nicotine Dependence Presents for follow-up visit. Symptoms include decreased concentration and irritability. His urge triggers include company of smokers. He smokes 1 pack of cigarettes per day. Compliance with prior treatments has been good.      Review of Systems  Constitutional: Positive for irritability.  Eyes: Negative for blurred vision.  Gastrointestinal: Positive for heartburn.  Psychiatric/Behavioral: Positive for decreased concentration and depression.  All other systems reviewed and are negative.      Objective:   Physical Exam Vitals reviewed.  Constitutional:      General: He is not in acute distress.    Appearance: He is well-developed.  HENT:     Head: Normocephalic.     Right Ear: Tympanic membrane normal.     Left Ear: Tympanic membrane normal.  Eyes:     General:        Right eye: No discharge.        Left eye: No discharge.     Pupils: Pupils are equal, round, and reactive to light.  Neck:     Thyroid: No thyromegaly.  Cardiovascular:     Rate and Rhythm: Normal rate and regular rhythm.     Heart sounds: Normal heart sounds. No murmur heard.   Pulmonary:     Effort: Pulmonary effort is normal. No respiratory distress.     Breath sounds: Normal breath sounds. No wheezing.  Abdominal:     General: Bowel sounds are normal.  There is no distension.     Palpations: Abdomen is soft.     Tenderness: There is no abdominal tenderness.  Musculoskeletal:        General: No tenderness. Normal range of motion.     Cervical back: Normal range of motion and neck supple.  Skin:    General: Skin is warm and dry.     Findings: No erythema or rash.          Comments: Nevus on left face, under eye    Neurological:     Mental Status: He is alert and oriented to person, place, and time.     Cranial Nerves: No cranial nerve deficit.     Deep Tendon Reflexes: Reflexes are normal and symmetric.  Psychiatric:        Behavior: Behavior normal.        Thought Content: Thought content normal.        Judgment: Judgment normal.      BP (!) 141/79   Pulse 83   Temp 98.2 F (36.8 C) (Temporal)   Ht _0  (1.88 m)   Wt 196 lb 3.2 oz (89 kg)   SpO2 94%   BMI 25.19 kg/m       Assessment & Plan:  Frank Moses comes in today with chief complaint of Diabetes   Diagnosis and orders addressed:  1. Type 2 diabetes mellitus without complication, with no history of insulin use (HCC) - Bayer DCA Hb A1c Waived - metFORMIN (GLUCOPHAGE) 500 MG tablet; Take 1 tablet (500 mg total) by mouth daily with breakfast.  Dispense: 90 tablet; Refill: 1 - CMP14+EGFR  2. 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  Dispense: 90 tablet; Refill: 1 - CMP14+EGFR  3. Gastroesophageal reflux disease without esophagitis - pantoprazole (PROTONIX) 40 MG tablet; Take 1 tablet (40 mg total) by mouth daily. For acid reflux  Dispense: 90 tablet; Refill: 1 - CMP14+EGFR  4. Hyperlipidemia associated with type 2 diabetes mellitus (HCC) - atorvastatin (LIPITOR) 20 MG tablet; Take 1 tablet (20 mg total) by mouth daily.  Dispense: 90 tablet; Refill: 3 - CMP14+EGFR  5. Hypertension associated with type 2 diabetes mellitus (HCC) - hydrochlorothiazide (HYDRODIURIL) 25 MG tablet; Take 1 tablet (25 mg total) by mouth daily. For high blood pressure  Dispense: 90 tablet; Refill: 1 - enalapril (VASOTEC) 2.5 MG tablet; Take 1 tablet (2.5 mg total) by mouth daily.  Dispense: 90 tablet; Refill: 0 - amLODipine (NORVASC) 10 MG tablet; Take 1 tablet (10 mg total) by mouth daily.  Dispense: 90 tablet; Refill: 0 -  CMP14+EGFR  6. Microalbuminuria due to type 2 diabetes mellitus ([place]) - CMP14+EGFR  7. Alcohol use disorder, severe, dependence ([place]) - CMP14+EGFR  8. MDD (major depressive disorder), recurrent, severe, with psychosis ([place]) - CMP14+EGFR  9. Cigarette nicotine dependence without complication - GYF74+BSWH  10. Schizoaffective disorder, depressive type ([place])  - CMP14+EGFR  11. Recurrent major depressive disorder, in partial remission (HCC) - CMP14+EGFR  12. PTSD (post-traumatic stress disorder) - CMP14+EGFR  13. Need for hepatitis C screening test - CMP14+EGFR - Hepatitis C antibody  14. Nevus of fac - Ambulatory referral to Dermatology   Labs pending Health Maintenance reviewed Diet and exercise encouraged  Follow up plan: 4 months    Evelina Dun, FNP

## 2019-11-30 LAB — CMP14+EGFR
ALT: 50 IU/L — ABNORMAL HIGH (ref 0–44)
AST: 39 IU/L (ref 0–40)
Albumin/Globulin Ratio: 1.6 (ref 1.2–2.2)
Albumin: 4.2 g/dL (ref 3.8–4.9)
Alkaline Phosphatase: 102 IU/L (ref 44–121)
BUN/Creatinine Ratio: 13 (ref 9–20)
BUN: 11 mg/dL (ref 6–24)
Bilirubin Total: 0.3 mg/dL (ref 0.0–1.2)
CO2: 23 mmol/L (ref 20–29)
Calcium: 9 mg/dL (ref 8.7–10.2)
Chloride: 105 mmol/L (ref 96–106)
Creatinine, Ser: 0.87 mg/dL (ref 0.76–1.27)
GFR calc Af Amer: 113 mL/min/{1.73_m2} (ref >59)
GFR calc non Af Amer: 98 mL/min/{1.73_m2} (ref >59)
Globulin, Total: 2.7 g/dL (ref 1.5–4.5)
Glucose: 240 mg/dL — ABNORMAL HIGH (ref 65–99)
Potassium: 4 mmol/L (ref 3.5–5.2)
Sodium: 142 mmol/L (ref 134–144)
Total Protein: 6.9 g/dL (ref 6.0–8.5)

## 2019-11-30 LAB — HEPATITIS C ANTIBODY: Hep C Virus Ab: <0.1 s/co ratio (ref 0.0–0.9)

## 2020-02-08 ENCOUNTER — Other Ambulatory Visit: Payer: Medicare Other

## 2020-02-08 DIAGNOSIS — Z20822 Contact with and (suspected) exposure to covid-19: Secondary | ICD-10-CM | POA: Diagnosis not present

## 2020-02-09 LAB — NOVEL CORONAVIRUS, NAA: SARS-CoV-2, NAA: NOT DETECTED

## 2020-02-09 LAB — SARS-COV-2, NAA 2 DAY TAT

## 2020-02-13 ENCOUNTER — Other Ambulatory Visit: Payer: Medicare Other

## 2020-03-28 ENCOUNTER — Other Ambulatory Visit: Payer: Self-pay | Admitting: Family

## 2020-03-28 DIAGNOSIS — I152 Hypertension secondary to endocrine disorders: Secondary | ICD-10-CM

## 2020-03-29 ENCOUNTER — Ambulatory Visit: Payer: Medicare Other | Admitting: Family

## 2020-04-04 ENCOUNTER — Encounter: Payer: Self-pay | Admitting: Family

## 2020-04-17 ENCOUNTER — Other Ambulatory Visit: Payer: Self-pay

## 2020-04-17 ENCOUNTER — Ambulatory Visit (INDEPENDENT_AMBULATORY_CARE_PROVIDER_SITE_OTHER): Payer: Medicare Other

## 2020-04-17 ENCOUNTER — Encounter: Payer: Self-pay | Admitting: Family

## 2020-04-17 ENCOUNTER — Ambulatory Visit (INDEPENDENT_AMBULATORY_CARE_PROVIDER_SITE_OTHER): Payer: Medicare Other | Admitting: Family

## 2020-04-17 VITALS — BP 148/89 | HR 89 | Temp 97.4°F | Ht 74.0 in | Wt 195.4 lb

## 2020-04-17 DIAGNOSIS — F3341 Major depressive disorder, recurrent, in partial remission: Secondary | ICD-10-CM

## 2020-04-17 DIAGNOSIS — Z9889 Other specified postprocedural states: Secondary | ICD-10-CM | POA: Diagnosis not present

## 2020-04-17 DIAGNOSIS — E1159 Type 2 diabetes mellitus with other circulatory complications: Secondary | ICD-10-CM

## 2020-04-17 DIAGNOSIS — M1A0720 Idiopathic chronic gout, left ankle and foot, without tophus (tophi): Secondary | ICD-10-CM | POA: Diagnosis not present

## 2020-04-17 DIAGNOSIS — M542 Cervicalgia: Secondary | ICD-10-CM | POA: Diagnosis not present

## 2020-04-17 DIAGNOSIS — K219 Gastro-esophageal reflux disease without esophagitis: Secondary | ICD-10-CM | POA: Diagnosis not present

## 2020-04-17 DIAGNOSIS — F333 Major depressive disorder, recurrent, severe with psychotic symptoms: Secondary | ICD-10-CM

## 2020-04-17 DIAGNOSIS — F1020 Alcohol dependence, uncomplicated: Secondary | ICD-10-CM

## 2020-04-17 DIAGNOSIS — E1169 Type 2 diabetes mellitus with other specified complication: Secondary | ICD-10-CM | POA: Diagnosis not present

## 2020-04-17 DIAGNOSIS — I152 Hypertension secondary to endocrine disorders: Secondary | ICD-10-CM

## 2020-04-17 DIAGNOSIS — E119 Type 2 diabetes mellitus without complications: Secondary | ICD-10-CM | POA: Diagnosis not present

## 2020-04-17 DIAGNOSIS — F4310 Post-traumatic stress disorder, unspecified: Secondary | ICD-10-CM

## 2020-04-17 DIAGNOSIS — E785 Hyperlipidemia, unspecified: Secondary | ICD-10-CM

## 2020-04-17 LAB — BAYER DCA HB A1C WAIVED: HB A1C (BAYER DCA - WAIVED): 7.1 % — ABNORMAL HIGH (ref <7.0)

## 2020-04-17 MED ORDER — VENLAFAXINE HCL ER 75 MG PO CP24: 225.0000 mg | ORAL_CAPSULE | Freq: Every day | ORAL | 0 refills | Status: DC

## 2020-04-17 MED ORDER — ALLOPURINOL 300 MG PO TABS: 300.0000 mg | ORAL_TABLET | Freq: Every day | ORAL | 1 refills | Status: DC

## 2020-04-17 MED ORDER — BACLOFEN 10 MG PO TABS: 10.0000 mg | ORAL_TABLET | Freq: Three times a day (TID) | ORAL | 0 refills | Status: DC

## 2020-04-17 MED ORDER — ENALAPRIL MALEATE 5 MG PO TABS: 5.0000 mg | ORAL_TABLET | Freq: Every day | ORAL | 1 refills | Status: DC

## 2020-04-17 MED ORDER — DICLOFENAC SODIUM 75 MG PO TBEC: 75.0000 mg | DELAYED_RELEASE_TABLET | Freq: Two times a day (BID) | ORAL | 0 refills | Status: DC

## 2020-04-17 MED ORDER — TRAZODONE HCL 100 MG PO TABS: 100.0000 mg | ORAL_TABLET | Freq: Every day | ORAL | 1 refills | Status: DC

## 2020-04-17 MED ORDER — HYDROCHLOROTHIAZIDE 25 MG PO TABS: 25.0000 mg | ORAL_TABLET | Freq: Every day | ORAL | 0 refills | Status: DC

## 2020-04-17 MED ORDER — PANTOPRAZOLE SODIUM 40 MG PO TBEC: 40.0000 mg | DELAYED_RELEASE_TABLET | Freq: Every day | ORAL | 1 refills | Status: DC

## 2020-04-17 MED ORDER — AMLODIPINE BESYLATE 10 MG PO TABS: 10.0000 mg | ORAL_TABLET | Freq: Every day | ORAL | 0 refills | Status: DC

## 2020-04-17 MED ORDER — ATORVASTATIN CALCIUM 20 MG PO TABS: 20.0000 mg | ORAL_TABLET | Freq: Every day | ORAL | 3 refills | Status: DC

## 2020-04-17 NOTE — Progress Notes (Signed)
Subjective:    Patient ID: Frank Moses, male    DOB: 1965-07-31, 55 y.o.   MRN: [id number]  Chief Complaint  Patient presents with  . Diabetes  . Hypertension    PT presents to the office today for chronic follow up. He is followed by [facility] every 3 month for PTSD, depression, and Schizoaffective.   Reports drinking 1 beers a day.Has had three DUI's in the past, but trying to get his license back.   Diabetes He presents for his follow-up diabetic visit. He has type 2 diabetes mellitus. His disease course has been stable. Hypoglycemia symptoms include nervousness/anxiousness. Pertinent negatives for diabetes include no blurred vision and no foot paresthesias. There are no hypoglycemic complications. Symptoms are stable. Pertinent negatives for diabetic complications include no CVA, heart disease or peripheral neuropathy. Risk factors for coronary artery disease include dyslipidemia, diabetes mellitus, hypertension, male sex and sedentary lifestyle. He is following a generally healthy diet. His overall blood glucose range is 110-130 mg/dl. An ACE inhibitor/angiotensin II receptor blocker is being taken. Eye exam is current.  Hypertension This is a chronic problem. The current episode started more than 1 year ago. The problem has been resolved since onset. The problem is controlled. Associated symptoms include anxiety and neck pain. Pertinent negatives include no blurred vision, malaise/fatigue, peripheral edema or shortness of breath. Risk factors for coronary artery disease include dyslipidemia, obesity and male gender. The current treatment provides moderate improvement. There is no history of CVA.  Depression        This is a chronic problem.  The current episode started more than 1 year ago.   The onset quality is gradual.   The problem occurs intermittently.  Associated symptoms include insomnia, irritable, restlessness and sad.  Associated symptoms include no helplessness and  no hopelessness.  Past treatments include SNRIs - Serotonin and norepinephrine reuptake inhibitors.  Compliance with treatment is good.  Past medical history includes anxiety.   Anxiety Presents for follow-up visit. Symptoms include depressed mood, insomnia, irritability, nervous/anxious behavior and restlessness. Patient reports no shortness of breath. Symptoms occur most days. The severity of symptoms is moderate.    Hyperlipidemia This is a chronic problem. The current episode started more than 1 year ago. The problem is controlled. Pertinent negatives include no shortness of breath. Current antihyperlipidemic treatment includes statins. The current treatment provides moderate improvement of lipids. Risk factors for coronary artery disease include dyslipidemia, diabetes mellitus, hypertension and a sedentary lifestyle.  Neck Pain  This is a new problem. The current episode started 1 to 4 weeks ago. The problem occurs intermittently. The pain is associated with nothing. The quality of the pain is described as aching. The pain is at a severity of 7/10. The pain is moderate. The symptoms are aggravated by twisting. He has tried bed rest and NSAIDs for the symptoms. The treatment provided mild relief.  Gastroesophageal Reflux He complains of belching and heartburn. This is a chronic problem. The current episode started more than 1 year ago. The problem occurs occasionally. He has tried a PPI for the symptoms. The treatment provided moderate relief.  Insomnia Primary symptoms: difficulty falling asleep, frequent awakening, no malaise/fatigue.  The current episode started more than one year. The onset quality is gradual. The problem occurs intermittently. PMH includes: depression.  Nicotine Dependence Presents for follow-up visit. Symptoms include insomnia and irritability. His urge triggers include company of smokers. The symptoms have been stable. He smokes 1 pack of cigarettes per day.  Review of Systems  Constitutional: Positive for irritability. Negative for malaise/fatigue.  Eyes: Negative for blurred vision.  Respiratory: Negative for shortness of breath.   Gastrointestinal: Positive for heartburn.  Musculoskeletal: Positive for neck pain.  Psychiatric/Behavioral: Positive for depression. The patient is nervous/anxious and has insomnia.   All other systems reviewed and are negative.      Objective:   Physical Exam Vitals reviewed.  Constitutional:      General: He is irritable. He is not in acute distress.    Appearance: He is well-developed.  HENT:     Head: Normocephalic.     Right Ear: Tympanic membrane normal.     Left Ear: Tympanic membrane normal.  Eyes:     General:        Right eye: No discharge.        Left eye: No discharge.     Pupils: Pupils are equal, round, and reactive to light.  Neck:     Thyroid: No thyromegaly.  Cardiovascular:     Rate and Rhythm: Normal rate and regular rhythm.     Heart sounds: Normal heart sounds. No murmur heard.   Pulmonary:     Effort: Pulmonary effort is normal. No respiratory distress.     Breath sounds: Normal breath sounds. No wheezing.  Abdominal:     General: Bowel sounds are normal. There is no distension.     Palpations: Abdomen is soft.     Tenderness: There is no abdominal tenderness.  Musculoskeletal:        General: No tenderness. Normal range of motion.     Cervical back: Normal range of motion and neck supple.  Skin:    General: Skin is warm and dry.     Findings: No erythema or rash.  Neurological:     Mental Status: He is alert and oriented to person, place, and time.     Cranial Nerves: No cranial nerve deficit.     Deep Tendon Reflexes: Reflexes are normal and symmetric.  Psychiatric:        Behavior: Behavior normal.        Thought Content: Thought content normal.        Judgment: Judgment normal.       BP (!) 148/89   Pulse 89   Temp (!) 97.4 F (36.3 C)  (Temporal)   Ht 6' 2"  (1.88 m)   Wt 195 lb 6.4 oz (88.6 kg)   BMI 25.09 kg/m      Assessment & Plan:  Frank Moses comes in today with chief complaint of Diabetes and Hypertension   Diagnosis and orders addressed:  1. 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  Dispense: 90 tablet; Refill: 1 - CMP14+EGFR - CBC with Differential/Platelet  2. Gastroesophageal reflux disease without esophagitis - pantoprazole (PROTONIX) 40 MG tablet; Take 1 tablet (40 mg total) by mouth daily. For acid reflux  Dispense: 90 tablet; Refill: 1 - CMP14+EGFR - CBC with Differential/Platelet  3. Hyperlipidemia associated with type 2 diabetes mellitus (HCC) - atorvastatin (LIPITOR) 20 MG tablet; Take 1 tablet (20 mg total) by mouth daily.  Dispense: 90 tablet; Refill: 3 - CMP14+EGFR - CBC with Differential/Platelet  4. Hypertension associated with type 2 diabetes mellitus ([place]) -Increase Vasotec increased to 5 mg from 2.5 mg  - hydrochlorothiazide (HYDRODIURIL) 25 MG tablet; Take 1 tablet (25 mg total) by mouth daily. For high  blood pressure  Dispense: 90 tablet; Refill: 0 - amLODipine (NORVASC) 10 MG tablet; Take 1 tablet (10 mg total) by mouth daily.  Dispense: 90 tablet; Refill: 0 - enalapril (VASOTEC) 5 MG tablet; Take 1 tablet (5 mg total) by mouth daily.  Dispense: 90 tablet; Refill: 1 - CMP14+EGFR - CBC with Differential/Platelet  5. Type 2 diabetes mellitus without complication, with no history of insulin use (HCC) - Bayer DCA Hb A1c Waived - CMP14+EGFR - CBC with Differential/Platelet  6. Recurrent major depressive disorder, in partial remission (HCC) - venlafaxine XR (EFFEXOR-XR) 75 MG 24 hr capsule; Take 3 capsules (225 mg total) by mouth daily with breakfast.  Dispense: 90 capsule; Refill: 0 - CMP14+EGFR - CBC with Differential/Platelet  7. H/O cervical spine surgery - DG  Cervical Spine Complete; Future - diclofenac (VOLTAREN) 75 MG EC tablet; Take 1 tablet (75 mg total) by mouth 2 (two) times daily.  Dispense: 30 tablet; Refill: 0 - baclofen (LIORESAL) 10 MG tablet; Take 1 tablet (10 mg total) by mouth 3 (three) times daily.  Dispense: 30 each; Refill: 0 - CMP14+EGFR - CBC with Differential/Platelet  8. Alcohol use disorder, severe, dependence (HCC) - CMP14+EGFR - CBC with Differential/Platelet  9. PTSD (post-traumatic stress disorder) - CMP14+EGFR - CBC with Differential/Platelet  10. MDD (major depressive disorder), recurrent, severe, with psychosis ([place]) - CMP14+EGFR - CBC with Differential/Platelet  11. Neck pain Rest No other NSAID's  Do not drink while taking baclofen  - DG Cervical Spine Complete; Future - diclofenac (VOLTAREN) 75 MG EC tablet; Take 1 tablet (75 mg total) by mouth 2 (two) times daily.  Dispense: 30 tablet; Refill: 0 - baclofen (LIORESAL) 10 MG tablet; Take 1 tablet (10 mg total) by mouth 3 (three) times daily.  Dispense: 30 each; Refill: 0 - CMP14+EGFR - CBC with Differential/Platelet   Labs pending Health Maintenance reviewed Diet and exercise encouraged  Follow up plan: 2 weeks to recheck HTN and neck pain   Evelina Dun, FNP

## 2020-04-17 NOTE — Patient Instructions (Signed)

## 2020-04-18 LAB — CMP14+EGFR
ALT: 37 IU/L (ref 0–44)
AST: 32 IU/L (ref 0–40)
Albumin/Globulin Ratio: 1.7 (ref 1.2–2.2)
Albumin: 4.7 g/dL (ref 3.8–4.9)
Alkaline Phosphatase: 100 IU/L (ref 44–121)
BUN/Creatinine Ratio: 9 (ref 9–20)
BUN: 7 mg/dL (ref 6–24)
Bilirubin Total: 0.4 mg/dL (ref 0.0–1.2)
CO2: 23 mmol/L (ref 20–29)
Calcium: 10 mg/dL (ref 8.7–10.2)
Chloride: 103 mmol/L (ref 96–106)
Creatinine, Ser: 0.75 mg/dL — ABNORMAL LOW (ref 0.76–1.27)
Globulin, Total: 2.8 g/dL (ref 1.5–4.5)
Glucose: 130 mg/dL — ABNORMAL HIGH (ref 65–99)
Potassium: 4.3 mmol/L (ref 3.5–5.2)
Sodium: 143 mmol/L (ref 134–144)
Total Protein: 7.5 g/dL (ref 6.0–8.5)
eGFR: 107 mL/min/{1.73_m2} (ref >59)

## 2020-04-18 LAB — CBC WITH DIFFERENTIAL/PLATELET
Basophils Absolute: 0.1 10*3/uL (ref 0.0–0.2)
Basos: 1 %
EOS (ABSOLUTE): 0.3 10*3/uL (ref 0.0–0.4)
Eos: 4 %
Hematocrit: 43.9 % (ref 37.5–51.0)
Hemoglobin: 15.6 g/dL (ref 13.0–17.7)
Immature Grans (Abs): 0.1 10*3/uL (ref 0.0–0.1)
Immature Granulocytes: 1 %
Lymphocytes Absolute: 2.2 10*3/uL (ref 0.7–3.1)
Lymphs: 29 %
MCH: 32.6 pg (ref 26.6–33.0)
MCHC: 35.5 g/dL (ref 31.5–35.7)
MCV: 92 fL (ref 79–97)
Monocytes Absolute: 0.6 10*3/uL (ref 0.1–0.9)
Monocytes: 8 %
Neutrophils Absolute: 4.3 10*3/uL (ref 1.4–7.0)
Neutrophils: 57 %
Platelets: 226 10*3/uL (ref 150–450)
RBC: 4.78 x10E6/uL (ref 4.14–5.80)
RDW: 13.1 % (ref 11.6–15.4)
WBC: 7.5 10*3/uL (ref 3.4–10.8)

## 2020-04-18 LAB — MICROALBUMIN / CREATININE URINE RATIO
Creatinine, Urine: 112.1 mg/dL
Microalb/Creat Ratio: 168 mg/g creat — ABNORMAL HIGH (ref 0–29)
Microalbumin, Urine: 188.3 ug/mL

## 2020-05-01 ENCOUNTER — Other Ambulatory Visit: Payer: Self-pay

## 2020-05-01 ENCOUNTER — Encounter: Payer: Self-pay | Admitting: Family

## 2020-05-01 ENCOUNTER — Ambulatory Visit (INDEPENDENT_AMBULATORY_CARE_PROVIDER_SITE_OTHER): Payer: Medicare Other | Admitting: Family

## 2020-05-01 VITALS — BP 140/78 | HR 101 | Temp 98.0°F | Ht 74.0 in | Wt 197.2 lb

## 2020-05-01 DIAGNOSIS — E1159 Type 2 diabetes mellitus with other circulatory complications: Secondary | ICD-10-CM

## 2020-05-01 DIAGNOSIS — M542 Cervicalgia: Secondary | ICD-10-CM

## 2020-05-01 DIAGNOSIS — I152 Hypertension secondary to endocrine disorders: Secondary | ICD-10-CM

## 2020-05-01 DIAGNOSIS — Z9889 Other specified postprocedural states: Secondary | ICD-10-CM

## 2020-05-01 MED ORDER — BACLOFEN 10 MG PO TABS
10.0000 mg | ORAL_TABLET | Freq: Three times a day (TID) | ORAL | 2 refills | Status: DC
Start: 2020-05-01 — End: 2021-12-05

## 2020-05-01 NOTE — Patient Instructions (Signed)

## 2020-05-01 NOTE — Progress Notes (Signed)
Subjective:    Patient ID: Frank Moses, male    DOB: 01-10-1966, 55 y.o.   MRN: [id number]  Chief Complaint  Patient presents with  . Hypertension  . Neck Pain   PT presents to the office today to recheck HTN and neck pain. He was seen on 04/17/20 and we increased his Vasotec to 5 mg from 2.5 mg. His BP is not at goal. He does report his anxiety is high right now, because his water was turned off by mistake and had to call and get it back on.   He reports his neck has improved, but still hurting at night.  Hypertension This is a chronic problem. The current episode started more than 1 year ago. The problem has been waxing and waning since onset. The problem is uncontrolled. Associated symptoms include neck pain. Pertinent negatives include no malaise/fatigue, peripheral edema or shortness of breath. Risk factors for coronary artery disease include obesity and male gender. The current treatment provides moderate improvement.  Neck Pain  This is a chronic problem. The current episode started more than 1 year ago. The problem occurs intermittently. The problem has been waxing and waning. The pain is associated with a sleep position. The quality of the pain is described as aching. The pain is at a severity of 5/10. The pain is moderate.      Review of Systems  Constitutional: Negative for malaise/fatigue.  Respiratory: Negative for shortness of breath.   Musculoskeletal: Positive for neck pain.  All other systems reviewed and are negative.      Objective:   Physical Exam Vitals reviewed.  Constitutional:      General: He is not in acute distress.    Appearance: He is well-developed.  HENT:     Head: Normocephalic.     Right Ear: Tympanic membrane normal.     Left Ear: Tympanic membrane normal.  Eyes:     General:        Right eye: No discharge.        Left eye: No discharge.     Pupils: Pupils are equal, round, and reactive to light.  Neck:     Thyroid: No thyromegaly.   Cardiovascular:     Rate and Rhythm: Normal rate and regular rhythm.     Heart sounds: Normal heart sounds. No murmur heard.   Pulmonary:     Effort: Pulmonary effort is normal. No respiratory distress.     Breath sounds: Normal breath sounds. No wheezing.  Abdominal:     General: Bowel sounds are normal. There is no distension.     Palpations: Abdomen is soft.     Tenderness: There is no abdominal tenderness.  Musculoskeletal:        General: No tenderness. Normal range of motion.     Cervical back: Normal range of motion and neck supple.  Skin:    General: Skin is warm and dry.     Findings: No erythema or rash.  Neurological:     Mental Status: He is alert and oriented to person, place, and time.     Cranial Nerves: No cranial nerve deficit.     Deep Tendon Reflexes: Reflexes are normal and symmetric.  Psychiatric:        Behavior: Behavior normal.        Thought Content: Thought content normal.        Judgment: Judgment normal.          BP 140/78   Pulse Marland Kitchen)  101   Temp 98 F (36.7 C) (Temporal)   Ht 6\' 2"  (1.88 m)   Wt 197 lb 3.2 oz (89.4 kg)   BMI 25.32 kg/m   Assessment & Plan:  Frank Moses comes in today with chief complaint of Hypertension and Neck Pain   Diagnosis and orders addressed: 1. Hypertension associated with type 2 diabetes mellitus ([place])  2. H/O cervical spine surgery - baclofen (LIORESAL) 10 MG tablet; Take 1 tablet (10 mg total) by mouth 3 (three) times daily.  Dispense: 60 each; Refill: 2  3. Neck pain - baclofen (LIORESAL) 10 MG tablet; Take 1 tablet (10 mg total) by mouth 3 (three) times daily.  Dispense: 60 each; Refill: 2 Continue medications -Daily blood pressure log given with instructions on how to fill out and told to bring to next visit -Dash diet information given -Exercise encouraged - Stress Management  Health Maintenance reviewed Diet and exercise encouraged  Follow up plan: 6 months    Evelina Dun, FNP

## 2020-05-28 ENCOUNTER — Other Ambulatory Visit: Payer: Self-pay | Admitting: Family

## 2020-05-28 DIAGNOSIS — E119 Type 2 diabetes mellitus without complications: Secondary | ICD-10-CM

## 2020-06-12 ENCOUNTER — Other Ambulatory Visit: Payer: Self-pay | Admitting: Family

## 2020-06-12 DIAGNOSIS — E1169 Type 2 diabetes mellitus with other specified complication: Secondary | ICD-10-CM

## 2020-07-16 ENCOUNTER — Other Ambulatory Visit: Payer: Self-pay | Admitting: Family

## 2020-07-16 DIAGNOSIS — M1A0720 Idiopathic chronic gout, left ankle and foot, without tophus (tophi): Secondary | ICD-10-CM

## 2020-08-30 ENCOUNTER — Other Ambulatory Visit: Payer: Self-pay | Admitting: Family

## 2020-08-30 DIAGNOSIS — E1159 Type 2 diabetes mellitus with other circulatory complications: Secondary | ICD-10-CM

## 2020-09-21 ENCOUNTER — Other Ambulatory Visit: Payer: Self-pay | Admitting: Family

## 2020-09-21 ENCOUNTER — Telehealth: Payer: Self-pay | Admitting: Family

## 2020-09-21 DIAGNOSIS — K219 Gastro-esophageal reflux disease without esophagitis: Secondary | ICD-10-CM

## 2020-09-21 NOTE — Telephone Encounter (Signed)
done

## 2020-09-21 NOTE — Telephone Encounter (Signed)
  Prescription Request  09/21/2020  Is this a "Controlled Substance" medicine? Have you seen your PCP in the last 2 weeks? If YES, route message to pool  -  If NO, patient needs to be seen.  What is the name of the medication or equipment? Pantoprazole 40 mg,Hydrochlorothiazide 25 mg. Patient has appt 9-1 with Alyse Low and needs enough called in to get him through  Have you contacted your pharmacy to request a refil? YES  Which pharmacy would you like this sent to? [facility]   Patient notified that their request is being sent to the clinical staff for review and that they should receive a response within 2 business days.

## 2020-09-27 ENCOUNTER — Other Ambulatory Visit: Payer: Self-pay

## 2020-09-27 ENCOUNTER — Ambulatory Visit (INDEPENDENT_AMBULATORY_CARE_PROVIDER_SITE_OTHER): Payer: Medicare Other | Admitting: Family

## 2020-09-27 ENCOUNTER — Encounter: Payer: Self-pay | Admitting: Family

## 2020-09-27 VITALS — BP 129/75 | HR 92 | Temp 97.3°F | Ht 74.0 in | Wt 192.2 lb

## 2020-09-27 DIAGNOSIS — E1129 Type 2 diabetes mellitus with other diabetic kidney complication: Secondary | ICD-10-CM

## 2020-09-27 DIAGNOSIS — I152 Hypertension secondary to endocrine disorders: Secondary | ICD-10-CM | POA: Diagnosis not present

## 2020-09-27 DIAGNOSIS — F4310 Post-traumatic stress disorder, unspecified: Secondary | ICD-10-CM

## 2020-09-27 DIAGNOSIS — E1169 Type 2 diabetes mellitus with other specified complication: Secondary | ICD-10-CM

## 2020-09-27 DIAGNOSIS — E785 Hyperlipidemia, unspecified: Secondary | ICD-10-CM

## 2020-09-27 DIAGNOSIS — E1159 Type 2 diabetes mellitus with other circulatory complications: Secondary | ICD-10-CM

## 2020-09-27 DIAGNOSIS — K219 Gastro-esophageal reflux disease without esophagitis: Secondary | ICD-10-CM

## 2020-09-27 DIAGNOSIS — F17210 Nicotine dependence, cigarettes, uncomplicated: Secondary | ICD-10-CM | POA: Diagnosis not present

## 2020-09-27 DIAGNOSIS — F1020 Alcohol dependence, uncomplicated: Secondary | ICD-10-CM

## 2020-09-27 DIAGNOSIS — R809 Proteinuria, unspecified: Secondary | ICD-10-CM

## 2020-09-27 DIAGNOSIS — F333 Major depressive disorder, recurrent, severe with psychotic symptoms: Secondary | ICD-10-CM

## 2020-09-27 DIAGNOSIS — E119 Type 2 diabetes mellitus without complications: Secondary | ICD-10-CM

## 2020-09-27 DIAGNOSIS — M1A0720 Idiopathic chronic gout, left ankle and foot, without tophus (tophi): Secondary | ICD-10-CM

## 2020-09-27 DIAGNOSIS — F3341 Major depressive disorder, recurrent, in partial remission: Secondary | ICD-10-CM

## 2020-09-27 LAB — BAYER DCA HB A1C WAIVED: HB A1C (BAYER DCA - WAIVED): 7.1 % — ABNORMAL HIGH (ref <7.0)

## 2020-09-27 MED ORDER — LISINOPRIL 20 MG PO TABS: 20.0000 mg | ORAL_TABLET | Freq: Every day | ORAL | 3 refills | Status: DC

## 2020-09-27 NOTE — Patient Instructions (Signed)
Gout  Gout is a condition that causes painful swelling of the joints. Gout is a type of inflammation of the joints (arthritis). This condition is caused by having too much uric acid in the body. Uric acid is a chemical that forms when the body breaks down substances called purines.Purines are important for building body proteins. When the body has too much uric acid, sharp crystals can form and build up inside the joints. This causes pain and swelling. Gout attacks can happen quickly and may be very painful (acute gout). Over time, the attacks can affect more joints and become more frequent (chronic gout). Gout can also cause uric acid to build up under the skin and inside thekidneys. What are the causes? This condition is caused by too much uric acid in your blood. This can happen because: Your kidneys do not remove enough uric acid from your blood. This is the most common cause. Your body makes too much uric acid. This can happen with some cancers and cancer treatments. It can also occur if your body is breaking down too many red blood cells (hemolytic anemia). You eat too many foods that are high in purines. These foods include organ meats and some seafood. Alcohol, especially beer, is also high in purines. A gout attack may be triggered by trauma or stress. What increases the risk? You are more likely to develop this condition if you: Have a family history of gout. Are male and middle-aged. Are male and have gone through menopause. Are obese. Frequently drink alcohol, especially beer. Are dehydrated. Lose weight too quickly. Have an organ transplant. Have lead poisoning. Take certain medicines, including aspirin, cyclosporine, diuretics, levodopa, and niacin. Have kidney disease. Have a skin condition called psoriasis. What are the signs or symptoms? An attack of acute gout happens quickly. It usually occurs in just one joint. The most common place is the big toe. Attacks often start  at night. Other joints that may be affected include joints of the feet, ankle, knee, fingers, wrist, or elbow. Symptoms of this condition may include: Severe pain. Warmth. Swelling. Stiffness. Tenderness. The affected joint may be very painful to touch. Shiny, red, or purple skin. Chills and fever. Chronic gout may cause symptoms more frequently. More joints may be involved. You may also have white or yellow lumps (tophi) on your hands or feet or in other areas near your joints. How is this diagnosed? This condition is diagnosed based on your symptoms, medical history, and physical exam. You may have tests, such as: Blood tests to measure uric acid levels. Removal of joint fluid with a thin needle (aspiration) to look for uric acid crystals. X-rays to look for joint damage. How is this treated? Treatment for this condition has two phases: treating an acute attack and preventing future attacks. Acute gout treatment may include medicines to reduce pain and swelling, including: NSAIDs. Steroids. These are strong anti-inflammatory medicines that can be taken by mouth (orally) or injected into a joint. Colchicine. This medicine relieves pain and swelling when it is taken soon after an attack. It can be given by mouth or through an IV. Preventive treatment may include: Daily use of smaller doses of NSAIDs or colchicine. Use of a medicine that reduces uric acid levels in your blood. Changes to your diet. You may need to see a dietitian about what to eat and drink to prevent gout. Follow these instructions at home: During a gout attack  If directed, put ice on the affected area: Put   ice in a plastic bag. Place a towel between your skin and the bag. Leave the ice on for 20 minutes, 2-3 times a day. Raise (elevate) the affected joint above the level of your heart as often as possible. Rest the joint as much as possible. If the affected joint is in your leg, you may be given crutches to  use. Follow instructions from your health care provider about eating or drinking restrictions.  Avoiding future gout attacks Follow a low-purine diet as told by your dietitian or health care provider. Avoid foods and drinks that are high in purines, including liver, kidney, anchovies, asparagus, herring, mushrooms, mussels, and beer. Maintain a healthy weight or lose weight if you are overweight. If you want to lose weight, talk with your health care provider. It is important that you do not lose weight too quickly. Start or maintain an exercise program as told by your health care provider. Eating and drinking Drink enough fluids to keep your urine pale yellow. If you drink alcohol: Limit how much you use to: 0-1 drink a day for women. 0-2 drinks a day for men. Be aware of how much alcohol is in your drink. In the [place] one drink equals one 12 oz bottle of beer (355 mL) one 5 oz glass of wine (148 mL), or one 1 oz glass of hard liquor (44 mL). General instructions Take over-the-counter and prescription medicines only as told by your health care provider. Do not drive or use heavy machinery while taking prescription pain medicine. Return to your normal activities as told by your health care provider. Ask your health care provider what activities are safe for you. Keep all follow-up visits as told by your health care provider. This is important. Contact a health care provider if you have: Another gout attack. Continuing symptoms of a gout attack after 10 days of treatment. Side effects from your medicines. Chills or a fever. Burning pain when you urinate. Pain in your lower back or belly. Get help right away if you: Have severe or uncontrolled pain. Cannot urinate. Summary Gout is painful swelling of the joints caused by inflammation. The most common site of pain is the big toe, but it can affect other joints in the body. Medicines and dietary changes can help to prevent and treat gout  attacks. This information is not intended to replace advice given to you by your health care provider. Make sure you discuss any questions you have with your healthcare provider. Document Revised: 08/05/2017 Document Reviewed: 08/05/2017 Elsevier Patient Education  2022 [employer]  

## 2020-09-27 NOTE — Progress Notes (Signed)
Subjective:    Patient ID: Frank Moses, male    DOB: 1965-10-28, 55 y.o.   MRN: [id number]  Chief Complaint  Patient presents with   Medical Management of Chronic Issues   PT presents to the office today for chronic follow up. He is followed by [facility] every 6 month for PTSD, depression, and Schizoaffective.    Reports drinking 1-2 beers a day. Has had three DUI's in the past, but trying to get his license back.  Hypertension This is a chronic problem. The current episode started more than 1 year ago. The problem has been waxing and waning since onset. Pertinent negatives include no blurred vision, malaise/fatigue, peripheral edema or shortness of breath. Risk factors for coronary artery disease include dyslipidemia, obesity and male gender. The current treatment provides moderate improvement.  Gastroesophageal Reflux He complains of belching and heartburn. This is a chronic problem. The current episode started more than 1 year ago. The problem occurs occasionally. The problem has been waxing and waning. He has tried a PPI for the symptoms. The treatment provided moderate relief.  Diabetes He presents for his follow-up diabetic visit. He has type 2 diabetes mellitus. Pertinent negatives for diabetes include no blurred vision and no foot paresthesias. Symptoms are stable. Risk factors for coronary artery disease include dyslipidemia, diabetes mellitus, male sex, hypertension and sedentary lifestyle. He is following a generally unhealthy diet. An ACE inhibitor/angiotensin II receptor blocker is being taken.  Hyperlipidemia This is a chronic problem. The current episode started more than 1 year ago. Exacerbating diseases include obesity. Pertinent negatives include no shortness of breath. Current antihyperlipidemic treatment includes statins. The current treatment provides moderate improvement of lipids. Risk factors for coronary artery disease include dyslipidemia, diabetes mellitus,  hypertension and a sedentary lifestyle.  Depression        This is a chronic problem.  The current episode started more than 1 year ago.   The problem occurs intermittently.  Associated symptoms include helplessness, hopelessness, irritable, restlessness and sad.  Past treatments include SNRIs - Serotonin and norepinephrine reuptake inhibitors. Nicotine Dependence Presents for follow-up visit. His urge triggers include company of smokers. The symptoms have been stable. He smokes < 1/2 a pack of cigarettes per day.  Gout  Pt states he is getting gout flare up ever few months. He is taking allopurinol 300 mg daily. However, he is on HCTZ 25 mg dialy also.    Review of Systems  Constitutional:  Negative for malaise/fatigue.  Eyes:  Negative for blurred vision.  Respiratory:  Negative for shortness of breath.   Gastrointestinal:  Positive for heartburn.  Psychiatric/Behavioral:  Positive for depression.   All other systems reviewed and are negative.     Objective:   Physical Exam Vitals reviewed.  Constitutional:      General: He is irritable. He is not in acute distress.    Appearance: He is well-developed.  HENT:     Head: Normocephalic.     Right Ear: Tympanic membrane normal.     Left Ear: Tympanic membrane normal.  Eyes:     General:        Right eye: No discharge.        Left eye: No discharge.     Pupils: Pupils are equal, round, and reactive to light.  Neck:     Thyroid: No thyromegaly.  Cardiovascular:     Rate and Rhythm: Normal rate and regular rhythm.     Heart sounds: Normal heart sounds. No  murmur heard. Pulmonary:     Effort: Pulmonary effort is normal. No respiratory distress.     Breath sounds: Normal breath sounds. No wheezing.  Abdominal:     General: Bowel sounds are normal. There is no distension.     Palpations: Abdomen is soft.     Tenderness: There is no abdominal tenderness.  Musculoskeletal:        General: No tenderness. Normal range of motion.      Cervical back: Normal range of motion and neck supple.  Skin:    General: Skin is warm and dry.     Findings: No erythema or rash.  Neurological:     Mental Status: He is alert and oriented to person, place, and time.     Cranial Nerves: No cranial nerve deficit.     Deep Tendon Reflexes: Reflexes are normal and symmetric.  Psychiatric:        Behavior: Behavior normal.        Thought Content: Thought content normal.        Judgment: Judgment normal.      BP (!) 155/83   Pulse 92   Temp (!) 97.3 F (36.3 C) (Temporal)   Ht 6' 2"  (1.88 m)   Wt 192 lb 3.2 oz (87.2 kg)   SpO2 95%   BMI 24.68 kg/m      Assessment & Plan:  Frank Moses comes in today with chief complaint of Medical Management of Chronic Issues   Diagnosis and orders addressed:  1. Hypertension associated with type 2 diabetes mellitus ([place]) Stop HCTZ 25 mg related to gout flare ups, start Lisinopril 20 mg  -Daily blood pressure log given with instructions on how to fill out and told to bring to next visit -Dash diet information given -Exercise encouraged - Stress Management  -Continue current meds -RTO in 2 weeks  - CMP14+EGFR - CBC with Differential/Platelet - lisinopril (ZESTRIL) 20 MG tablet; Take 1 tablet (20 mg total) by mouth daily.  Dispense: 90 tablet; Refill: 3  2. Gastroesophageal reflux disease without esophagitis - CMP14+EGFR - CBC with Differential/Platelet  3. Hyperlipidemia associated with type 2 diabetes mellitus ([facility]) - CMP14+EGFR - CBC with Differential/Platelet  4. Microalbuminuria due to type 2 diabetes mellitus ([facility]) - CMP14+EGFR - CBC with Differential/Platelet  5. Type 2 diabetes mellitus without complication, with no history of insulin use ([facility]) - CMP14+EGFR - CBC with Differential/Platelet - Bayer DCA Hb A1c Waived  6. Chronic idiopathic gout involving toe of left foot without tophus  - CMP14+EGFR - CBC with Differential/Platelet - Uric acid  7. Cigarette  nicotine dependence without complication - FTD32+KGUR - CBC with Differential/Platelet  8. PTSD (post-traumatic stress disorder) - CMP14+EGFR - CBC with Differential/Platelet  9. Alcohol use disorder, severe, dependence ([facility]) - CMP14+EGFR - CBC with Differential/Platelet  10. MDD (major depressive disorder), recurrent, severe, with psychosis ([place]) - CMP14+EGFR - CBC with Differential/Platelet  11. Recurrent major depressive disorder, in partial remission ([place])  - CMP14+EGFR - CBC with Differential/Platelet   Labs pending Health Maintenance reviewed Diet and exercise encouraged  Follow up plan: 2 weeks recheck HTN   Evelina Dun, FNP

## 2020-09-28 LAB — CBC WITH DIFFERENTIAL/PLATELET
Basophils Absolute: 0 10*3/uL (ref 0.0–0.2)
Basos: 0 %
EOS (ABSOLUTE): 0.3 10*3/uL (ref 0.0–0.4)
Eos: 3 %
Hematocrit: 44.5 % (ref 37.5–51.0)
Hemoglobin: 15.4 g/dL (ref 13.0–17.7)
Immature Grans (Abs): 0 10*3/uL (ref 0.0–0.1)
Immature Granulocytes: 0 %
Lymphocytes Absolute: 2.2 10*3/uL (ref 0.7–3.1)
Lymphs: 23 %
MCH: 32.4 pg (ref 26.6–33.0)
MCHC: 34.6 g/dL (ref 31.5–35.7)
MCV: 94 fL (ref 79–97)
Monocytes Absolute: 0.8 10*3/uL (ref 0.1–0.9)
Monocytes: 9 %
Neutrophils Absolute: 6.2 10*3/uL (ref 1.4–7.0)
Neutrophils: 65 %
Platelets: 239 10*3/uL (ref 150–450)
RBC: 4.76 x10E6/uL (ref 4.14–5.80)
RDW: 13.2 % (ref 11.6–15.4)
WBC: 9.6 10*3/uL (ref 3.4–10.8)

## 2020-09-28 LAB — CMP14+EGFR
ALT: 27 IU/L (ref 0–44)
AST: 28 IU/L (ref 0–40)
Albumin/Globulin Ratio: 1.7 (ref 1.2–2.2)
Albumin: 4.7 g/dL (ref 3.8–4.9)
Alkaline Phosphatase: 115 IU/L (ref 44–121)
BUN/Creatinine Ratio: 9 (ref 9–20)
BUN: 7 mg/dL (ref 6–24)
Bilirubin Total: 0.5 mg/dL (ref 0.0–1.2)
CO2: 27 mmol/L (ref 20–29)
Calcium: 9.6 mg/dL (ref 8.7–10.2)
Chloride: 97 mmol/L (ref 96–106)
Creatinine, Ser: 0.82 mg/dL (ref 0.76–1.27)
Globulin, Total: 2.8 g/dL (ref 1.5–4.5)
Glucose: 122 mg/dL — ABNORMAL HIGH (ref 65–99)
Potassium: 4 mmol/L (ref 3.5–5.2)
Sodium: 139 mmol/L (ref 134–144)
Total Protein: 7.5 g/dL (ref 6.0–8.5)
eGFR: 104 mL/min/{1.73_m2} (ref >59)

## 2020-09-28 LAB — URIC ACID: Uric Acid: 4.2 mg/dL (ref 3.8–8.4)

## 2020-10-06 ENCOUNTER — Other Ambulatory Visit: Payer: Self-pay | Admitting: Family

## 2020-10-06 DIAGNOSIS — E1159 Type 2 diabetes mellitus with other circulatory complications: Secondary | ICD-10-CM

## 2020-10-12 ENCOUNTER — Other Ambulatory Visit: Payer: Self-pay

## 2020-10-12 ENCOUNTER — Ambulatory Visit (INDEPENDENT_AMBULATORY_CARE_PROVIDER_SITE_OTHER): Payer: Medicare Other | Admitting: Family

## 2020-10-12 ENCOUNTER — Encounter: Payer: Self-pay | Admitting: Family

## 2020-10-12 VITALS — BP 137/83 | HR 93 | Temp 98.0°F | Resp 20 | Ht 74.0 in | Wt 192.0 lb

## 2020-10-12 DIAGNOSIS — E1159 Type 2 diabetes mellitus with other circulatory complications: Secondary | ICD-10-CM | POA: Diagnosis not present

## 2020-10-12 DIAGNOSIS — K219 Gastro-esophageal reflux disease without esophagitis: Secondary | ICD-10-CM | POA: Diagnosis not present

## 2020-10-12 DIAGNOSIS — I152 Hypertension secondary to endocrine disorders: Secondary | ICD-10-CM

## 2020-10-12 MED ORDER — LOSARTAN POTASSIUM 50 MG PO TABS: 50.0000 mg | ORAL_TABLET | Freq: Every day | ORAL | 2 refills | Status: DC

## 2020-10-12 NOTE — Patient Instructions (Signed)
Gastroesophageal Reflux Disease, Adult ?Gastroesophageal reflux (GER) happens when acid from the stomach flows up into the tube that connects the mouth and the stomach (esophagus). Normally, food travels down the esophagus and stays in the stomach to be digested. However, when a person has GER, food and stomach acid sometimes move back up into the esophagus. If this becomes a more serious problem, the person may be diagnosed with a disease called gastroesophageal reflux disease (GERD). GERD occurs when the reflux: ?Happens often. ?Causes frequent or severe symptoms. ?Causes problems such as damage to the esophagus. ?When stomach acid comes in contact with the esophagus, the acid may cause inflammation in the esophagus. Over time, GERD may create small holes (ulcers) in the lining of the esophagus. ?What are the causes? ?This condition is caused by a problem with the muscle between the esophagus and the stomach (lower esophageal sphincter, or LES). Normally, the LES muscle closes after food passes through the esophagus to the stomach. When the LES is weakened or abnormal, it does not close properly, and that allows food and stomach acid to go back up into the esophagus. ?The LES can be weakened by certain dietary substances, medicines, and medical conditions, including: ?Tobacco use. ?Pregnancy. ?Having a hiatal hernia. ?Alcohol use. ?Certain foods and beverages, such as coffee, chocolate, onions, and peppermint. ?What increases the risk? ?You are more likely to develop this condition if you: ?Have an increased body weight. ?Have a connective tissue disorder. ?Take NSAIDs, such as ibuprofen. ?What are the signs or symptoms? ?Symptoms of this condition include: ?Heartburn. ?Difficult or painful swallowing and the feeling of having a lump in the throat. ?A bitter taste in the mouth. ?Bad breath and having a large amount of saliva. ?Having an upset or bloated stomach and belching. ?Chest pain. Different conditions can  cause chest pain. Make sure you see your health care provider if you experience chest pain. ?Shortness of breath or wheezing. ?Ongoing (chronic) cough or a nighttime cough. ?Wearing away of tooth enamel. ?Weight loss. ?How is this diagnosed? ?This condition may be diagnosed based on a medical history and a physical exam. To determine if you have mild or severe GERD, your health care provider may also monitor how you respond to treatment. You may also have tests, including: ?A test to examine your stomach and esophagus with a small camera (endoscopy). ?A test that measures the acidity level in your esophagus. ?A test that measures how much pressure is on your esophagus. ?A barium swallow or modified barium swallow test to show the shape, size, and functioning of your esophagus. ?How is this treated? ?Treatment for this condition may vary depending on how severe your symptoms are. Your health care provider may recommend: ?Changes to your diet. ?Medicine. ?Surgery. ?The goal of treatment is to help relieve your symptoms and to prevent complications. ?Follow these instructions at home: ?Eating and drinking ? ?Follow a diet as recommended by your health care provider. This may involve avoiding foods and drinks such as: ?Coffee and tea, with or without caffeine. ?Drinks that contain alcohol. ?Energy drinks and sports drinks. ?Carbonated drinks or sodas. ?Chocolate and cocoa. ?Peppermint and mint flavorings. ?Garlic and onions. ?Horseradish. ?Spicy and acidic foods, including peppers, chili powder, curry powder, vinegar, hot sauces, and barbecue sauce. ?Citrus fruit juices and citrus fruits, such as oranges, lemons, and limes. ?Tomato-based foods, such as red sauce, chili, salsa, and pizza with red sauce. ?Fried and fatty foods, such as donuts, french fries, potato chips, and high-fat dressings. ?  High-fat meats, such as hot dogs and fatty cuts of red and white meats, such as rib eye steak, sausage, ham, and  bacon. ?High-fat dairy items, such as whole milk, butter, and cream cheese. ?Eat small, frequent meals instead of large meals. ?Avoid drinking large amounts of liquid with your meals. ?Avoid eating meals during the 2-3 hours before bedtime. ?Avoid lying down right after you eat. ?Do not exercise right after you eat. ?Lifestyle ? ?Do not use any products that contain nicotine or tobacco. These products include cigarettes, chewing tobacco, and vaping devices, such as e-cigarettes. If you need help quitting, ask your health care provider. ?Try to reduce your stress by using methods such as yoga or meditation. If you need help reducing stress, ask your health care provider. ?If you are overweight, reduce your weight to an amount that is healthy for you. Ask your health care provider for guidance about a safe weight loss goal. ?General instructions ?Pay attention to any changes in your symptoms. ?Take over-the-counter and prescription medicines only as told by your health care provider. Do not take aspirin, ibuprofen, or other NSAIDs unless your health care provider told you to take these medicines. ?Wear loose-fitting clothing. Do not wear anything tight around your waist that causes pressure on your abdomen. ?Raise (elevate) the head of your bed about 6 inches (15 cm). You can use a wedge to do this. ?Avoid bending over if this makes your symptoms worse. ?Keep all follow-up visits. This is important. ?Contact a health care provider if: ?You have: ?New symptoms. ?Unexplained weight loss. ?Difficulty swallowing or it hurts to swallow. ?Wheezing or a persistent cough. ?A hoarse voice. ?Your symptoms do not improve with treatment. ?Get help right away if: ?You have sudden pain in your arms, neck, jaw, teeth, or back. ?You suddenly feel sweaty, dizzy, or light-headed. ?You have chest pain or shortness of breath. ?You vomit and the vomit is green, yellow, or black, or it looks like blood or coffee grounds. ?You faint. ?You  have stool that is red, bloody, or black. ?You cannot swallow, drink, or eat. ?These symptoms may represent a serious problem that is an emergency. Do not wait to see if the symptoms will go away. Get medical help right away. Call your local emergency services (911 in the [place] Do not drive yourself to the hospital. ?Summary ?Gastroesophageal reflux happens when acid from the stomach flows up into the esophagus. GERD is a disease in which the reflux happens often, causes frequent or severe symptoms, or causes problems such as damage to the esophagus. ?Treatment for this condition may vary depending on how severe your symptoms are. Your health care provider may recommend diet and lifestyle changes, medicine, or surgery. ?Contact a health care provider if you have new or worsening symptoms. ?Take over-the-counter and prescription medicines only as told by your health care provider. Do not take aspirin, ibuprofen, or other NSAIDs unless your health care provider told you to do so. ?Keep all follow-up visits as told by your health care provider. This is important. ?This information is not intended to replace advice given to you by your health care provider. Make sure you discuss any questions you have with your health care provider. ?Document Revised: 07/25/2019 Document Reviewed: 07/25/2019 ?Elsevier Patient Education ? 2022 [employer] ? ?

## 2020-10-12 NOTE — Progress Notes (Signed)
Subjective:    Patient ID: Frank Moses, male    DOB: 1965/03/24, 55 y.o.   MRN: [id number]  Chief Complaint  Patient presents with   Medical Management of Chronic Issues   Pt presents to the office today to recheck BP after stopping HCTZ because of gout flare ups and starting Lisinopril 20 mg. He reports since starting he has noticed a dry constant cough and increase GERD symptoms.  He does report his gout has greatly improved since switching.  Hypertension This is a chronic problem. The current episode started more than 1 year ago. The problem has been resolved since onset. Pertinent negatives include no malaise/fatigue, peripheral edema or shortness of breath. Past treatments include ACE inhibitors. The current treatment provides mild improvement.     Review of Systems  Constitutional:  Negative for malaise/fatigue.  Respiratory:  Negative for shortness of breath.   All other systems reviewed and are negative.     Objective:   Physical Exam Vitals reviewed.  Constitutional:      General: He is not in acute distress.    Appearance: He is well-developed.  HENT:     Head: Normocephalic.     Right Ear: Tympanic membrane normal.     Left Ear: Tympanic membrane normal.  Eyes:     General:        Right eye: No discharge.        Left eye: No discharge.     Pupils: Pupils are equal, round, and reactive to light.  Neck:     Thyroid: No thyromegaly.  Cardiovascular:     Rate and Rhythm: Normal rate and regular rhythm.     Heart sounds: Normal heart sounds. No murmur heard. Pulmonary:     Effort: Pulmonary effort is normal. No respiratory distress.     Breath sounds: Normal breath sounds. No wheezing.  Abdominal:     General: Bowel sounds are normal. There is no distension.     Palpations: Abdomen is soft.     Tenderness: There is no abdominal tenderness.  Musculoskeletal:        General: No tenderness. Normal range of motion.     Cervical back: Normal range of motion and  neck supple.  Skin:    General: Skin is warm and dry.     Findings: No erythema or rash.  Neurological:     Mental Status: He is alert and oriented to person, place, and time.     Cranial Nerves: No cranial nerve deficit.     Deep Tendon Reflexes: Reflexes are normal and symmetric.  Psychiatric:        Behavior: Behavior normal.        Thought Content: Thought content normal.        Judgment: Judgment normal.      BP 137/83   Pulse 93   Temp 98 F (36.7 C) (Temporal)   Resp 20   Ht '6\' 2"'$  (1.88 m)   Wt 192 lb (87.1 kg)   SpO2 98%   BMI 24.65 kg/m      Assessment & Plan:  THEOPLIS HAUGER comes in today with chief complaint of Medical Management of Chronic Issues   Diagnosis and orders addressed:  1. Hypertension associated with type 2 diabetes mellitus ([place]) Stop lisinopril 20 mg and start Losartan 50 mg -Dash diet information given -Exercise encouraged - Stress Management  -Continue current meds -RTO in 2 weeks  - losartan (COZAAR) 50 MG tablet; Take 1 tablet (50  mg total) by mouth daily.  Dispense: 90 tablet; Refill: 2  2. Gastroesophageal reflux disease without esophagitis -Diet discussed- Avoid fried, spicy, citrus foods, caffeine and alcohol -Do not eat 2-3 hours before bedtime -Encouraged small frequent meals -Avoid NSAID's  Evelina Dun, FNP

## 2020-10-26 ENCOUNTER — Other Ambulatory Visit: Payer: Self-pay

## 2020-10-26 ENCOUNTER — Ambulatory Visit (INDEPENDENT_AMBULATORY_CARE_PROVIDER_SITE_OTHER): Payer: Medicare Other | Admitting: Family

## 2020-10-26 ENCOUNTER — Encounter: Payer: Self-pay | Admitting: Family

## 2020-10-26 VITALS — BP 149/79 | HR 94 | Temp 97.0°F | Ht 74.0 in | Wt 194.0 lb

## 2020-10-26 DIAGNOSIS — F17210 Nicotine dependence, cigarettes, uncomplicated: Secondary | ICD-10-CM

## 2020-10-26 DIAGNOSIS — I152 Hypertension secondary to endocrine disorders: Secondary | ICD-10-CM | POA: Diagnosis not present

## 2020-10-26 DIAGNOSIS — E1159 Type 2 diabetes mellitus with other circulatory complications: Secondary | ICD-10-CM | POA: Diagnosis not present

## 2020-10-26 MED ORDER — LOSARTAN POTASSIUM 100 MG PO TABS: 100.0000 mg | ORAL_TABLET | Freq: Every day | ORAL | 2 refills | Status: DC

## 2020-10-26 NOTE — Progress Notes (Addendum)
Subjective:    Patient ID: Frank Moses, male    DOB: 11/23/1965, 55 y.o.   MRN: [id number]  Chief Complaint  Patient presents with   Follow-up   Pt presents to the office today to recheck HTN. He was seen on 10/12/20 and we stopped his lisinopril 20 mg and started on losartan 50 mg because of cough and increased GERD. He states these have resolved.  Hypertension This is a chronic problem. The current episode started more than 1 year ago. The problem has been waxing and waning since onset. Pertinent negatives include no malaise/fatigue, peripheral edema or shortness of breath. Past treatments include angiotensin blockers. The current treatment provides moderate improvement.  Nicotine Dependence Presents for follow-up visit. His urge triggers include company of smokers. The symptoms have been stable. He smokes < 1/2 a pack of cigarettes per day.     Review of Systems  Constitutional:  Negative for malaise/fatigue.  Respiratory:  Negative for shortness of breath.   All other systems reviewed and are negative.     Objective:   Physical Exam Vitals reviewed.  Constitutional:      General: He is not in acute distress.    Appearance: He is well-developed.  HENT:     Head: Normocephalic.     Right Ear: Tympanic membrane normal.     Left Ear: Tympanic membrane normal.  Eyes:     General:        Right eye: No discharge.        Left eye: No discharge.     Pupils: Pupils are equal, round, and reactive to light.  Neck:     Thyroid: No thyromegaly.  Cardiovascular:     Rate and Rhythm: Normal rate and regular rhythm.     Heart sounds: Normal heart sounds. No murmur heard. Pulmonary:     Effort: Pulmonary effort is normal. No respiratory distress.     Breath sounds: Normal breath sounds. No wheezing.  Abdominal:     General: Bowel sounds are normal. There is no distension.     Palpations: Abdomen is soft.     Tenderness: There is no abdominal tenderness.  Musculoskeletal:         General: No tenderness. Normal range of motion.     Cervical back: Normal range of motion and neck supple.  Skin:    General: Skin is warm and dry.     Findings: No erythema or rash.  Neurological:     Mental Status: He is alert and oriented to person, place, and time.     Cranial Nerves: No cranial nerve deficit.     Deep Tendon Reflexes: Reflexes are normal and symmetric.  Psychiatric:        Behavior: Behavior normal.        Thought Content: Thought content normal.        Judgment: Judgment normal.       BP (!) 149/79   Pulse 94   Temp (!) 97 F (36.1 C) (Temporal)   Ht 6\' 2"  (1.88 m)   Wt 194 lb (88 kg)   BMI 24.91 kg/m   Assessment & Plan:  JOH RAO comes in today with chief complaint of Follow-up   Diagnosis and orders addressed:  1. Hypertension associated with type 2 diabetes mellitus ([facility]) Will increase losartan to 100 mg from 50 mg  -Dash diet information given -Exercise encouraged - Stress Management  -Continue current meds -RTO in 1 month  - losartan (COZAAR) 100  MG tablet; Take 1 tablet (100 mg total) by mouth daily.  Dispense: 90 tablet; Refill: 2  2. Cigarette nicotine dependence without complication   BMP pending to check kidney function since starting on ARB   Evelina Dun, FNP

## 2020-10-26 NOTE — Patient Instructions (Signed)

## 2020-10-26 NOTE — Addendum Note (Signed)
Addended by: Evelina Dun A on: 10/26/2020 02:10 PM   Modules accepted: Level of Service

## 2020-10-27 LAB — BMP8+EGFR
BUN/Creatinine Ratio: 10 (ref 9–20)
BUN: 8 mg/dL (ref 6–24)
CO2: 22 mmol/L (ref 20–29)
Calcium: 9.7 mg/dL (ref 8.7–10.2)
Chloride: 103 mmol/L (ref 96–106)
Creatinine, Ser: 0.84 mg/dL (ref 0.76–1.27)
Glucose: 172 mg/dL — ABNORMAL HIGH (ref 70–99)
Potassium: 4.6 mmol/L (ref 3.5–5.2)
Sodium: 143 mmol/L (ref 134–144)
eGFR: 103 mL/min/{1.73_m2} (ref >59)

## 2020-10-31 ENCOUNTER — Encounter (HOSPITAL_COMMUNITY): Payer: Self-pay | Admitting: Emergency Medicine

## 2020-10-31 ENCOUNTER — Emergency Department (HOSPITAL_COMMUNITY): Payer: Medicare Other

## 2020-10-31 ENCOUNTER — Emergency Department (HOSPITAL_COMMUNITY)
Admission: EM | Admit: 2020-10-31 | Discharge: 2020-10-31 | Disposition: A | Discharge status: Home / Self Care | Payer: Medicare Other | Attending: Emergency Medicine | Admitting: Emergency Medicine

## 2020-10-31 ENCOUNTER — Other Ambulatory Visit: Payer: Self-pay

## 2020-10-31 DIAGNOSIS — Z7984 Long term (current) use of oral hypoglycemic drugs: Secondary | ICD-10-CM | POA: Diagnosis not present

## 2020-10-31 DIAGNOSIS — R58 Hemorrhage, not elsewhere classified: Secondary | ICD-10-CM | POA: Diagnosis not present

## 2020-10-31 DIAGNOSIS — F10920 Alcohol use, unspecified with intoxication, uncomplicated: Secondary | ICD-10-CM

## 2020-10-31 DIAGNOSIS — Z743 Need for continuous supervision: Secondary | ICD-10-CM | POA: Diagnosis not present

## 2020-10-31 DIAGNOSIS — Y908 Blood alcohol level of 240 mg/100 ml or more: Secondary | ICD-10-CM | POA: Diagnosis not present

## 2020-10-31 DIAGNOSIS — I10 Essential (primary) hypertension: Secondary | ICD-10-CM | POA: Diagnosis not present

## 2020-10-31 DIAGNOSIS — W500XXA Accidental hit or strike by another person, initial encounter: Secondary | ICD-10-CM | POA: Insufficient documentation

## 2020-10-31 DIAGNOSIS — S199XXA Unspecified injury of neck, initial encounter: Secondary | ICD-10-CM | POA: Diagnosis not present

## 2020-10-31 DIAGNOSIS — E1129 Type 2 diabetes mellitus with other diabetic kidney complication: Secondary | ICD-10-CM | POA: Diagnosis not present

## 2020-10-31 DIAGNOSIS — R404 Transient alteration of awareness: Secondary | ICD-10-CM | POA: Diagnosis not present

## 2020-10-31 DIAGNOSIS — F17210 Nicotine dependence, cigarettes, uncomplicated: Secondary | ICD-10-CM | POA: Diagnosis not present

## 2020-10-31 DIAGNOSIS — Y92009 Unspecified place in unspecified non-institutional (private) residence as the place of occurrence of the external cause: Secondary | ICD-10-CM | POA: Diagnosis not present

## 2020-10-31 DIAGNOSIS — S0990XA Unspecified injury of head, initial encounter: Secondary | ICD-10-CM | POA: Diagnosis not present

## 2020-10-31 DIAGNOSIS — Z79899 Other long term (current) drug therapy: Secondary | ICD-10-CM | POA: Insufficient documentation

## 2020-10-31 DIAGNOSIS — F10120 Alcohol abuse with intoxication, uncomplicated: Secondary | ICD-10-CM | POA: Diagnosis not present

## 2020-10-31 DIAGNOSIS — Z043 Encounter for examination and observation following other accident: Secondary | ICD-10-CM | POA: Diagnosis not present

## 2020-10-31 DIAGNOSIS — S0003XA Contusion of scalp, initial encounter: Secondary | ICD-10-CM | POA: Insufficient documentation

## 2020-10-31 DIAGNOSIS — J45909 Unspecified asthma, uncomplicated: Secondary | ICD-10-CM | POA: Insufficient documentation

## 2020-10-31 DIAGNOSIS — T1490XA Injury, unspecified, initial encounter: Secondary | ICD-10-CM

## 2020-10-31 DIAGNOSIS — Z981 Arthrodesis status: Secondary | ICD-10-CM | POA: Diagnosis not present

## 2020-10-31 LAB — COMPREHENSIVE METABOLIC PANEL
ALT: 40 U/L (ref 0–44)
AST: 38 U/L (ref 15–41)
Albumin: 3.7 g/dL (ref 3.5–5.0)
Alkaline Phosphatase: 78 U/L (ref 38–126)
Anion gap: 12 (ref 5–15)
BUN: 8 mg/dL (ref 6–20)
CO2: 21 mmol/L — ABNORMAL LOW (ref 22–32)
Calcium: 8.7 mg/dL — ABNORMAL LOW (ref 8.9–10.3)
Chloride: 103 mmol/L (ref 98–111)
Creatinine, Ser: 0.69 mg/dL (ref 0.61–1.24)
GFR, Estimated: >60 mL/min (ref >60)
Glucose, Bld: 121 mg/dL — ABNORMAL HIGH (ref 70–99)
Potassium: 3.7 mmol/L (ref 3.5–5.1)
Sodium: 136 mmol/L (ref 135–145)
Total Bilirubin: 0.6 mg/dL (ref 0.3–1.2)
Total Protein: 6.8 g/dL (ref 6.5–8.1)

## 2020-10-31 LAB — I-STAT VENOUS BLOOD GAS, ED
Acid-base deficit: 1 mmol/L (ref 0.0–2.0)
Bicarbonate: 23 mmol/L (ref 20.0–28.0)
Calcium, Ion: 1.05 mmol/L — ABNORMAL LOW (ref 1.15–1.40)
HCT: 42 % (ref 39.0–52.0)
Hemoglobin: 14.3 g/dL (ref 13.0–17.0)
O2 Saturation: 98 %
Potassium: 3.7 mmol/L (ref 3.5–5.1)
Sodium: 140 mmol/L (ref 135–145)
TCO2: 24 mmol/L (ref 22–32)
pCO2, Ven: 36 mmHg — ABNORMAL LOW (ref 44.0–60.0)
pH, Ven: 7.413 (ref 7.250–7.430)
pO2, Ven: 98 mmHg — ABNORMAL HIGH (ref 32.0–45.0)

## 2020-10-31 LAB — CBC
HCT: 41.5 % (ref 39.0–52.0)
Hemoglobin: 14.8 g/dL (ref 13.0–17.0)
MCH: 32.8 pg (ref 26.0–34.0)
MCHC: 35.7 g/dL (ref 30.0–36.0)
MCV: 92 fL (ref 80.0–100.0)
Platelets: 212 10*3/uL (ref 150–400)
RBC: 4.51 MIL/uL (ref 4.22–5.81)
RDW: 12.3 % (ref 11.5–15.5)
WBC: 7.6 10*3/uL (ref 4.0–10.5)
nRBC: 0 % (ref 0.0–0.2)

## 2020-10-31 LAB — ETHANOL: Alcohol, Ethyl (B): 248 mg/dL — ABNORMAL HIGH (ref <10)

## 2020-10-31 NOTE — ED Notes (Signed)
Niece Glennon Mac [phone] would like an update

## 2020-10-31 NOTE — Progress Notes (Signed)
Orthopedic Tech Progress Note Patient Details:  [name] [facility] February 07, 1965 [id number]  Patient ID: Frank Moses, male   DOB: [birth year], 55 y.o.   MRN: [id number]  [name] 10/31/2020, 9:00 PM

## 2020-10-31 NOTE — ED Notes (Signed)
Patient transported to CT 

## 2020-10-31 NOTE — ED Provider Notes (Signed)
Emergency Medicine Provider Triage Evaluation Note  MARSHA HILLMAN , a 55 y.o. male  was evaluated in triage.  Pt complains of fall.  He was brought in by [facility] for a fall and is reportedly been drinking.  He missed a step and fell backwards per their report.  He does not take blood thinning medications.  He reports pain in his head and his neck.  He was intermittently combative with EMS per [facility]. Patient does not know his last name, he is unable to answer my questions  Review of Systems  Large hematoma on the left posterior scalp  Physical Exam  BP 120/80 (BP Location: Right Arm)   Pulse 78   Temp 98.2 F (36.8 C) (Oral)   Resp 20   SpO2 96%  Gen:   Patient is somnolent, he awakens to loud voice.  He moves bilateral arms and legs however is unable to answer questions such as what his name is.   Medical Decision Making  Medically screening exam initiated at 8:49 PM.  Appropriate orders placed.  KENDON SEDENO was informed that the remainder of the evaluation will be completed by another provider, this initial triage assessment does not replace that evaluation, and the importance of remaining in the ED until their evaluation is complete.  Note: Portions of this report may have been transcribed using voice recognition software. Every effort was made to ensure accuracy; however, inadvertent computerized transcription errors may be present    Ollen Gross 10/31/20 2051    Valarie Merino, MD 10/31/20 2302

## 2020-10-31 NOTE — ED Notes (Signed)
Trauma Response Nurse Note-  Reason for Call / Reason for Trauma activation:   - Fall with head trauma and decreased GCS  Initial Focused Assessment (If applicable, or please see trauma documentation):  - Pt noted to be alert, with some confusion. Hematoma noted to the posterior left head.   Plan of Care as of this note:  - Waiting on imaging results  Event Summary:   - Pt is back from CT. Pt came in after a fall. Pt came in by EMS and initially went to triage. Primary RN reported that while in triage pt noted to have some confusion so a level 2 trauma was activated. Pt noted to be alert with some confusion, unable to provide specific details of the fall. IV in his left AC noted to have infiltrated and it was removed and a 20G to the right AC was placed. Family came into room prior to [place] going to CT

## 2020-10-31 NOTE — ED Triage Notes (Addendum)
Pt arrives via [facility] for a fall. ETOH+, pt was at home and missed a step, fell backwards, has large hematoma to L posterior head. No blood thinners. C-collar on, pt was intermittently combative w/ EMS. 20g LAC, 84 HR, 122/67, 97% RA, CBG 116
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
## 2020-10-31 NOTE — Discharge Instructions (Addendum)
You had blood test and CT scan done of your brain in the ER today.  Thankfully we do not see any life-threatening emergencies.  There is no brain bleed or broken bones in your skull or upper neck.  You have a hematoma, or collection of blood, on your scalp.  This is a "goose egg."  You need to keep gentle pressure on this wound at home, apply ice packs for 10 minutes at a time.  This often takes a few weeks to resolve completely.  I would recommend that you call to make a follow-up appointment in the next 1 to 2 weeks with the plastic surgeon at the number above.  Sometimes these wounds do need to be opened and the old blood cleaned out.  *  Finally, your blood alcohol level was very high.  I strongly recommend he cut back on your drinking, which can be dangerous.

## 2020-11-01 LAB — SAMPLE TO BLOOD BANK

## 2020-11-01 LAB — RAPID URINE DRUG SCREEN, HOSP PERFORMED
Amphetamines: NOT DETECTED
Barbiturates: NOT DETECTED
Benzodiazepines: NOT DETECTED
Cocaine: NOT DETECTED
Opiates: NOT DETECTED
Tetrahydrocannabinol: NOT DETECTED

## 2020-11-24 ENCOUNTER — Other Ambulatory Visit: Payer: Self-pay | Admitting: Family

## 2020-11-24 DIAGNOSIS — E119 Type 2 diabetes mellitus without complications: Secondary | ICD-10-CM

## 2020-12-18 ENCOUNTER — Other Ambulatory Visit: Payer: Self-pay | Admitting: Family

## 2020-12-18 DIAGNOSIS — K219 Gastro-esophageal reflux disease without esophagitis: Secondary | ICD-10-CM

## 2020-12-26 ENCOUNTER — Telehealth: Payer: Self-pay | Admitting: Family

## 2021-01-07 ENCOUNTER — Other Ambulatory Visit: Payer: Self-pay | Admitting: Family

## 2021-01-07 DIAGNOSIS — I152 Hypertension secondary to endocrine disorders: Secondary | ICD-10-CM

## 2021-01-07 DIAGNOSIS — E1159 Type 2 diabetes mellitus with other circulatory complications: Secondary | ICD-10-CM

## 2021-01-07 DIAGNOSIS — M1A0720 Idiopathic chronic gout, left ankle and foot, without tophus (tophi): Secondary | ICD-10-CM

## 2021-01-15 ENCOUNTER — Ambulatory Visit (INDEPENDENT_AMBULATORY_CARE_PROVIDER_SITE_OTHER): Payer: Medicare Other | Admitting: Family

## 2021-01-15 ENCOUNTER — Encounter: Payer: Self-pay | Admitting: Family

## 2021-01-15 VITALS — BP 134/72 | HR 91 | Temp 97.5°F | Ht 74.0 in | Wt 203.4 lb

## 2021-01-15 DIAGNOSIS — F3341 Major depressive disorder, recurrent, in partial remission: Secondary | ICD-10-CM

## 2021-01-15 DIAGNOSIS — E1159 Type 2 diabetes mellitus with other circulatory complications: Secondary | ICD-10-CM

## 2021-01-15 DIAGNOSIS — F251 Schizoaffective disorder, depressive type: Secondary | ICD-10-CM

## 2021-01-15 DIAGNOSIS — F333 Major depressive disorder, recurrent, severe with psychotic symptoms: Secondary | ICD-10-CM

## 2021-01-15 DIAGNOSIS — E785 Hyperlipidemia, unspecified: Secondary | ICD-10-CM

## 2021-01-15 DIAGNOSIS — I152 Hypertension secondary to endocrine disorders: Secondary | ICD-10-CM

## 2021-01-15 DIAGNOSIS — Z0001 Encounter for general adult medical examination with abnormal findings: Secondary | ICD-10-CM | POA: Diagnosis not present

## 2021-01-15 DIAGNOSIS — F17210 Nicotine dependence, cigarettes, uncomplicated: Secondary | ICD-10-CM

## 2021-01-15 DIAGNOSIS — K219 Gastro-esophageal reflux disease without esophagitis: Secondary | ICD-10-CM

## 2021-01-15 DIAGNOSIS — E1169 Type 2 diabetes mellitus with other specified complication: Secondary | ICD-10-CM | POA: Diagnosis not present

## 2021-01-15 DIAGNOSIS — F1020 Alcohol dependence, uncomplicated: Secondary | ICD-10-CM

## 2021-01-15 DIAGNOSIS — R809 Proteinuria, unspecified: Secondary | ICD-10-CM

## 2021-01-15 DIAGNOSIS — E1129 Type 2 diabetes mellitus with other diabetic kidney complication: Secondary | ICD-10-CM | POA: Diagnosis not present

## 2021-01-15 DIAGNOSIS — Z0000 Encounter for general adult medical examination without abnormal findings: Secondary | ICD-10-CM | POA: Diagnosis not present

## 2021-01-15 LAB — BAYER DCA HB A1C WAIVED: HB A1C (BAYER DCA - WAIVED): 7 % — ABNORMAL HIGH (ref 4.8–5.6)

## 2021-01-15 NOTE — Patient Instructions (Signed)

## 2021-01-15 NOTE — Progress Notes (Signed)
Subjective:    Patient ID: Frank Moses, male    DOB: 07-11-1965, 55 y.o.   MRN: [id number]  Chief Complaint  Patient presents with   Medical Management of Chronic Issues   PT presents to the office today for CPE and chronic follow up. He is followed by [facility] every 6 month for PTSD, depression, and Schizoaffective.    Reports drinking 1-2 beers a day. Has had three DUI's in the past, but trying to get his license back. He reports he is trying to quit.  Hypertension This is a chronic problem. The current episode started more than 1 year ago. The problem has been resolved since onset. The problem is controlled. Pertinent negatives include no blurred vision, malaise/fatigue, peripheral edema or shortness of breath. Risk factors for coronary artery disease include dyslipidemia, diabetes mellitus, obesity and male gender. The current treatment provides moderate improvement.  Gastroesophageal Reflux He complains of belching and heartburn. This is a chronic problem. The current episode started more than 1 year ago. The problem occurs rarely. The problem has been waxing and waning. He has tried a PPI for the symptoms. The treatment provided moderate relief.  Diabetes He presents for his follow-up diabetic visit. He has type 2 diabetes mellitus. There are no hypoglycemic associated symptoms. Pertinent negatives for diabetes include no blurred vision and no foot paresthesias. There are no hypoglycemic complications. Symptoms are stable. Risk factors for coronary artery disease include dyslipidemia, diabetes mellitus, male sex, hypertension and sedentary lifestyle. He is following a generally unhealthy diet. (Does not check BS at home) Eye exam is not current.  Hyperlipidemia This is a chronic problem. The current episode started more than 1 year ago. The problem is controlled. Pertinent negatives include no shortness of breath. Current antihyperlipidemic treatment includes statins. The current  treatment provides moderate improvement of lipids. Risk factors for coronary artery disease include dyslipidemia, diabetes mellitus, male sex, hypertension and a sedentary lifestyle.  Depression        This is a chronic problem.  The current episode started more than 1 year ago.   The onset quality is gradual.   Associated symptoms include irritable, restlessness and sad.  Associated symptoms include no helplessness and no hopelessness.  Past treatments include SNRIs - Serotonin and norepinephrine reuptake inhibitors. Nicotine Dependence Presents for follow-up visit. His urge triggers include company of smokers. The symptoms have been stable. He smokes 1 pack of cigarettes per day.     Review of Systems  Constitutional:  Negative for malaise/fatigue.  Eyes:  Negative for blurred vision.  Respiratory:  Negative for shortness of breath.   Gastrointestinal:  Positive for heartburn.  Psychiatric/Behavioral:  Positive for depression.   All other systems reviewed and are negative.     Family History  Problem Relation Age of Onset   Alcoholism Father    Cancer Father        unknown    Social History   Socioeconomic History   Marital status: Single    Spouse name: Not on file   Number of children: Not on file   Years of education: Not on file   Highest education level: Not on file  Occupational History   Not on file  Tobacco Use   Smoking status: Every Day    Packs/day: 0.50    Types: Cigarettes   Smokeless tobacco: Never  Vaping Use   Vaping Use: Former  Substance and Sexual Activity   Alcohol use: Yes    Comment: 3-6  cans of beer every 3-4 days   Drug use: Never   Sexual activity: Yes    Birth control/protection: None  Other Topics Concern   Not on file  Social History Narrative   ** Merged History Encounter **       Social Determinants of Health   Financial Resource Strain: Not on file  Food Insecurity: Not on file  Transportation Needs: Not on file  Physical  Activity: Not on file  Stress: Not on file  Social Connections: Not on file    Objective:   Physical Exam Vitals reviewed.  Constitutional:      General: He is irritable. He is not in acute distress.    Appearance: He is well-developed.  HENT:     Head: Normocephalic.     Right Ear: Tympanic membrane normal.     Left Ear: Tympanic membrane normal.  Eyes:     General:        Right eye: No discharge.        Left eye: No discharge.     Pupils: Pupils are equal, round, and reactive to light.  Neck:     Thyroid: No thyromegaly.  Cardiovascular:     Rate and Rhythm: Normal rate and regular rhythm.     Heart sounds: Normal heart sounds. No murmur heard. Pulmonary:     Effort: Pulmonary effort is normal. No respiratory distress.     Breath sounds: Normal breath sounds. No wheezing.  Abdominal:     General: Bowel sounds are normal. There is no distension.     Palpations: Abdomen is soft.     Tenderness: There is no abdominal tenderness.  Musculoskeletal:        General: No tenderness. Normal range of motion.     Cervical back: Normal range of motion and neck supple.  Skin:    General: Skin is warm and dry.     Findings: No erythema or rash.  Neurological:     Mental Status: He is alert and oriented to person, place, and time.     Cranial Nerves: No cranial nerve deficit.     Deep Tendon Reflexes: Reflexes are normal and symmetric.  Psychiatric:        Behavior: Behavior normal.        Thought Content: Thought content normal.        Judgment: Judgment normal.      BP 134/72    Pulse 91    Temp (!) 97.5 F (36.4 C) (Temporal)    Ht 6' 2"  (1.88 m)    Wt 203 lb 6.4 oz (92.3 kg)    BMI 26.12 kg/m      Assessment & Plan:  Frank Moses comes in today with chief complaint of Medical Management of Chronic Issues   Diagnosis and orders addressed:  1. Hypertension associated with type 2 diabetes mellitus ([place]) - CBC with Differential/Platelet - Hepatic function panel -  BMP8+EGFR  2. Gastroesophageal reflux disease without esophagitis - CBC with Differential/Platelet - Hepatic function panel - BMP8+EGFR  3. Type 2 diabetes mellitus with other specified complication, without long-term current use of insulin (HCC) - CBC with Differential/Platelet - Hepatic function panel - BMP8+EGFR - Bayer DCA Hb A1c Waived - Ambulatory referral to Podiatry  4. Microalbuminuria due to type 2 diabetes mellitus (HCC) - CBC with Differential/Platelet - Hepatic function panel - BMP8+EGFR  5. Hyperlipidemia associated with type 2 diabetes mellitus ([place]) - CBC with Differential/Platelet - Lipid panel - Hepatic function panel -  BMP8+EGFR  6. Alcohol use disorder, severe, dependence (HCC) - CBC with Differential/Platelet - Hepatic function panel - BMP8+EGFR  7. Schizoaffective disorder, depressive type (HCC)  - CBC with Differential/Platelet - Hepatic function panel - BMP8+EGFR  8. Recurrent major depressive disorder, in partial remission (HCC) - CBC with Differential/Platelet - Hepatic function panel - BMP8+EGFR  9. Cigarette nicotine dependence without complication - CBC with Differential/Platelet - Hepatic function panel - BMP8+EGFR  10. MDD (major depressive disorder), recurrent, severe, with psychosis ([place]) - CBC with Differential/Platelet - Hepatic function panel - BMP8+EGFR  11. Annual physical exam  - CBC with Differential/Platelet - Lipid panel - PSA, total and free - TSH - Hepatic function panel - BMP8+EGFR - Bayer DCA Hb A1c Waived   Labs pending Health Maintenance reviewed Diet and exercise encouraged  Follow up plan: 6 months   Evelina Dun, FNP

## 2021-01-16 LAB — CBC WITH DIFFERENTIAL/PLATELET
Basophils Absolute: 0.1 10*3/uL (ref 0.0–0.2)
Basos: 1 %
EOS (ABSOLUTE): 0.4 10*3/uL (ref 0.0–0.4)
Eos: 5 %
Hematocrit: 41.4 % (ref 37.5–51.0)
Hemoglobin: 14.2 g/dL (ref 13.0–17.7)
Immature Grans (Abs): 0 10*3/uL (ref 0.0–0.1)
Immature Granulocytes: 0 %
Lymphocytes Absolute: 2.2 10*3/uL (ref 0.7–3.1)
Lymphs: 31 %
MCH: 32.2 pg (ref 26.6–33.0)
MCHC: 34.3 g/dL (ref 31.5–35.7)
MCV: 94 fL (ref 79–97)
Monocytes Absolute: 0.6 10*3/uL (ref 0.1–0.9)
Monocytes: 8 %
Neutrophils Absolute: 4 10*3/uL (ref 1.4–7.0)
Neutrophils: 55 %
Platelets: 226 10*3/uL (ref 150–450)
RBC: 4.41 x10E6/uL (ref 4.14–5.80)
RDW: 12.1 % (ref 11.6–15.4)
WBC: 7.2 10*3/uL (ref 3.4–10.8)

## 2021-01-16 LAB — PSA, TOTAL AND FREE
PSA, Free Pct: 35 %
PSA, Free: 0.14 ng/mL
Prostate Specific Ag, Serum: 0.4 ng/mL (ref 0.0–4.0)

## 2021-01-16 LAB — HEPATIC FUNCTION PANEL
ALT: 30 IU/L (ref 0–44)
AST: 23 IU/L (ref 0–40)
Albumin: 4.3 g/dL (ref 3.8–4.9)
Alkaline Phosphatase: 105 IU/L (ref 44–121)
Bilirubin Total: 0.2 mg/dL (ref 0.0–1.2)
Bilirubin, Direct: <0.1 mg/dL (ref 0.00–0.40)
Total Protein: 6.6 g/dL (ref 6.0–8.5)

## 2021-01-16 LAB — LIPID PANEL
Chol/HDL Ratio: 5.8 ratio — ABNORMAL HIGH (ref 0.0–5.0)
Cholesterol, Total: 157 mg/dL (ref 100–199)
HDL: 27 mg/dL — ABNORMAL LOW (ref >39)
LDL Chol Calc (NIH): 83 mg/dL (ref 0–99)
Triglycerides: 287 mg/dL — ABNORMAL HIGH (ref 0–149)
VLDL Cholesterol Cal: 47 mg/dL — ABNORMAL HIGH (ref 5–40)

## 2021-01-16 LAB — TSH: TSH: 0.693 u[IU]/mL (ref 0.450–4.500)

## 2021-01-16 LAB — BMP8+EGFR
BUN/Creatinine Ratio: 12 (ref 9–20)
BUN: 10 mg/dL (ref 6–24)
CO2: 22 mmol/L (ref 20–29)
Calcium: 9 mg/dL (ref 8.7–10.2)
Chloride: 106 mmol/L (ref 96–106)
Creatinine, Ser: 0.84 mg/dL (ref 0.76–1.27)
Glucose: 303 mg/dL — ABNORMAL HIGH (ref 70–99)
Potassium: 4.7 mmol/L (ref 3.5–5.2)
Sodium: 141 mmol/L (ref 134–144)
eGFR: 103 mL/min/{1.73_m2} (ref >59)

## 2021-02-04 ENCOUNTER — Ambulatory Visit (INDEPENDENT_AMBULATORY_CARE_PROVIDER_SITE_OTHER): Payer: Commercial Managed Care - HMO

## 2021-02-04 VITALS — Ht 74.0 in | Wt 203.0 lb

## 2021-02-04 DIAGNOSIS — I152 Hypertension secondary to endocrine disorders: Secondary | ICD-10-CM

## 2021-02-04 DIAGNOSIS — E1169 Type 2 diabetes mellitus with other specified complication: Secondary | ICD-10-CM

## 2021-02-04 DIAGNOSIS — Z0000 Encounter for general adult medical examination without abnormal findings: Secondary | ICD-10-CM

## 2021-02-04 DIAGNOSIS — E1159 Type 2 diabetes mellitus with other circulatory complications: Secondary | ICD-10-CM

## 2021-02-04 DIAGNOSIS — Z0001 Encounter for general adult medical examination with abnormal findings: Secondary | ICD-10-CM

## 2021-02-04 NOTE — Progress Notes (Signed)
Subjective:   Frank Moses is a 56 y.o. male who presents for an Initial Medicare Annual Wellness Visit. Virtual Visit via Telephone Note  I connected with  Frank Moses on 02/04/21 at  8:15 AM EST by telephone and verified that I am speaking with the correct person using two identifiers.  Location: Patient: HOME Provider: [facility]. Persons participating in the virtual visit: patient/Nurse Health Advisor   I discussed the limitations, risks, security and privacy concerns of performing an evaluation and management service by telephone and the availability of in person appointments. The patient expressed understanding and agreed to proceed.  Interactive audio and video telecommunications were attempted between this nurse and patient, however failed, due to patient having technical difficulties OR patient did not have access to video capability.  We continued and completed visit with audio only.  Some vital signs may be absent or patient reported.   Chriss Driver, LPN  Review of Systems     Cardiac Risk Factors include: advanced age (>38mn, >>50women);diabetes mellitus;hypertension;dyslipidemia;male gender;smoking/ tobacco exposure  PHONE VISIT. PT AT HOME. [facility]   Objective:    Today's Vitals   02/04/21 0817  Weight: 203 lb (92.1 kg)  Height: 6' 2"  (1.88 m)   Body mass index is 26.06 kg/m.  Advanced Directives 02/04/2021 12/01/2018 07/02/2018 09/25/2016 09/25/2016 09/22/2016 09/22/2016  Does Patient Have a Medical Advance Directive? No No No No No No No  Would patient like information on creating a medical advance directive? No - Patient declined Yes (MAU/Ambulatory/Procedural Areas - Information given) - No - Patient declined - No - Patient declined No - Patient declined  Some encounter information is confidential and restricted. Go to Review Flowsheets activity to see all data.    Current Medications (verified) Outpatient Encounter Medications as of 02/04/2021   Medication Sig   allopurinol (ZYLOPRIM) 300 MG tablet Take 1 tablet (300 mg total) by mouth daily.   amLODipine (NORVASC) 10 MG tablet Take 1 tablet (10 mg total) by mouth daily.   atorvastatin (LIPITOR) 20 MG tablet Take 1 tablet (20 mg total) by mouth daily.   baclofen (LIORESAL) 10 MG tablet Take 1 tablet (10 mg total) by mouth 3 (three) times daily.   blood glucose meter kit and supplies Dispense based on patient and insurance preference. Use up to four times daily as directed. (FOR ICD-10 E10.9, E11.9).   diclofenac (VOLTAREN) 75 MG EC tablet Take 1 tablet (75 mg total) by mouth 2 (two) times daily.   losartan (COZAAR) 100 MG tablet Take 1 tablet (100 mg total) by mouth daily.   metFORMIN (GLUCOPHAGE) 500 MG tablet Take 1 tablet (500 mg total) by mouth daily with breakfast.   pantoprazole (PROTONIX) 40 MG tablet Take 1 tablet (40 mg total) by mouth daily. For acid reflux   traZODone (DESYREL) 100 MG tablet Take 1 tablet (100 mg total) by mouth at bedtime.   venlafaxine XR (EFFEXOR-XR) 75 MG 24 hr capsule Take 3 capsules (225 mg total) by mouth daily with breakfast.   No facility-administered encounter medications on file as of 02/04/2021.    Allergies (verified) Olanzapine, Orange oil, Other, Shrimp [shellfish allergy], Tomato, Vicodin [hydrocodone-acetaminophen], Orange fruit [citrus], and Tomato   History: Past Medical History:  Diagnosis Date   Arthritis    Asthma    Bipolar 1 disorder ([place]    Bipolar affective ([facility]    Depression    Gout    Hyperlipidemia    Hypertension  Peptic ulcer    Schizophrenia ([place])    Stroke [facility])    Past Surgical History:  Procedure Laterality Date   bil foot surgery     CERVICAL FUSION     COLONOSCOPY N/A 12/01/2018   Procedure: COLONOSCOPY;  Surgeon: Rogene Houston, MD;  Location: [facility];  Service: Endoscopy;  Laterality: N/A;  830   FOOT SURGERY     HIP SURGERY Left    NECK SURGERY     cyst removed from neck    Family  History  Problem Relation Age of Onset   Alcoholism Father    Cancer Father        unknown    Social History   Socioeconomic History   Marital status: Single    Spouse name: Not on file   Number of children: 2   Years of education: Not on file   Highest education level: Not on file  Occupational History   Not on file  Tobacco Use   Smoking status: Every Day    Packs/day: 0.50    Types: Cigarettes   Smokeless tobacco: Never  Vaping Use   Vaping Use: Former  Substance and Sexual Activity   Alcohol use: Yes    Comment: 3-6 cans of beer every 3-4 days   Drug use: Never   Sexual activity: Yes    Birth control/protection: None  Other Topics Concern   Not on file  Social History Narrative   ** Merged History Encounter **    Single.   2 daughters.   4 grandchildren, ages 60-10 in 2023.   Social Determinants of Health   Financial Resource Strain: Low Risk    Difficulty of Paying Living Expenses: Not hard at all  Food Insecurity: No Food Insecurity   Worried About Charity fundraiser in the Last Year: Never true   [place] in the Last Year: Never true  Transportation Needs: No Transportation Needs   Lack of Transportation (Medical): No   Lack of Transportation (Non-Medical): No  Physical Activity: Sufficiently Active   Days of Exercise per Week: 5 days   Minutes of Exercise per Session: 30 min  Stress: No Stress Concern Present   Feeling of Stress : Not at all  Social Connections: Moderately Integrated   Frequency of Communication with Friends and Family: More than three times a week   Frequency of Social Gatherings with Friends and Family: More than three times a week   Attends Religious Services: 1 to 4 times per year   Active Member of [employer] or Organizations: Yes   Attends Archivist Meetings: 1 to 4 times per year   Marital Status: Never married    Tobacco Counseling Ready to quit: Not Answered Counseling given: Not Answered   Clinical  Intake:  Pre-visit preparation completed: Yes  Pain : No/denies pain     BMI - recorded: 26.06 Nutritional Risks: None Diabetes: Yes  How often do you need to have someone help you when you read instructions, pamphlets, or other written materials from your doctor or pharmacy?: 1 - Never  Diabetic?Nutrition Risk Assessment:  Has the patient had any N/V/D within the last 2 months?  No  Does the patient have any non-healing wounds?  No  Has the patient had any unintentional weight loss or weight gain?  No   Diabetes:  Is the patient diabetic?  Yes  If diabetic, was a CBG obtained today?  No  Did the patient bring  in their glucometer from home?  No  How often do you monitor your CBG's? Pt doesn't check regularly.   Financial Strains and Diabetes Management:  Are you having any financial strains with the device, your supplies or your medication? No .  Does the patient want to be seen by Chronic Care Management for management of their diabetes?  No  Would the patient like to be referred to a Nutritionist or for Diabetic Management?  No   Diabetic Exams:  Diabetic Eye Exam: Completed 06/06/2019. Overdue for diabetic eye exam. Pt has been advised about the importance in completing this exam. A referral has been placed today. Message sent to referral coordinator for scheduling purposes. Advised pt to expect a call from office referred to regarding appt.  Diabetic Foot Exam: Completed 01/15/2021. Pt has been advised about the importance in completing this exam.   Interpreter Needed?: No  Information entered by :: MJ Shyanna Klingel, LPN   Activities of Daily Living In your present state of health, do you have any difficulty performing the following activities: 02/04/2021  Hearing? N  Vision? N  Difficulty concentrating or making decisions? N  Walking or climbing stairs? N  Dressing or bathing? N  Doing errands, shopping? N  Preparing Food and eating ? N  Using the Toilet? N  In the  past six months, have you accidently leaked urine? N  Do you have problems with loss of bowel control? N  Managing your Medications? N  Managing your Finances? N  Housekeeping or managing your Housekeeping? N  Some recent data might be hidden    Patient Care Team: Sharion Balloon, FNP as PCP - General (Family Medicine) Dione Housekeeper, MD (Family Medicine) Celestia Khat, OD (Optometry)  Indicate any recent Medical Services you may have received from other than [facility] providers in the past year (date may be approximate).     Assessment:   This is a routine wellness examination for Frank Moses.  Hearing/Vision screen Hearing Screening - Comments:: Some hearing issues, R ear. Vision Screening - Comments:: Glasses. [facility] 2022.  Dietary issues and exercise activities discussed: Current Exercise Habits: Home exercise routine, Type of exercise: walking, Time (Minutes): 45, Frequency (Times/Week): 5, Weekly Exercise (Minutes/Week): 225, Intensity: Mild, Exercise limited by: cardiac condition(s);psychological condition(s)   Goals Addressed             This Visit's Progress    Exercise 3x per week (30 min per time)       Pt states he would like to exercise more.     Quit Smoking       Pt states he would like to quit smoking in 2023.       Depression Screen PHQ 2/9 Scores 02/04/2021 10/26/2020 10/12/2020 09/27/2020 05/01/2020 04/17/2020 11/29/2019  PHQ - 2 Score 0 2 2 2  0 6 1  PHQ- 9 Score - 15 15 12  - 15 16    Fall Risk Fall Risk  02/04/2021 09/27/2020 05/01/2020 04/17/2020 07/04/2019  Falls in the past year? 1 0 0 0 0  Number falls in past yr: 0 - - - -  Injury with Fall? 0 - - - -  Comment Cut head - - - -  Risk for fall due to : Impaired balance/gait;History of fall(s) - - - -  Follow up Falls prevention discussed - - - -    FALL RISK PREVENTION PERTAINING TO THE HOME:  Any stairs in or around the home? Yes  If so, are there any  without handrails? No  Home free of loose throw rugs  in walkways, pet beds, electrical cords, etc? Yes  Adequate lighting in your home to reduce risk of falls? Yes   ASSISTIVE DEVICES UTILIZED TO PREVENT FALLS:  Life alert? No  Use of a cane, walker or w/c? No  Grab bars in the bathroom? Yes  Shower chair or bench in shower? No  Elevated toilet seat or a handicapped toilet? No   TIMED UP AND GO:  Was the test performed? No .  Phone visit.  Cognitive Function:     6CIT Screen 02/04/2021  What Year? 0 points  What month? 0 points  What time? 0 points  Count back from 20 0 points  Months in reverse 0 points  Repeat phrase 0 points  Total Score 0    Immunizations Immunization History  Administered Date(s) Administered   Influenza,inj,Quad PF,6+ Mos 02/16/2014, 01/08/2015   Influenza-Unspecified 02/16/2014, 01/08/2015, 01/08/2021   Moderna Sars-Covid-2 Vaccination 06/13/2019, 07/09/2019, 01/19/2020   Pneumococcal Polysaccharide-23 02/16/2014   Tdap 07/02/2018   Zoster Recombinat (Shingrix) 01/08/2021    TDAP status: Up to date  Flu Vaccine status: Up to date  Pneumococcal vaccine status: Up to date  Covid-19 vaccine status: Completed vaccines  Qualifies for Shingles Vaccine? Yes   Zostavax completed  First Shingrix given 01/08/2021.   Shingrix Completed?: No.    Education has been provided regarding the importance of this vaccine. Patient has been advised to call insurance company to determine out of pocket expense if they have not yet received this vaccine. Advised may also receive vaccine at local pharmacy or Health Dept. Verbalized acceptance and understanding.  Screening Tests Health Maintenance  Topic Date Due   COVID-19 Vaccine (4 - Booster for Moderna series) 03/15/2020   OPHTHALMOLOGY EXAM  06/05/2020   Pneumococcal Vaccine 31-23 Years old (2 - PCV) 09/27/2021 (Originally 02/17/2015)   Zoster Vaccines- Shingrix (2 of 2) 03/05/2021   HEMOGLOBIN A1C  07/16/2021   FOOT EXAM  01/15/2022   TETANUS/TDAP   07/01/2028   COLONOSCOPY (Pts 45-14yr Insurance coverage will need to be confirmed)  11/30/2028   INFLUENZA VACCINE  Completed   Hepatitis C Screening  Completed   HIV Screening  Completed   HPV VACCINES  Aged Out    Health Maintenance  Health Maintenance Due  Topic Date Due   COVID-19 Vaccine (4 - Booster for Moderna series) 03/15/2020   OPHTHALMOLOGY EXAM  06/05/2020    Colorectal cancer screening: Type of screening: Colonoscopy. Completed 12/01/2018. Repeat every 10 years  Lung Cancer Screening: (Low Dose CT Chest recommended if Age 56-80years, 30 pack-year currently smoking OR have quit w/in 15years.) does qualify.   Lung Cancer Screening Referral: Pt declined at this time.  Additional Screening:  Hepatitis C Screening: does not qualify; Completed 11/29/2019  Vision Screening: Recommended annual ophthalmology exams for early detection of glaucoma and other disorders of the eye. Is the patient up to date with their annual eye exam?  No  Who is the provider or what is the name of the office in which the patient attends annual eye exams? [facility] If pt is not established with a provider, would they like to be referred to a provider to establish care? Yes .   Dental Screening: Recommended annual dental exams for proper oral hygiene  Community Resource Referral / Chronic Care Management: CRR required this visit?  No   CCM required this visit?  No      Plan:  I have personally reviewed and noted the following in the patients chart:   Medical and social history Use of alcohol, tobacco or illicit drugs  Current medications and supplements including opioid prescriptions. Patient is not currently taking opioid prescriptions. Functional ability and status Nutritional status Physical activity Advanced directives List of other physicians Hospitalizations, surgeries, and ER visits in previous 12 months Vitals Screenings to include cognitive, depression, and  falls Referrals and appointments  In addition, I have reviewed and discussed with patient certain preventive protocols, quality metrics, and best practice recommendations. A written personalized care plan for preventive services as well as general preventive health recommendations were provided to patient.     Chriss Driver, LPN   09/29/4266   Nurse Notes: Pt over due for eye exam. Pt advised and would like referral to [facility] in [place]. Referral order placed. Up to date on all other health maintenance and vaccines. Declined lung cancer screening at this time. 6CIT score of 0.

## 2021-02-04 NOTE — Patient Instructions (Signed)
Mr. Labreck , Thank you for taking time to come for your Medicare Wellness Visit. I appreciate your ongoing commitment to your health goals. Please review the following plan we discussed and let me know if I can assist you in the future.   Screening recommendations/referrals: Colonoscopy: Done 12/01/2018 Repeat in 10 years  Recommended yearly ophthalmology/optometry visit for glaucoma screening and checkup Recommended yearly dental visit for hygiene and checkup  Vaccinations: Influenza vaccine: Done 01/08/2021 Repeat annually  Pneumococcal vaccine: Done 02/16/2014 Tdap vaccine: Done 07/02/2018 Repeat in 10 years  Shingles vaccine: Done 01/08/2021. Second dose due 02/09/2021.   Covid-19: Done 06/13/2019, 07/09/2019 and 01/19/2020.  Advanced directives: Advance directive discussed with you today. Even though you declined this today, please call our office should you change your mind, and we can give you the proper paperwork for you to fill out.   Conditions/risks identified: Aim for 30 minutes of exercise or brisk walking each day, drink 6-8 glasses of water and eat lots of fruits and vegetables. If you wish to quit smoking, help is available. For free tobacco cessation program offerings call the [facility] at 9365476441 or Live Well Line at 3127158115. You may also visit www.[place].com or email livelifewell@[place] .com for more information on other programs.   You may also call 1-800-QUIT-NOW 281-438-2181) or visit www.VirusCrisis.dk or www.BecomeAnEx.org for additional resources on smoking cessation.    Next appointment: Follow up in one year for your annual wellness visit 2024.  Preventive Care 40-64 Years, Male Preventive care refers to lifestyle choices and visits with your health care provider that can promote health and wellness. What does preventive care include? A yearly physical exam. This is also called an annual well check. Dental exams once or twice  a year. Routine eye exams. Ask your health care provider how often you should have your eyes checked. Personal lifestyle choices, including: Daily care of your teeth and gums. Regular physical activity. Eating a healthy diet. Avoiding tobacco and drug use. Limiting alcohol use. Practicing safe sex. Taking low-dose aspirin every day starting at age 38. What happens during an annual well check? The services and screenings done by your health care provider during your annual well check will depend on your age, overall health, lifestyle risk factors, and family history of disease. Counseling  Your health care provider may ask you questions about your: Alcohol use. Tobacco use. Drug use. Emotional well-being. Home and relationship well-being. Sexual activity. Eating habits. Work and work Statistician. Screening  You may have the following tests or measurements: Height, weight, and BMI. Blood pressure. Lipid and cholesterol levels. These may be checked every 5 years, or more frequently if you are over 28 years old. Skin check. Lung cancer screening. You may have this screening every year starting at age 69 if you have a 30-pack-year history of smoking and currently smoke or have quit within the past 15 years. Fecal occult blood test (FOBT) of the stool. You may have this test every year starting at age 76. Flexible sigmoidoscopy or colonoscopy. You may have a sigmoidoscopy every 5 years or a colonoscopy every 10 years starting at age 29. Prostate cancer screening. Recommendations will vary depending on your family history and other risks. Hepatitis C blood test. Hepatitis B blood test. Sexually transmitted disease (STD) testing. Diabetes screening. This is done by checking your blood sugar (glucose) after you have not eaten for a while (fasting). You may have this done every 1-3 years. Discuss your test results, treatment  options, and if necessary, the need for more tests with your  health care provider. Vaccines  Your health care provider may recommend certain vaccines, such as: Influenza vaccine. This is recommended every year. Tetanus, diphtheria, and acellular pertussis (Tdap, Td) vaccine. You may need a Td booster every 10 years. Zoster vaccine. You may need this after age 45. Pneumococcal 13-valent conjugate (PCV13) vaccine. You may need this if you have certain conditions and have not been vaccinated. Pneumococcal polysaccharide (PPSV23) vaccine. You may need one or two doses if you smoke cigarettes or if you have certain conditions. Talk to your health care provider about which screenings and vaccines you need and how often you need them. This information is not intended to replace advice given to you by your health care provider. Make sure you discuss any questions you have with your health care provider. Document Released: 02/09/2015 Document Revised: 10/03/2015 Document Reviewed: 11/14/2014 Elsevier Interactive Patient Education  2017 [place] Prevention in the Home Falls can cause injuries. They can happen to people of all ages. There are many things you can do to make your home safe and to help prevent falls. What can I do on the outside of my home? Regularly fix the edges of walkways and driveways and fix any cracks. Remove anything that might make you trip as you walk through a door, such as a raised step or threshold. Trim any bushes or trees on the path to your home. Use bright outdoor lighting. Clear any walking paths of anything that might make someone trip, such as rocks or tools. Regularly check to see if handrails are loose or broken. Make sure that both sides of any steps have handrails. Any raised decks and porches should have guardrails on the edges. Have any leaves, snow, or ice cleared regularly. Use sand or salt on walking paths during winter. Clean up any spills in your garage right away. This includes oil or grease spills. What  can I do in the bathroom? Use night lights. Install grab bars by the toilet and in the tub and shower. Do not use towel bars as grab bars. Use non-skid mats or decals in the tub or shower. If you need to sit down in the shower, use a plastic, non-slip stool. Keep the floor dry. Clean up any water that spills on the floor as soon as it happens. Remove soap buildup in the tub or shower regularly. Attach bath mats securely with double-sided non-slip rug tape. Do not have throw rugs and other things on the floor that can make you trip. What can I do in the bedroom? Use night lights. Make sure that you have a light by your bed that is easy to reach. Do not use any sheets or blankets that are too big for your bed. They should not hang down onto the floor. Have a firm chair that has side arms. You can use this for support while you get dressed. Do not have throw rugs and other things on the floor that can make you trip. What can I do in the kitchen? Clean up any spills right away. Avoid walking on wet floors. Keep items that you use a lot in easy-to-reach places. If you need to reach something above you, use a strong step stool that has a grab bar. Keep electrical cords out of the way. Do not use floor polish or wax that makes floors slippery. If you must use wax, use non-skid floor wax. Do not have  throw rugs and other things on the floor that can make you trip. What can I do with my stairs? Do not leave any items on the stairs. Make sure that there are handrails on both sides of the stairs and use them. Fix handrails that are broken or loose. Make sure that handrails are as long as the stairways. Check any carpeting to make sure that it is firmly attached to the stairs. Fix any carpet that is loose or worn. Avoid having throw rugs at the top or bottom of the stairs. If you do have throw rugs, attach them to the floor with carpet tape. Make sure that you have a light switch at the top of the  stairs and the bottom of the stairs. If you do not have them, ask someone to add them for you. What else can I do to help prevent falls? Wear shoes that: Do not have high heels. Have rubber bottoms. Are comfortable and fit you well. Are closed at the toe. Do not wear sandals. If you use a stepladder: Make sure that it is fully opened. Do not climb a closed stepladder. Make sure that both sides of the stepladder are locked into place. Ask someone to hold it for you, if possible. Clearly mark and make sure that you can see: Any grab bars or handrails. First and last steps. Where the edge of each step is. Use tools that help you move around (mobility aids) if they are needed. These include: Canes. Walkers. Scooters. Crutches. Turn on the lights when you go into a dark area. Replace any light bulbs as soon as they burn out. Set up your furniture so you have a clear path. Avoid moving your furniture around. If any of your floors are uneven, fix them. If there are any pets around you, be aware of where they are. Review your medicines with your doctor. Some medicines can make you feel dizzy. This can increase your chance of falling. Ask your doctor what other things that you can do to help prevent falls. This information is not intended to replace advice given to you by your health care provider. Make sure you discuss any questions you have with your health care provider. Document Released: 11/09/2008 Document Revised: 06/21/2015 Document Reviewed: 02/17/2014 Elsevier Interactive Patient Education  2017 [employer].

## 2021-02-25 ENCOUNTER — Other Ambulatory Visit: Payer: Self-pay | Admitting: Family

## 2021-02-25 DIAGNOSIS — E119 Type 2 diabetes mellitus without complications: Secondary | ICD-10-CM

## 2021-03-05 ENCOUNTER — Other Ambulatory Visit: Payer: Self-pay | Admitting: Family

## 2021-03-05 DIAGNOSIS — E1159 Type 2 diabetes mellitus with other circulatory complications: Secondary | ICD-10-CM

## 2021-03-05 DIAGNOSIS — I152 Hypertension secondary to endocrine disorders: Secondary | ICD-10-CM

## 2021-03-19 ENCOUNTER — Other Ambulatory Visit: Payer: Self-pay | Admitting: Family

## 2021-03-19 DIAGNOSIS — K219 Gastro-esophageal reflux disease without esophagitis: Secondary | ICD-10-CM

## 2021-04-08 ENCOUNTER — Other Ambulatory Visit: Payer: Self-pay | Admitting: Family

## 2021-04-08 DIAGNOSIS — M1A0720 Idiopathic chronic gout, left ankle and foot, without tophus (tophi): Secondary | ICD-10-CM

## 2021-04-15 ENCOUNTER — Ambulatory Visit: Payer: Medicare Other | Admitting: Family

## 2021-04-16 ENCOUNTER — Encounter: Payer: Self-pay | Admitting: Family

## 2021-04-26 ENCOUNTER — Ambulatory Visit (INDEPENDENT_AMBULATORY_CARE_PROVIDER_SITE_OTHER): Payer: Medicare Other | Admitting: Family

## 2021-04-26 ENCOUNTER — Encounter: Payer: Self-pay | Admitting: Family

## 2021-04-26 VITALS — BP 156/78 | HR 64 | Temp 97.8°F | Ht 74.0 in | Wt 200.6 lb

## 2021-04-26 DIAGNOSIS — K219 Gastro-esophageal reflux disease without esophagitis: Secondary | ICD-10-CM | POA: Diagnosis not present

## 2021-04-26 DIAGNOSIS — F1020 Alcohol dependence, uncomplicated: Secondary | ICD-10-CM

## 2021-04-26 DIAGNOSIS — E1169 Type 2 diabetes mellitus with other specified complication: Secondary | ICD-10-CM

## 2021-04-26 DIAGNOSIS — E1159 Type 2 diabetes mellitus with other circulatory complications: Secondary | ICD-10-CM | POA: Diagnosis not present

## 2021-04-26 DIAGNOSIS — F333 Major depressive disorder, recurrent, severe with psychotic symptoms: Secondary | ICD-10-CM

## 2021-04-26 DIAGNOSIS — F17210 Nicotine dependence, cigarettes, uncomplicated: Secondary | ICD-10-CM | POA: Diagnosis not present

## 2021-04-26 DIAGNOSIS — I152 Hypertension secondary to endocrine disorders: Secondary | ICD-10-CM

## 2021-04-26 DIAGNOSIS — R809 Proteinuria, unspecified: Secondary | ICD-10-CM

## 2021-04-26 DIAGNOSIS — E119 Type 2 diabetes mellitus without complications: Secondary | ICD-10-CM | POA: Diagnosis not present

## 2021-04-26 DIAGNOSIS — E1129 Type 2 diabetes mellitus with other diabetic kidney complication: Secondary | ICD-10-CM | POA: Diagnosis not present

## 2021-04-26 DIAGNOSIS — F3341 Major depressive disorder, recurrent, in partial remission: Secondary | ICD-10-CM

## 2021-04-26 DIAGNOSIS — E785 Hyperlipidemia, unspecified: Secondary | ICD-10-CM

## 2021-04-26 DIAGNOSIS — F251 Schizoaffective disorder, depressive type: Secondary | ICD-10-CM

## 2021-04-26 LAB — BAYER DCA HB A1C WAIVED: HB A1C (BAYER DCA - WAIVED): 7.5 % — ABNORMAL HIGH (ref 4.8–5.6)

## 2021-04-26 NOTE — Progress Notes (Signed)
? ?Subjective:  ? ? Patient ID: Frank Moses, male    DOB: 30-Jun-1965, 56 y.o.   MRN: [id number] ? ?Chief Complaint  ?Patient presents with  ? Medical Management of Chronic Issues  ? ?PT presents to the office today for CPE and chronic follow up. He is followed by [facility] every 6 month for PTSD, depression, and Schizoaffective.  ?  ?Reports drinking 1-2 beers a day. He is trying to quit.  Has had three DUI's in the past, but trying to get his license back. He reports he is trying to quit.  ?Hypertension ?This is a chronic problem. The current episode started more than 1 year ago. The problem has been resolved since onset. The problem is controlled. Associated symptoms include malaise/fatigue and shortness of breath. Pertinent negatives include no blurred vision or peripheral edema. Risk factors for coronary artery disease include dyslipidemia, obesity, diabetes mellitus, male gender and sedentary lifestyle. The current treatment provides moderate improvement.  ?Diabetes ?He presents for his follow-up diabetic visit. He has type 2 diabetes mellitus. Associated symptoms include fatigue. Pertinent negatives for diabetes include no blurred vision and no foot paresthesias. Symptoms are stable. Pertinent negatives for diabetic complications include no heart disease or peripheral neuropathy. Risk factors for coronary artery disease include dyslipidemia, diabetes mellitus, male sex, hypertension and sedentary lifestyle. He is following a generally healthy diet. (Does not check BS at home) Eye exam is not current.  ?Gastroesophageal Reflux ?He complains of belching and heartburn. This is a chronic problem. The current episode started more than 1 year ago. The problem occurs occasionally. Associated symptoms include fatigue. He has tried a PPI for the symptoms. The treatment provided moderate relief.  ?Hyperlipidemia ?This is a chronic problem. The current episode started more than 1 year ago. The problem is  controlled. Recent lipid tests were reviewed and are normal. Associated symptoms include shortness of breath. The current treatment provides moderate improvement of lipids. Risk factors for coronary artery disease include diabetes mellitus, dyslipidemia, male sex, hypertension and a sedentary lifestyle.  ?Depression ?       This is a chronic problem.  The current episode started more than 1 year ago.   Associated symptoms include fatigue, helplessness, hopelessness, irritable and restlessness.  Associated symptoms include not sad.  Past treatments include SNRIs - Serotonin and norepinephrine reuptake inhibitors. ?Nicotine Dependence ?Presents for follow-up visit. Symptoms include fatigue. His urge triggers include company of smokers. The symptoms have been stable. He smokes 1 pack of cigarettes per day.  ? ? ? ?Review of Systems  ?Constitutional:  Positive for fatigue and malaise/fatigue.  ?Eyes:  Negative for blurred vision.  ?Respiratory:  Positive for shortness of breath.   ?Gastrointestinal:  Positive for heartburn.  ?Psychiatric/Behavioral:  Positive for depression.   ?All other systems reviewed and are negative. ? ?   ?Objective:  ? Physical Exam ?Vitals reviewed.  ?Constitutional:   ?   General: He is irritable. He is not in acute distress. ?   Appearance: He is well-developed.  ?HENT:  ?   Head: Normocephalic.  ?   Right Ear: Tympanic membrane normal.  ?   Left Ear: Tympanic membrane normal.  ?Eyes:  ?   General:     ?   Right eye: No discharge.     ?   Left eye: No discharge.  ?   Pupils: Pupils are equal, round, and reactive to light.  ?Neck:  ?   Thyroid: No thyromegaly.  ?Cardiovascular:  ?  Rate and Rhythm: Normal rate and regular rhythm.  ?   Heart sounds: Normal heart sounds. No murmur heard. ?Pulmonary:  ?   Effort: Pulmonary effort is normal. No respiratory distress.  ?   Breath sounds: Normal breath sounds. No wheezing.  ?Abdominal:  ?   General: Bowel sounds are normal. There is no distension.  ?    Palpations: Abdomen is soft.  ?   Tenderness: There is no abdominal tenderness.  ?Musculoskeletal:     ?   General: No tenderness. Normal range of motion.  ?   Cervical back: Normal range of motion and neck supple.  ?Skin: ?   General: Skin is warm and dry.  ?   Findings: No erythema or rash.  ?Neurological:  ?   Mental Status: He is alert and oriented to person, place, and time.  ?   Cranial Nerves: No cranial nerve deficit.  ?   Deep Tendon Reflexes: Reflexes are normal and symmetric.  ?Psychiatric:     ?   Behavior: Behavior normal.     ?   Thought Content: Thought content normal.     ?   Judgment: Judgment normal.  ? ? ? ?BP (!) 156/78   Pulse 64   Temp 97.8 ?F (36.6 ?C) (Temporal)   Ht 6' 2" (1.88 m)   Wt 200 lb 9.6 oz (91 kg)   BMI 25.76 kg/m?  ? ? ?   ?Assessment & Plan:  ?Frank Moses comes in today with chief complaint of Medical Management of Chronic Issues ? ? ?Diagnosis and orders addressed: ? ?1. Hypertension associated with type 2 diabetes mellitus ([place]) ?- CMP14+EGFR ?- CBC with Differential/Platelet ? ?2. Gastroesophageal reflux disease without esophagitis ?- CMP14+EGFR ?- CBC with Differential/Platelet ? ?3. Hyperlipidemia associated with type 2 diabetes mellitus ([place]) ?- CMP14+EGFR ?- CBC with Differential/Platelet ? ?4. Type 2 diabetes mellitus without complication, with no history of insulin use ([place]) ?- Bayer DCA Hb A1c Waived ?- CMP14+EGFR ?- CBC with Differential/Platelet ?- Microalbumin / creatinine urine ratio ? ?5. Microalbuminuria due to type 2 diabetes mellitus ([place]) ?- CMP14+EGFR ?- CBC with Differential/Platelet ? ?6. Alcohol use disorder, severe, dependence ([place]) ?- CMP14+EGFR ?- CBC with Differential/Platelet ? ?7. MDD (major depressive disorder), recurrent, severe, with psychosis ([place]) ?- CMP14+EGFR ?- CBC with Differential/Platelet ? ?8. Schizoaffective disorder, depressive type ([place]) ?- CMP14+EGFR ?- CBC with Differential/Platelet ? ?9. Recurrent major depressive disorder,  in partial remission ([place]) ?- CMP14+EGFR ?- CBC with Differential/Platelet ? ?10. Cigarette nicotine dependence without complication ?- [id number]+[id number] ?- CBC with Differential/Platelet ? ? ?Labs pending ?Pt will call Behavorial health and let them know depression is not well controlled.  ?Health Maintenance reviewed ?Diet and exercise encouraged ? ?Follow up plan: ?3 months  ? ?Evelina Dun, FNP ? ? ? ?

## 2021-04-26 NOTE — Patient Instructions (Signed)
Major Depressive Disorder, Adult °Major depressive disorder (MDD) is a mental health condition. It may also be called clinical depression or unipolar depression. MDD causes symptoms of sadness, hopelessness, and loss of interest in things. These symptoms last most of the day, almost every day, for 2 weeks. MDD can also cause physical symptoms. It can interfere with relationships and with everyday activities, such as work, school, and activities that are usually pleasant. °MDD may be mild, moderate, or severe. It may be single-episode MDD, which happens once, or recurrent MDD, which may occur multiple times. °What are the causes? °The exact cause of this condition is not known. MDD is most likely caused by a combination of things, which may include: °Your personality traits. °Learned or conditioned behaviors or thoughts or feelings that reinforce negativity. °Any alcohol or substance misuse. °Long-term (chronic) physical or mental health illness. °Going through a traumatic experience or major life changes. °What increases the risk? °The following factors may make someone more likely to develop MDD: °A family history of depression. °Being a woman. °Troubled family relationships. °Abnormally low levels of certain brain chemicals. °Traumatic or painful events in childhood, especially abuse or loss of a parent. °A lot of stress from life experiences, such as poor living conditions or discrimination. °Chronic physical illness or other mental health disorders. °What are the signs or symptoms? °The main symptoms of MDD usually include: °Constant depressed or irritable mood. °A loss of interest in things and activities. °Other symptoms include: °Sleeping or eating too much or too little. °Unexplained weight gain or weight loss. °Tiredness or low energy. °Being agitated, restless, or weak. °Feeling hopeless, worthless, or guilty. °Trouble thinking clearly or making decisions. °Thoughts of suicide or thoughts of harming  others. °Isolating oneself or avoiding other people or activities. °Trouble completing tasks, work, or any normal obligations. °Severe symptoms of this condition may include: °Psychotic depression.This may include false beliefs, or delusions. It may also include seeing, hearing, tasting, smelling, or feeling things that are not real (hallucinations). °Chronic depression or persistent depressive disorder. This is low-level depression that lasts for at least 2 years. °Melancholic depression, or feeling extremely sad and hopeless. °Catatonic depression, which includes trouble speaking and trouble moving. °How is this diagnosed? °This condition may be diagnosed based on: °Your symptoms. °Your medical and mental health history. You may be asked questions about your lifestyle, including any drug and alcohol use. °A physical exam. °Blood tests to rule out other conditions. °MDD is confirmed if you have the following symptoms most of the day, nearly every day, in a 2-week period: °Either a depressed mood or loss of interest. °At least four other MDD symptoms. °How is this treated? °This condition is usually treated by mental health professionals, such as psychologists, psychiatrists, and clinical social workers. You may need more than one type of treatment. Treatment may include: °Psychotherapy, also called talk therapy or counseling. Types of psychotherapy include: °Cognitive behavioral therapy (CBT). This teaches you to recognize unhealthy feelings, thoughts, and behaviors, and replace them with positive thoughts and actions. °Interpersonal therapy (IPT). This helps you to improve the way you communicate with others or relate to them. °Family therapy. This treatment includes members of your family. °Medicines to treat anxiety and depression. These medicines help to balance the brain chemicals that affect your emotions. °Lifestyle changes. You may be asked to: °Limit alcohol use and avoid drug use. °Get regular  exercise. °Get plenty of sleep. °Make healthy eating choices. °Spend more time outdoors. °Brain stimulation. This may   be done if symptoms are very severe and other treatments have not worked. Examples of this treatment are electroconvulsive therapy and transcranial magnetic stimulation. °Follow these instructions at home: °Activity °Exercise regularly and spend time outdoors. °Find activities that you enjoy doing, and make time to do them. °Find healthy ways to manage stress, such as: °Meditation or deep breathing. °Spending time in nature. °Journaling. °Return to your normal activities as told by your health care provider. Ask your health care provider what activities are safe for you. °Alcohol and drug use °If you drink alcohol: °Limit how much you use to: °0-1 drink a day for women who are not pregnant. °0-2 drinks a day for men. °Be aware of how much alcohol is in your drink. In the [place] one drink equals one 12 oz bottle of beer (355 mL), one 5 oz glass of wine (148 mL), or one 1½ oz glass of hard liquor (44 mL). °Discuss your alcohol use with your health care provider. Alcohol can affect any antidepressant medicines you are taking. °Discuss any drug use with your health care provider. °General instructions ° °Take over-the-counter and prescription medicines only as told by your health care provider. °Eat a healthy diet and get plenty of sleep. °Consider joining a support group. Your health care provider may be able to recommend one. °Keep all follow-up visits as told by your health care provider. This is important. °Where to find more information °National Alliance on Mental Illness: www.nami.org °[place] National Institute of Mental Health: www.nimh.nih.gov °Contact a health care provider if: °Your symptoms get worse. °You develop new symptoms. °Get help right away if: °You self-harm. °You have serious thoughts about hurting yourself or others. °You hallucinate. °If you ever feel like you may hurt yourself or  others, or have thoughts about taking your own life, get help right away. Go to your nearest emergency department or: °Call your local emergency services (911 in the [place] °Call a suicide crisis helpline, such as the National Suicide Prevention Lifeline at 1-800-273-8255 or 988 in the [place] This is open 24 hours a day in the [place] °Text the Crisis Text Line at 741741 (in the [place] °Summary °Major depressive disorder (MDD) is a mental health condition. MDD causes symptoms of sadness, hopelessness, and loss of interest in things. These symptoms last most of the day, almost every day, for 2 weeks. °The symptoms of MDD can interfere with relationships and with everyday activities. °Treatments and support are available for people who develop MDD. You may need more than one type of treatment. °Get help right away if you have serious thoughts about hurting yourself or others. °This information is not intended to replace advice given to you by your health care provider. Make sure you discuss any questions you have with your health care provider. °Document Revised: 08/08/2020 Document Reviewed: 12/25/2018 °Elsevier Patient Education © 2022 [employer] Inc. ° °

## 2021-04-27 LAB — CMP14+EGFR
ALT: 32 IU/L (ref 0–44)
AST: 33 IU/L (ref 0–40)
Albumin/Globulin Ratio: 1.6 (ref 1.2–2.2)
Albumin: 4.5 g/dL (ref 3.8–4.9)
Alkaline Phosphatase: 118 IU/L (ref 44–121)
BUN/Creatinine Ratio: 14 (ref 9–20)
BUN: 11 mg/dL (ref 6–24)
Bilirubin Total: 0.4 mg/dL (ref 0.0–1.2)
CO2: 22 mmol/L (ref 20–29)
Calcium: 9.6 mg/dL (ref 8.7–10.2)
Chloride: 105 mmol/L (ref 96–106)
Creatinine, Ser: 0.8 mg/dL (ref 0.76–1.27)
Globulin, Total: 2.9 g/dL (ref 1.5–4.5)
Glucose: 203 mg/dL — ABNORMAL HIGH (ref 70–99)
Potassium: 4.6 mmol/L (ref 3.5–5.2)
Sodium: 141 mmol/L (ref 134–144)
Total Protein: 7.4 g/dL (ref 6.0–8.5)
eGFR: 104 mL/min/{1.73_m2} (ref >59)

## 2021-04-27 LAB — CBC WITH DIFFERENTIAL/PLATELET
Basophils Absolute: 0.1 10*3/uL (ref 0.0–0.2)
Basos: 1 %
EOS (ABSOLUTE): 0.4 10*3/uL (ref 0.0–0.4)
Eos: 6 %
Hematocrit: 42.9 % (ref 37.5–51.0)
Hemoglobin: 15.5 g/dL (ref 13.0–17.7)
Immature Grans (Abs): 0 10*3/uL (ref 0.0–0.1)
Immature Granulocytes: 0 %
Lymphocytes Absolute: 2.1 10*3/uL (ref 0.7–3.1)
Lymphs: 30 %
MCH: 32.8 pg (ref 26.6–33.0)
MCHC: 36.1 g/dL — ABNORMAL HIGH (ref 31.5–35.7)
MCV: 91 fL (ref 79–97)
Monocytes Absolute: 0.7 10*3/uL (ref 0.1–0.9)
Monocytes: 10 %
Neutrophils Absolute: 3.7 10*3/uL (ref 1.4–7.0)
Neutrophils: 53 %
Platelets: 227 10*3/uL (ref 150–450)
RBC: 4.73 x10E6/uL (ref 4.14–5.80)
RDW: 12.9 % (ref 11.6–15.4)
WBC: 6.9 10*3/uL (ref 3.4–10.8)

## 2021-04-27 LAB — MICROALBUMIN / CREATININE URINE RATIO
Creatinine, Urine: 76 mg/dL
Microalb/Creat Ratio: 192 mg/g creat — ABNORMAL HIGH (ref 0–29)
Microalbumin, Urine: 146.2 ug/mL

## 2021-04-29 ENCOUNTER — Other Ambulatory Visit: Payer: Self-pay | Admitting: Family

## 2021-04-29 MED ORDER — METFORMIN HCL ER 750 MG PO TB24: 750.0000 mg | ORAL_TABLET | Freq: Every day | ORAL | 1 refills | Status: DC

## 2021-06-10 ENCOUNTER — Other Ambulatory Visit: Payer: Self-pay | Admitting: Family

## 2021-06-10 DIAGNOSIS — E1169 Type 2 diabetes mellitus with other specified complication: Secondary | ICD-10-CM

## 2021-06-10 DIAGNOSIS — M1A0720 Idiopathic chronic gout, left ankle and foot, without tophus (tophi): Secondary | ICD-10-CM

## 2021-06-10 DIAGNOSIS — I152 Hypertension secondary to endocrine disorders: Secondary | ICD-10-CM

## 2021-06-14 ENCOUNTER — Other Ambulatory Visit: Payer: Self-pay | Admitting: Family

## 2021-06-14 DIAGNOSIS — K219 Gastro-esophageal reflux disease without esophagitis: Secondary | ICD-10-CM

## 2021-07-09 LAB — HM DIABETES EYE EXAM

## 2021-07-26 ENCOUNTER — Other Ambulatory Visit: Payer: Self-pay | Admitting: Family

## 2021-07-29 ENCOUNTER — Ambulatory Visit (INDEPENDENT_AMBULATORY_CARE_PROVIDER_SITE_OTHER): Payer: Medicare Other | Admitting: Family

## 2021-07-29 ENCOUNTER — Encounter: Payer: Self-pay | Admitting: Family

## 2021-07-29 VITALS — BP 136/77 | HR 54 | Temp 96.4°F | Ht 74.0 in | Wt 193.0 lb

## 2021-07-29 DIAGNOSIS — E1169 Type 2 diabetes mellitus with other specified complication: Secondary | ICD-10-CM | POA: Diagnosis not present

## 2021-07-29 DIAGNOSIS — F1020 Alcohol dependence, uncomplicated: Secondary | ICD-10-CM

## 2021-07-29 DIAGNOSIS — I152 Hypertension secondary to endocrine disorders: Secondary | ICD-10-CM

## 2021-07-29 DIAGNOSIS — E119 Type 2 diabetes mellitus without complications: Secondary | ICD-10-CM

## 2021-07-29 DIAGNOSIS — F17210 Nicotine dependence, cigarettes, uncomplicated: Secondary | ICD-10-CM | POA: Diagnosis not present

## 2021-07-29 DIAGNOSIS — Z9889 Other specified postprocedural states: Secondary | ICD-10-CM

## 2021-07-29 DIAGNOSIS — K219 Gastro-esophageal reflux disease without esophagitis: Secondary | ICD-10-CM | POA: Diagnosis not present

## 2021-07-29 DIAGNOSIS — E1159 Type 2 diabetes mellitus with other circulatory complications: Secondary | ICD-10-CM

## 2021-07-29 DIAGNOSIS — F3341 Major depressive disorder, recurrent, in partial remission: Secondary | ICD-10-CM

## 2021-07-29 DIAGNOSIS — M542 Cervicalgia: Secondary | ICD-10-CM | POA: Diagnosis not present

## 2021-07-29 DIAGNOSIS — E785 Hyperlipidemia, unspecified: Secondary | ICD-10-CM

## 2021-07-29 LAB — CMP14+EGFR
ALT: 25 IU/L (ref 0–44)
AST: 29 IU/L (ref 0–40)
Albumin/Globulin Ratio: 1.8 (ref 1.2–2.2)
Albumin: 4.7 g/dL (ref 3.8–4.9)
Alkaline Phosphatase: 115 IU/L (ref 44–121)
BUN/Creatinine Ratio: 8 — ABNORMAL LOW (ref 9–20)
BUN: 7 mg/dL (ref 6–24)
Bilirubin Total: 0.6 mg/dL (ref 0.0–1.2)
CO2: 19 mmol/L — ABNORMAL LOW (ref 20–29)
Calcium: 9.8 mg/dL (ref 8.7–10.2)
Chloride: 105 mmol/L (ref 96–106)
Creatinine, Ser: 0.84 mg/dL (ref 0.76–1.27)
Globulin, Total: 2.6 g/dL (ref 1.5–4.5)
Glucose: 145 mg/dL — ABNORMAL HIGH (ref 70–99)
Potassium: 4.4 mmol/L (ref 3.5–5.2)
Sodium: 141 mmol/L (ref 134–144)
Total Protein: 7.3 g/dL (ref 6.0–8.5)
eGFR: 102 mL/min/{1.73_m2} (ref >59)

## 2021-07-29 LAB — BAYER DCA HB A1C WAIVED: HB A1C (BAYER DCA - WAIVED): 7.2 % — ABNORMAL HIGH (ref 4.8–5.6)

## 2021-07-29 MED ORDER — ATORVASTATIN CALCIUM 20 MG PO TABS: 20.0000 mg | ORAL_TABLET | Freq: Every day | ORAL | 1 refills | Status: DC

## 2021-07-29 MED ORDER — PANTOPRAZOLE SODIUM 40 MG PO TBEC: 40.0000 mg | DELAYED_RELEASE_TABLET | Freq: Every day | ORAL | 1 refills | Status: DC

## 2021-07-29 MED ORDER — AMLODIPINE BESYLATE 10 MG PO TABS: 10.0000 mg | ORAL_TABLET | Freq: Every day | ORAL | 1 refills | Status: DC

## 2021-07-29 MED ORDER — LOSARTAN POTASSIUM 100 MG PO TABS: 100.0000 mg | ORAL_TABLET | Freq: Every day | ORAL | 1 refills | Status: DC

## 2021-07-29 MED ORDER — VENLAFAXINE HCL ER 75 MG PO CP24: 225.0000 mg | ORAL_CAPSULE | Freq: Every day | ORAL | 0 refills | Status: DC

## 2021-07-29 NOTE — Progress Notes (Signed)
Subjective:    Patient ID: Frank Moses, male    DOB: 05-09-1965, 56 y.o.   MRN: [id number]  Chief Complaint  Patient presents with   Medical Management of Chronic Issues   PT presents to the office today for chronic follow up. He is followed by [facility] every 6 month for PTSD, depression, and Schizoaffective.    Reports drinking 2-3 beers several times a week. He is trying to quit.  Has had three DUI's in the past, but trying to get his license back. He reports he is trying to quit.   He has gout and takes allopurinol 300 mg. States his last flare up was over a year ago.  Diabetes He presents for his follow-up diabetic visit. He has type 2 diabetes mellitus. There are no hypoglycemic associated symptoms. Associated symptoms include fatigue. Pertinent negatives for diabetes include no blurred vision and no foot paresthesias. Symptoms are stable. Pertinent negatives for diabetic complications include no peripheral neuropathy. Risk factors for coronary artery disease include dyslipidemia, diabetes mellitus, male sex, hypertension and sedentary lifestyle. He is following a generally unhealthy diet. (Does not check BS at home ) Eye exam is current.  Hypertension This is a chronic problem. The current episode started more than 1 year ago. The problem has been resolved since onset. The problem is controlled. Pertinent negatives include no blurred vision, malaise/fatigue, peripheral edema or shortness of breath. Risk factors for coronary artery disease include dyslipidemia, diabetes mellitus, obesity, male gender and sedentary lifestyle. The current treatment provides moderate improvement. There is no history of heart failure.  Gastroesophageal Reflux He complains of belching and heartburn. This is a chronic problem. The current episode started more than 1 year ago. The problem occurs occasionally. Associated symptoms include fatigue. He has tried a PPI for the symptoms. The treatment provided  moderate relief.  Hyperlipidemia This is a chronic problem. The current episode started more than 1 year ago. Pertinent negatives include no shortness of breath. Current antihyperlipidemic treatment includes statins. The current treatment provides moderate improvement of lipids. Risk factors for coronary artery disease include dyslipidemia, diabetes mellitus, male sex, hypertension and a sedentary lifestyle.  Depression        This is a chronic problem.  The current episode started more than 1 year ago.   The onset quality is gradual.   The problem occurs intermittently.  Associated symptoms include fatigue.  Associated symptoms include no helplessness, no hopelessness and not sad.  Past treatments include SNRIs - Serotonin and norepinephrine reuptake inhibitors. Nicotine Dependence Presents for follow-up visit. Symptoms include fatigue. His urge triggers include company of smokers. The symptoms have been stable. He smokes 1 pack (1 1/2 pack) of cigarettes per day.      Review of Systems  Constitutional:  Positive for fatigue. Negative for malaise/fatigue.  Eyes:  Negative for blurred vision.  Respiratory:  Negative for shortness of breath.   Gastrointestinal:  Positive for heartburn.  Psychiatric/Behavioral:  Positive for depression.   All other systems reviewed and are negative.      Objective:   Physical Exam Vitals reviewed.  Constitutional:      General: He is not in acute distress.    Appearance: He is well-developed.  HENT:     Head: Normocephalic.     Right Ear: Tympanic membrane normal.     Left Ear: Tympanic membrane normal.  Eyes:     General:        Right eye: No discharge.  Left eye: No discharge.     Pupils: Pupils are equal, round, and reactive to light.  Neck:     Thyroid: No thyromegaly.  Cardiovascular:     Rate and Rhythm: Normal rate and regular rhythm.     Heart sounds: Normal heart sounds. No murmur heard. Pulmonary:     Effort: Pulmonary effort  is normal. No respiratory distress.     Breath sounds: Normal breath sounds. No wheezing.  Abdominal:     General: Bowel sounds are normal. There is no distension.     Palpations: Abdomen is soft.     Tenderness: There is no abdominal tenderness.  Musculoskeletal:        General: No tenderness. Normal range of motion.     Cervical back: Normal range of motion and neck supple.  Skin:    General: Skin is warm and dry.     Findings: No erythema or rash.  Neurological:     Mental Status: He is alert and oriented to person, place, and time.     Cranial Nerves: No cranial nerve deficit.     Deep Tendon Reflexes: Reflexes are normal and symmetric.  Psychiatric:        Behavior: Behavior normal.        Thought Content: Thought content normal.        Judgment: Judgment normal.       BP (!) 155/99   Pulse (!) 54   Temp (!) 96.4 F (35.8 C) (Temporal)   Ht 6' 2"  (1.88 m)   Wt 193 lb (87.5 kg)   SpO2 93%   BMI 24.78 kg/m      Assessment & Plan:  Frank Moses comes in today with chief complaint of Medical Management of Chronic Issues   Diagnosis and orders addressed:  1. Hypertension associated with type 2 diabetes mellitus (HCC) - amLODipine (NORVASC) 10 MG tablet; Take 1 tablet (10 mg total) by mouth daily.  Dispense: 90 tablet; Refill: 1 - losartan (COZAAR) 100 MG tablet; Take 1 tablet (100 mg total) by mouth daily.  Dispense: 90 tablet; Refill: 1 - CMP14+EGFR  2. Hyperlipidemia associated with type 2 diabetes mellitus (HCC) - atorvastatin (LIPITOR) 20 MG tablet; Take 1 tablet (20 mg total) by mouth daily.  Dispense: 90 tablet; Refill: 1 - CMP14+EGFR  3. H/O cervical spine surgery - CMP14+EGFR  4. Neck pain - CMP14+EGFR  5. Gastroesophageal reflux disease without esophagitis - pantoprazole (PROTONIX) 40 MG tablet; Take 1 tablet (40 mg total) by mouth daily.  Dispense: 90 tablet; Refill: 1 - CMP14+EGFR  6. Recurrent major depressive disorder, in partial remission  (HCC) - venlafaxine XR (EFFEXOR-XR) 75 MG 24 hr capsule; Take 3 capsules (225 mg total) by mouth daily with breakfast.  Dispense: 90 capsule; Refill: 0 - CMP14+EGFR  7. Type 2 diabetes mellitus without complication, with no history of insulin use (HCC) - Bayer DCA Hb A1c Waived - CMP14+EGFR  8. Cigarette nicotine dependence without complication - DTO67+TIWP  9. Alcohol use disorder, severe, dependence ([place]) - CMP14+EGFR   Labs pending Health Maintenance reviewed Diet and exercise encouraged  Follow up plan: 4 months    Evelina Dun, FNP

## 2021-07-29 NOTE — Patient Instructions (Signed)

## 2021-10-21 ENCOUNTER — Other Ambulatory Visit: Payer: Self-pay | Admitting: *Deleted

## 2021-10-21 DIAGNOSIS — M1A0720 Idiopathic chronic gout, left ankle and foot, without tophus (tophi): Secondary | ICD-10-CM

## 2021-10-21 MED ORDER — ALLOPURINOL 300 MG PO TABS: 300.0000 mg | ORAL_TABLET | Freq: Every day | ORAL | 0 refills | Status: DC

## 2021-11-10 IMAGING — CT CT HEAD W/O CM
4 series · 16 of 47 positions shown, 18 images · non-contrast
Comparison: CT brain and cervical spine 07/01/2017, 07/02/2018

CLINICAL DATA: Trauma

EXAM:
CT HEAD WITHOUT CONTRAST
CT CERVICAL SPINE WITHOUT CONTRAST
TECHNIQUE: Multidetector CT imaging of the head and cervical spine was
performed following the standard protocol without intravenous
contrast. Multiplanar CT image reconstructions of the cervical spine
were also generated.

[Series 3: head without · axial · non-contrast · 0.45mm/px · z∈[-119,+1]mm · 7 of 34 slices shown, 9 images]
[im 5/34  brain]
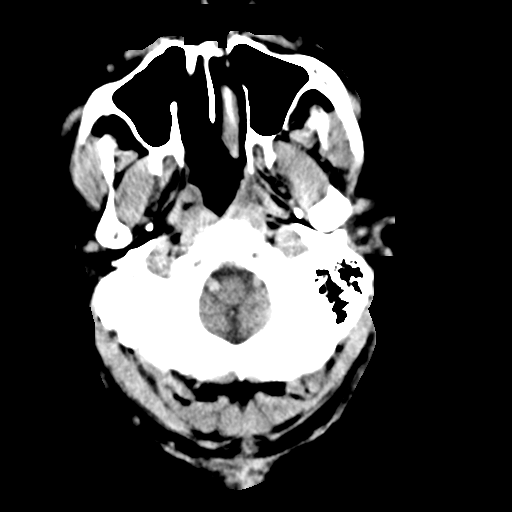
[im 5/34  bone]
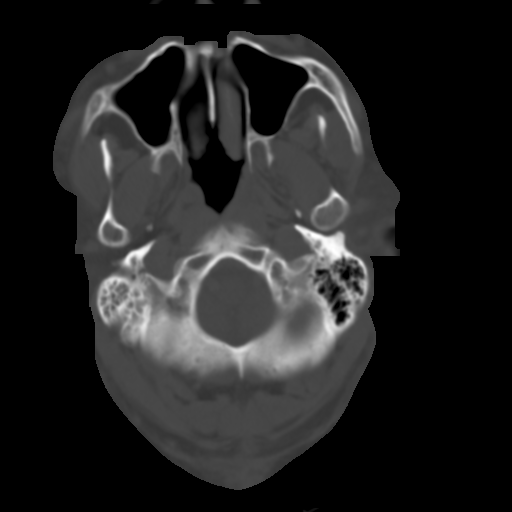
[im 9/34  brain]
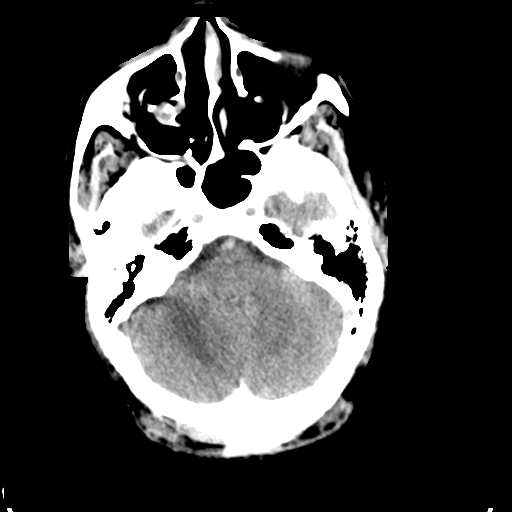
[im 13/34  brain]
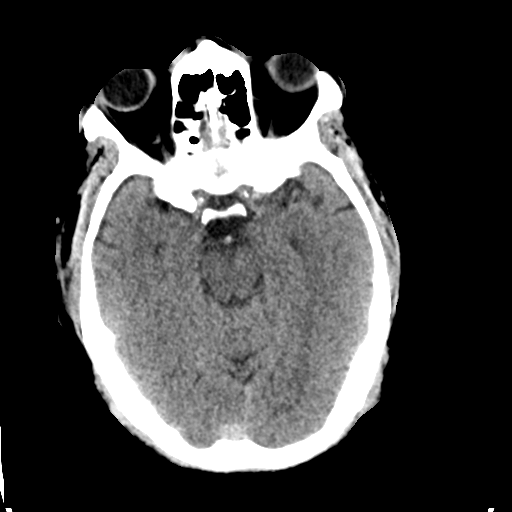
[im 17/34  brain]
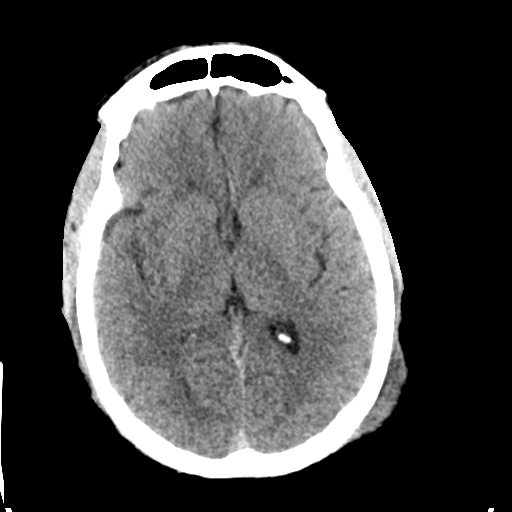
[im 21/34  brain]
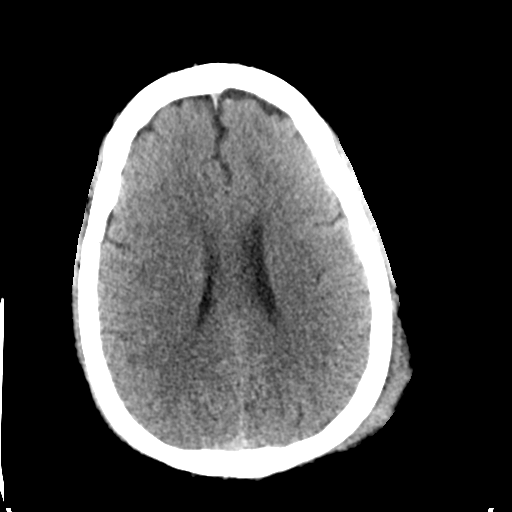
[im 21/34  bone]
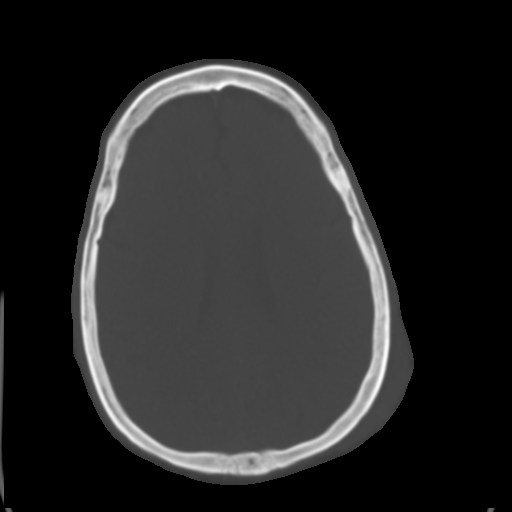
[im 25/34  brain]
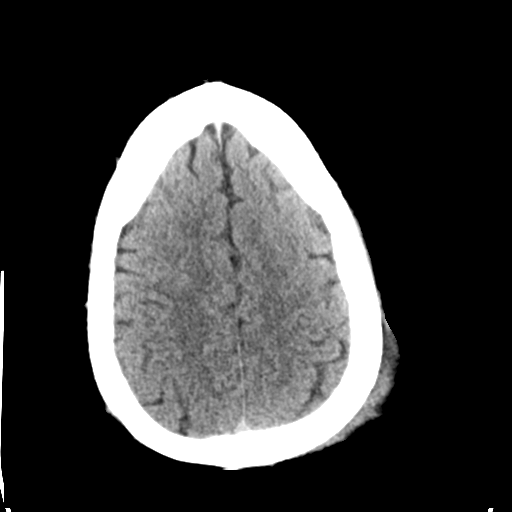
[im 29/34  brain]
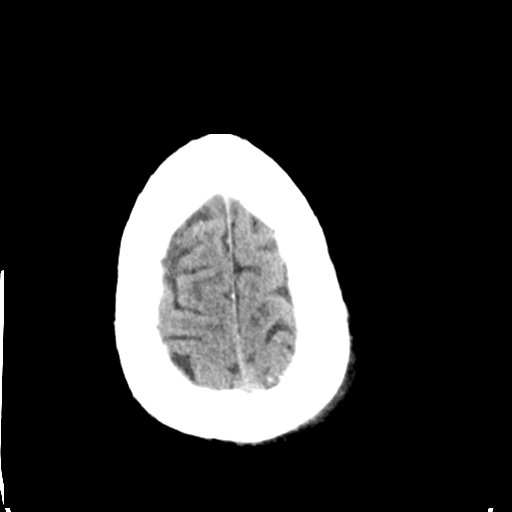

[Series 4: head bone · axial · 0.45mm/px · z∈[-123,-89]mm · 3 of 85 slices shown]
[im 9/85  bone]
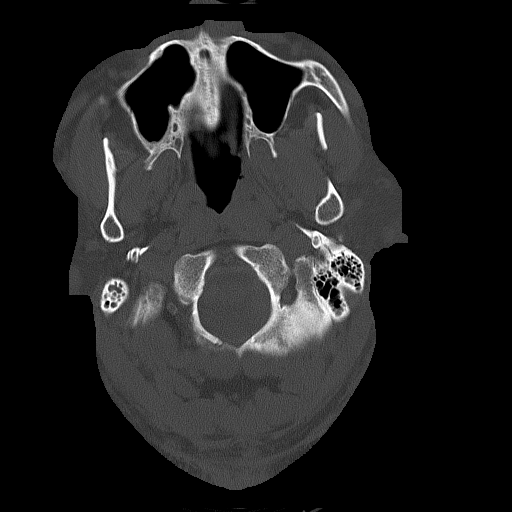
[im 17/85  bone]
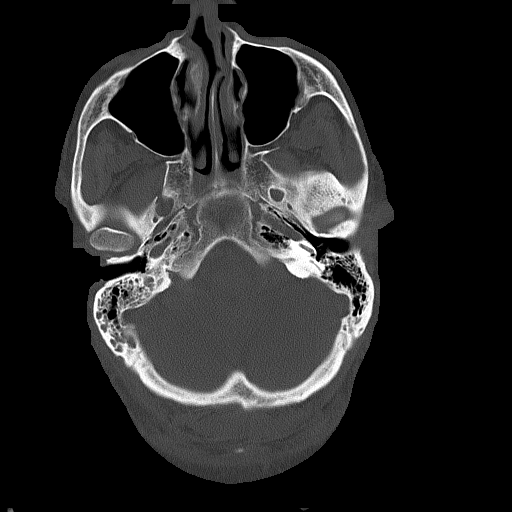
[im 26/85  bone]
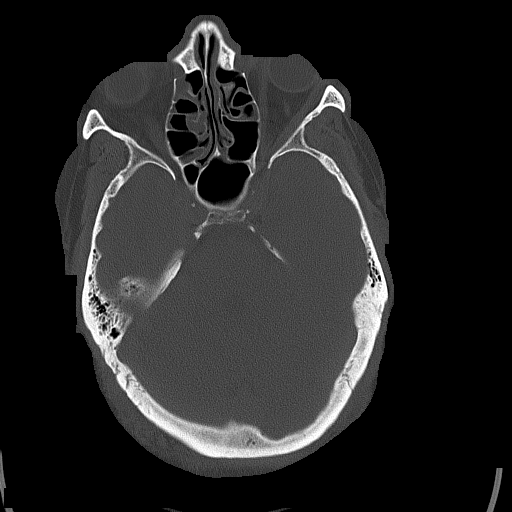

[Series 5: head without cor · coronal · non-contrast · 0.39mm/px · 3 of 67 slices shown]
[im 23/67  brain]
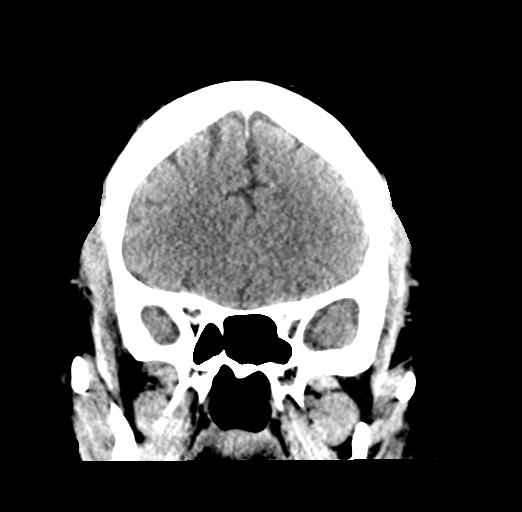
[im 30/67  brain]
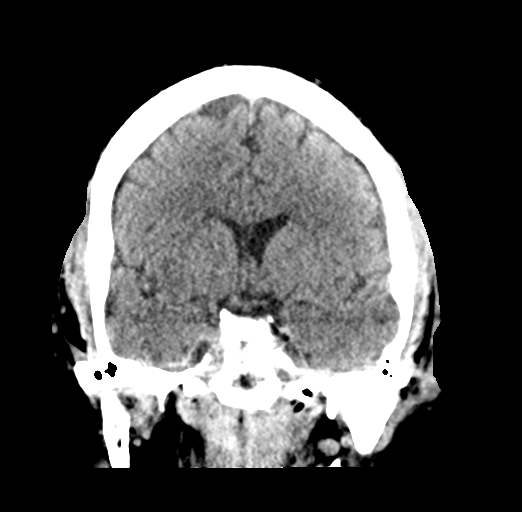
[im 37/67  brain]
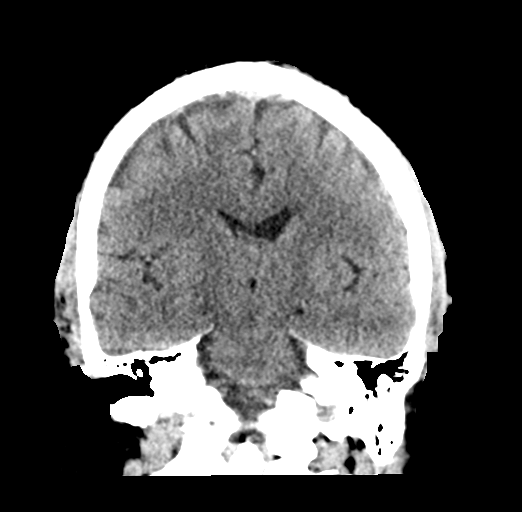

[Series 6: head without sag · sagittal · non-contrast · 0.39mm/px · 3 of 45 slices shown]
[im 15/45  brain]
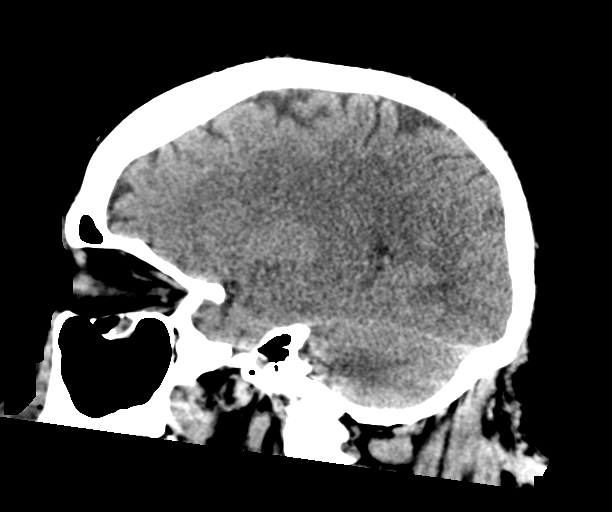
[im 23/45  brain]
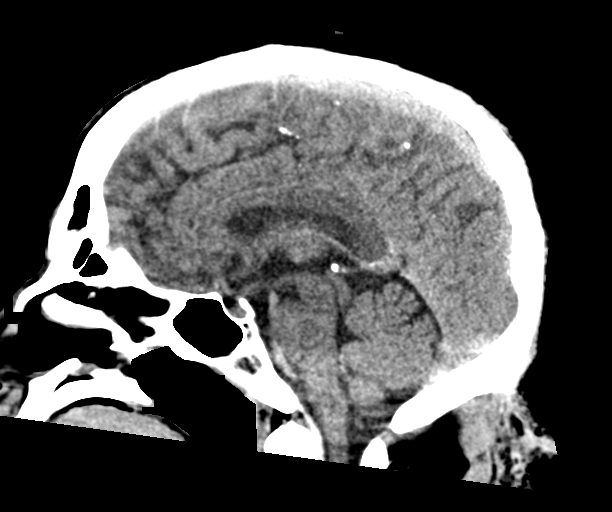
[im 30/45  brain]
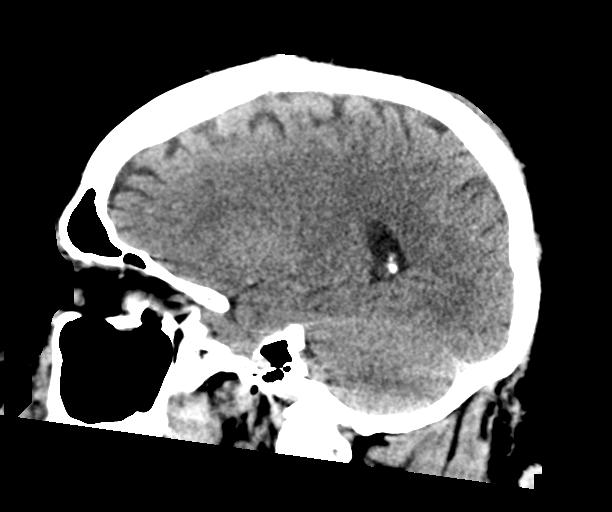

[16 of 47 positions shown; findings below may reference images not displayed]

FINDINGS: CT HEAD FINDINGS

Brain: No acute territorial infarction, hemorrhage, or intracranial
mass. The ventricles are nonenlarged.

Vascular: No hyperdense vessels. Scattered carotid vascular
calcification

Skull: Normal. Negative for fracture or focal lesion.

Sinuses/Orbits: Moderate mucosal thickening in the sinuses. Fluid in
the right mastoid air cells.

Other: Moderate to large left parietal scalp hematoma

CT CERVICAL SPINE FINDINGS

Alignment: Trace retrolisthesis C3 on C4. Generalized straightening.
Facet alignment is maintained

Skull base and vertebrae: No acute fracture. No primary bone lesion
or focal pathologic process.

Soft tissues and spinal canal: No prevertebral fluid or swelling. No
visible canal hematoma.

Disc levels: Post fusion changes C4 through C6. Moderate
degenerative change at C3-C4 and C6-C7. Facet degenerative changes
at multiple levels with foraminal stenosis.

Upper chest: Apical emphysema

Other: None
IMPRESSION: 1. No CT evidence for acute intracranial abnormality. Moderate to
large left posterior scalp hematoma
2. Post fusion changes C4 through C6.  No acute osseous abnormality.

## 2021-11-29 ENCOUNTER — Ambulatory Visit: Payer: Medicare Other | Admitting: Family

## 2021-12-05 ENCOUNTER — Encounter: Payer: Self-pay | Admitting: Family

## 2021-12-05 ENCOUNTER — Ambulatory Visit (INDEPENDENT_AMBULATORY_CARE_PROVIDER_SITE_OTHER): Payer: Medicare Other | Admitting: Family

## 2021-12-05 VITALS — BP 130/74 | HR 105 | Temp 97.9°F | Ht 74.0 in | Wt 190.6 lb

## 2021-12-05 DIAGNOSIS — M1A0720 Idiopathic chronic gout, left ankle and foot, without tophus (tophi): Secondary | ICD-10-CM | POA: Diagnosis not present

## 2021-12-05 DIAGNOSIS — E1169 Type 2 diabetes mellitus with other specified complication: Secondary | ICD-10-CM | POA: Diagnosis not present

## 2021-12-05 DIAGNOSIS — E1159 Type 2 diabetes mellitus with other circulatory complications: Secondary | ICD-10-CM | POA: Diagnosis not present

## 2021-12-05 DIAGNOSIS — Z0000 Encounter for general adult medical examination without abnormal findings: Secondary | ICD-10-CM | POA: Diagnosis not present

## 2021-12-05 DIAGNOSIS — R809 Proteinuria, unspecified: Secondary | ICD-10-CM

## 2021-12-05 DIAGNOSIS — E1129 Type 2 diabetes mellitus with other diabetic kidney complication: Secondary | ICD-10-CM

## 2021-12-05 DIAGNOSIS — F17210 Nicotine dependence, cigarettes, uncomplicated: Secondary | ICD-10-CM

## 2021-12-05 DIAGNOSIS — K219 Gastro-esophageal reflux disease without esophagitis: Secondary | ICD-10-CM

## 2021-12-05 DIAGNOSIS — F333 Major depressive disorder, recurrent, severe with psychotic symptoms: Secondary | ICD-10-CM

## 2021-12-05 DIAGNOSIS — F251 Schizoaffective disorder, depressive type: Secondary | ICD-10-CM

## 2021-12-05 DIAGNOSIS — F1020 Alcohol dependence, uncomplicated: Secondary | ICD-10-CM

## 2021-12-05 DIAGNOSIS — E119 Type 2 diabetes mellitus without complications: Secondary | ICD-10-CM | POA: Diagnosis not present

## 2021-12-05 DIAGNOSIS — I152 Hypertension secondary to endocrine disorders: Secondary | ICD-10-CM | POA: Diagnosis not present

## 2021-12-05 DIAGNOSIS — F3341 Major depressive disorder, recurrent, in partial remission: Secondary | ICD-10-CM

## 2021-12-05 DIAGNOSIS — Z0001 Encounter for general adult medical examination with abnormal findings: Secondary | ICD-10-CM | POA: Diagnosis not present

## 2021-12-05 DIAGNOSIS — E785 Hyperlipidemia, unspecified: Secondary | ICD-10-CM

## 2021-12-05 LAB — BAYER DCA HB A1C WAIVED: HB A1C (BAYER DCA - WAIVED): 6.6 % — ABNORMAL HIGH (ref 4.8–5.6)

## 2021-12-05 MED ORDER — VENLAFAXINE HCL ER 75 MG PO CP24: 225.0000 mg | ORAL_CAPSULE | Freq: Every day | ORAL | 0 refills | Status: DC

## 2021-12-05 MED ORDER — AMLODIPINE BESYLATE 10 MG PO TABS: 10.0000 mg | ORAL_TABLET | Freq: Every day | ORAL | 1 refills | Status: DC

## 2021-12-05 MED ORDER — ATORVASTATIN CALCIUM 20 MG PO TABS: 20.0000 mg | ORAL_TABLET | Freq: Every day | ORAL | 1 refills | Status: DC

## 2021-12-05 MED ORDER — ALLOPURINOL 300 MG PO TABS: 300.0000 mg | ORAL_TABLET | Freq: Every day | ORAL | 1 refills | Status: DC

## 2021-12-05 MED ORDER — LOSARTAN POTASSIUM 100 MG PO TABS: 100.0000 mg | ORAL_TABLET | Freq: Every day | ORAL | 1 refills | Status: DC

## 2021-12-05 MED ORDER — ASPIRIN 81 MG PO TBEC: 81.0000 mg | DELAYED_RELEASE_TABLET | Freq: Every day | ORAL | 4 refills | Status: AC

## 2021-12-05 MED ORDER — PANTOPRAZOLE SODIUM 40 MG PO TBEC: 40.0000 mg | DELAYED_RELEASE_TABLET | Freq: Every day | ORAL | 1 refills | Status: DC

## 2021-12-05 NOTE — Progress Notes (Signed)
Subjective:    Patient ID: Frank Moses, male    DOB: Mar 12, 1965, 56 y.o.   MRN: [id number]  Chief Complaint  Patient presents with   Medical Management of Chronic Issues   PT presents to the office today for chronic follow up. He is followed by [facility] every 6 month for PTSD, depression, and Schizoaffective.    Reports drinking 12 beers a day most days of the week. Marland Kitchen He is trying to quit.  Has had three DUI's in the past, but trying to get his license back. He reports he trying to cut back.    He has gout and takes allopurinol 300 mg. States his last flare up was over a year ago.  Diabetes He presents for his follow-up diabetic visit. He has type 2 diabetes mellitus. Pertinent negatives for diabetes include no blurred vision, no fatigue and no foot paresthesias. Symptoms are stable. Risk factors for coronary artery disease include dyslipidemia, diabetes mellitus and hypertension. He is following a generally healthy diet. (Does not check at home) Eye exam is current.  Hypertension This is a chronic problem. The current episode started more than 1 year ago. The problem has been waxing and waning since onset. The problem is uncontrolled. Pertinent negatives include no blurred vision, malaise/fatigue, peripheral edema or shortness of breath. Risk factors for coronary artery disease include dyslipidemia, diabetes mellitus, obesity, male gender, smoking/tobacco exposure and sedentary lifestyle. The current treatment provides moderate improvement.  Gastroesophageal Reflux He complains of belching and heartburn. This is a chronic problem. The current episode started in the past 7 days. The problem occurs rarely. The symptoms are aggravated by ETOH. Pertinent negatives include no fatigue. He has tried a PPI for the symptoms. The treatment provided moderate relief.  Hyperlipidemia This is a chronic problem. The current episode started more than 1 year ago. The problem is controlled. Recent  lipid tests were reviewed and are normal. Pertinent negatives include no shortness of breath. Current antihyperlipidemic treatment includes statins. The current treatment provides moderate improvement of lipids. Risk factors for coronary artery disease include diabetes mellitus, dyslipidemia, male sex, hypertension and a sedentary lifestyle.  Depression        This is a chronic problem.  The current episode started more than 1 year ago.   The onset quality is gradual.   The problem occurs intermittently.  Associated symptoms include no fatigue, no helplessness, no hopelessness and not sad.  Past treatments include MAOIs - Monoamine oxidase inhibitors. Nicotine Dependence Presents for follow-up visit. Symptoms are negative for fatigue. His urge triggers include company of smokers. The symptoms have been stable. He smokes < 1/2 a pack of cigarettes per day.      Review of Systems  Constitutional:  Negative for fatigue and malaise/fatigue.  Eyes:  Negative for blurred vision.  Respiratory:  Negative for shortness of breath.   Gastrointestinal:  Positive for heartburn.  Psychiatric/Behavioral:  Positive for depression.   All other systems reviewed and are negative.  Family History  Problem Relation Age of Onset   Alcoholism Father    Cancer Father        unknown    Social History   Socioeconomic History   Marital status: Single    Spouse name: Not on file   Number of children: 2   Years of education: Not on file   Highest education level: Not on file  Occupational History   Not on file  Tobacco Use   Smoking status: Every Day  Packs/day: 0.50    Types: Cigarettes   Smokeless tobacco: Never  Vaping Use   Vaping Use: Former  Substance and Sexual Activity   Alcohol use: Yes    Comment: 3-6 cans of beer every 3-4 days   Drug use: Never   Sexual activity: Yes    Birth control/protection: None  Other Topics Concern   Not on file  Social History Narrative   ** Merged History  Encounter **    Single.   2 daughters.   4 grandchildren, ages 83-10 in 2023.   Social Determinants of Health   Financial Resource Strain: Low Risk  (02/04/2021)   Overall Financial Resource Strain (CARDIA)    Difficulty of Paying Living Expenses: Not hard at all  Food Insecurity: No Food Insecurity (02/04/2021)   Hunger Vital Sign    Worried About Running Out of Food in the Last Year: Never true    Ran Out of Food in the Last Year: Never true  Transportation Needs: No Transportation Needs (02/04/2021)   PRAPARE - Hydrologist (Medical): No    Lack of Transportation (Non-Medical): No  Physical Activity: Sufficiently Active (02/04/2021)   Exercise Vital Sign    Days of Exercise per Week: 5 days    Minutes of Exercise per Session: 30 min  Stress: No Stress Concern Present (02/04/2021)   [place]    Feeling of Stress : Not at all  Social Connections: Moderately Integrated (02/04/2021)   Social Connection and Isolation Panel [NHANES]    Frequency of Communication with Friends and Family: More than three times a week    Frequency of Social Gatherings with Friends and Family: More than three times a week    Attends Religious Services: 1 to 4 times per year    Active Member of [employer] or Organizations: Yes    Attends Archivist Meetings: 1 to 4 times per year    Marital Status: Never married       Objective:   Physical Exam Vitals reviewed.  Constitutional:      General: He is not in acute distress.    Appearance: He is well-developed.  HENT:     Head: Normocephalic.     Right Ear: Tympanic membrane normal.     Left Ear: Tympanic membrane normal.  Eyes:     General:        Right eye: No discharge.        Left eye: No discharge.     Pupils: Pupils are equal, round, and reactive to light.  Neck:     Thyroid: No thyromegaly.  Cardiovascular:     Rate and Rhythm: Normal rate and  regular rhythm.     Heart sounds: Normal heart sounds. No murmur heard. Pulmonary:     Effort: Pulmonary effort is normal. No respiratory distress.     Breath sounds: Normal breath sounds. No wheezing.  Abdominal:     General: Bowel sounds are normal. There is no distension.     Palpations: Abdomen is soft.     Tenderness: There is no abdominal tenderness.  Musculoskeletal:        General: No tenderness. Normal range of motion.     Cervical back: Normal range of motion and neck supple.  Skin:    General: Skin is warm and dry.     Findings: No erythema or rash.  Neurological:     Mental Status: He is alert  and oriented to person, place, and time.     Cranial Nerves: No cranial nerve deficit.     Deep Tendon Reflexes: Reflexes are normal and symmetric.  Psychiatric:        Behavior: Behavior normal.        Thought Content: Thought content normal.        Judgment: Judgment normal.       BP (!) 147/84   Pulse (!) 105   Temp 97.9 F (36.6 C) (Temporal)   Ht _0  (1.88 m)   Wt 190 lb 9.6 oz (86.5 kg)   BMI 24.47 kg/m      Assessment & Plan:  Frank Moses comes in today with chief complaint of Medical Management of Chronic Issues   Diagnosis and orders addressed:  1. 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  Dispense: 90 tablet; Refill: 1 - CMP14+EGFR - CBC with Differential/Platelet  2. Hypertension associated with type 2 diabetes mellitus (HCC) - amLODipine (NORVASC) 10 MG tablet; Take 1 tablet (10 mg total) by mouth daily.  Dispense: 90 tablet; Refill: 1 - losartan (COZAAR) 100 MG tablet; Take 1 tablet (100 mg total) by mouth daily.  Dispense: 90 tablet; Refill: 1 - CMP14+EGFR - CBC with Differential/Platelet  3. Hyperlipidemia associated with type 2 diabetes mellitus (HCC) - atorvastatin (LIPITOR) 20 MG tablet; Take 1 tablet (20 mg total) by mouth daily.   Dispense: 90 tablet; Refill: 1 - CMP14+EGFR - CBC with Differential/Platelet  4. Gastroesophageal reflux disease without esophagitis - pantoprazole (PROTONIX) 40 MG tablet; Take 1 tablet (40 mg total) by mouth daily.  Dispense: 90 tablet; Refill: 1 - CMP14+EGFR - CBC with Differential/Platelet - Lipid panel  5. Recurrent major depressive disorder, in partial remission (HCC) - venlafaxine XR (EFFEXOR-XR) 75 MG 24 hr capsule; Take 3 capsules (225 mg total) by mouth daily with breakfast.  Dispense: 90 capsule; Refill: 0 - CMP14+EGFR - CBC with Differential/Platelet  6. Schizoaffective disorder, depressive type ([place]) - CMP14+EGFR - CBC with Differential/Platelet  7. MDD (major depressive disorder), recurrent, severe, with psychosis ([place])  - CMP14+EGFR - CBC with Differential/Platelet  8. Alcohol use disorder, severe, dependence (HCC) - CMP14+EGFR - CBC with Differential/Platelet  9. Cigarette nicotine dependence without complication - CHE52+DPOE - CBC with Differential/Platelet  10. Type 2 diabetes mellitus without complication, with no history of insulin use (HCC) - CMP14+EGFR - CBC with Differential/Platelet - Bayer DCA Hb A1c Waived - Microalbumin / creatinine urine ratio  11. Microalbuminuria due to type 2 diabetes mellitus (HCC) - CMP14+EGFR - CBC with Differential/Platelet  12. Annual physical exam - CMP14+EGFR - CBC with Differential/Platelet - Bayer DCA Hb A1c Waived - Lipid panel - Microalbumin / creatinine urine ratio - TSH - PSA, total and free   Labs pending Health Maintenance reviewed Diet and exercise encouraged  Follow up plan: 6 months    Evelina Dun, FNP

## 2021-12-05 NOTE — Patient Instructions (Signed)
Health Maintenance, Male Adopting a healthy lifestyle and getting preventive care are important in promoting health and wellness. Ask your health care provider about: The right schedule for you to have regular tests and exams. Things you can do on your own to prevent diseases and keep yourself healthy. What should I know about diet, weight, and exercise? Eat a healthy diet  Eat a diet that includes plenty of vegetables, fruits, low-fat dairy products, and lean protein. Do not eat a lot of foods that are high in solid fats, added sugars, or sodium. Maintain a healthy weight Body mass index (BMI) is a measurement that can be used to identify possible weight problems. It estimates body fat based on height and weight. Your health care provider can help determine your BMI and help you achieve or maintain a healthy weight. Get regular exercise Get regular exercise. This is one of the most important things you can do for your health. Most adults should: Exercise for at least 150 minutes each week. The exercise should increase your heart rate and make you sweat (moderate-intensity exercise). Do strengthening exercises at least twice a week. This is in addition to the moderate-intensity exercise. Spend less time sitting. Even light physical activity can be beneficial. Watch cholesterol and blood lipids Have your blood tested for lipids and cholesterol at 56 years of age, then have this test every 5 years. You may need to have your cholesterol levels checked more often if: Your lipid or cholesterol levels are high. You are older than 56 years of age. You are at high risk for heart disease. What should I know about cancer screening? Many types of cancers can be detected early and may often be prevented. Depending on your health history and family history, you may need to have cancer screening at various ages. This may include screening for: Colorectal cancer. Prostate cancer. Skin cancer. Lung  cancer. What should I know about heart disease, diabetes, and high blood pressure? Blood pressure and heart disease High blood pressure causes heart disease and increases the risk of stroke. This is more likely to develop in people who have high blood pressure readings or are overweight. Talk with your health care provider about your target blood pressure readings. Have your blood pressure checked: Every 3-5 years if you are 18-39 years of age. Every year if you are 40 years old or older. If you are between the ages of 65 and 75 and are a current or former smoker, ask your health care provider if you should have a one-time screening for abdominal aortic aneurysm (AAA). Diabetes Have regular diabetes screenings. This checks your fasting blood sugar level. Have the screening done: Once every three years after age 45 if you are at a normal weight and have a low risk for diabetes. More often and at a younger age if you are overweight or have a high risk for diabetes. What should I know about preventing infection? Hepatitis B If you have a higher risk for hepatitis B, you should be screened for this virus. Talk with your health care provider to find out if you are at risk for hepatitis B infection. Hepatitis C Blood testing is recommended for: Everyone born from 1945 through 1965. Anyone with known risk factors for hepatitis C. Sexually transmitted infections (STIs) You should be screened each year for STIs, including gonorrhea and chlamydia, if: You are sexually active and are younger than 56 years of age. You are older than 56 years of age and your   health care provider tells you that you are at risk for this type of infection. Your sexual activity has changed since you were last screened, and you are at increased risk for chlamydia or gonorrhea. Ask your health care provider if you are at risk. Ask your health care provider about whether you are at high risk for HIV. Your health care provider  may recommend a prescription medicine to help prevent HIV infection. If you choose to take medicine to prevent HIV, you should first get tested for HIV. You should then be tested every 3 months for as long as you are taking the medicine. Follow these instructions at home: Alcohol use Do not drink alcohol if your health care provider tells you not to drink. If you drink alcohol: Limit how much you have to 0-2 drinks a day. Know how much alcohol is in your drink. In the [place] one drink equals one 12 oz bottle of beer (355 mL), one 5 oz glass of wine (148 mL), or one 1 oz glass of hard liquor (44 mL). Lifestyle Do not use any products that contain nicotine or tobacco. These products include cigarettes, chewing tobacco, and vaping devices, such as e-cigarettes. If you need help quitting, ask your health care provider. Do not use street drugs. Do not share needles. Ask your health care provider for help if you need support or information about quitting drugs. General instructions Schedule regular health, dental, and eye exams. Stay current with your vaccines. Tell your health care provider if: You often feel depressed. You have ever been abused or do not feel safe at home. Summary Adopting a healthy lifestyle and getting preventive care are important in promoting health and wellness. Follow your health care provider's instructions about healthy diet, exercising, and getting tested or screened for diseases. Follow your health care provider's instructions on monitoring your cholesterol and blood pressure. This information is not intended to replace advice given to you by your health care provider. Make sure you discuss any questions you have with your health care provider. Document Revised: 06/04/2020 Document Reviewed: 06/04/2020 Elsevier Patient Education  2023 Elsevier Inc.  

## 2021-12-06 LAB — PSA, TOTAL AND FREE
PSA, Free Pct: 36.7 %
PSA, Free: 0.22 ng/mL
Prostate Specific Ag, Serum: 0.6 ng/mL (ref 0.0–4.0)

## 2021-12-06 LAB — CMP14+EGFR
ALT: 24 IU/L (ref 0–44)
AST: 26 IU/L (ref 0–40)
Albumin/Globulin Ratio: 1.8 (ref 1.2–2.2)
Albumin: 4.7 g/dL (ref 3.8–4.9)
Alkaline Phosphatase: 148 IU/L — ABNORMAL HIGH (ref 44–121)
BUN/Creatinine Ratio: 11 (ref 9–20)
BUN: 10 mg/dL (ref 6–24)
Bilirubin Total: 0.4 mg/dL (ref 0.0–1.2)
CO2: 21 mmol/L (ref 20–29)
Calcium: 9.6 mg/dL (ref 8.7–10.2)
Chloride: 101 mmol/L (ref 96–106)
Creatinine, Ser: 0.91 mg/dL (ref 0.76–1.27)
Globulin, Total: 2.6 g/dL (ref 1.5–4.5)
Glucose: 144 mg/dL — ABNORMAL HIGH (ref 70–99)
Potassium: 4.1 mmol/L (ref 3.5–5.2)
Sodium: 137 mmol/L (ref 134–144)
Total Protein: 7.3 g/dL (ref 6.0–8.5)
eGFR: 99 mL/min/{1.73_m2} (ref >59)

## 2021-12-06 LAB — CBC WITH DIFFERENTIAL/PLATELET
Basophils Absolute: 0 10*3/uL (ref 0.0–0.2)
Basos: 1 %
EOS (ABSOLUTE): 0.4 10*3/uL (ref 0.0–0.4)
Eos: 6 %
Hematocrit: 44.1 % (ref 37.5–51.0)
Hemoglobin: 15.6 g/dL (ref 13.0–17.7)
Immature Grans (Abs): 0 10*3/uL (ref 0.0–0.1)
Immature Granulocytes: 0 %
Lymphocytes Absolute: 2.1 10*3/uL (ref 0.7–3.1)
Lymphs: 38 %
MCH: 32.7 pg (ref 26.6–33.0)
MCHC: 35.4 g/dL (ref 31.5–35.7)
MCV: 93 fL (ref 79–97)
Monocytes Absolute: 0.4 10*3/uL (ref 0.1–0.9)
Monocytes: 7 %
Neutrophils Absolute: 2.6 10*3/uL (ref 1.4–7.0)
Neutrophils: 48 %
Platelets: 224 10*3/uL (ref 150–450)
RBC: 4.77 x10E6/uL (ref 4.14–5.80)
RDW: 12.1 % (ref 11.6–15.4)
WBC: 5.5 10*3/uL (ref 3.4–10.8)

## 2021-12-06 LAB — LIPID PANEL
Chol/HDL Ratio: 4 ratio (ref 0.0–5.0)
Cholesterol, Total: 178 mg/dL (ref 100–199)
HDL: 45 mg/dL
LDL Chol Calc (NIH): 75 mg/dL (ref 0–99)
Triglycerides: 368 mg/dL — ABNORMAL HIGH (ref 0–149)
VLDL Cholesterol Cal: 58 mg/dL — ABNORMAL HIGH (ref 5–40)

## 2021-12-06 LAB — TSH: TSH: 0.748 u[IU]/mL (ref 0.450–4.500)

## 2021-12-17 ENCOUNTER — Other Ambulatory Visit: Payer: Self-pay | Admitting: Family

## 2021-12-17 DIAGNOSIS — E1169 Type 2 diabetes mellitus with other specified complication: Secondary | ICD-10-CM

## 2021-12-23 ENCOUNTER — Other Ambulatory Visit: Payer: Self-pay | Admitting: Family

## 2021-12-23 DIAGNOSIS — K219 Gastro-esophageal reflux disease without esophagitis: Secondary | ICD-10-CM

## 2022-01-17 ENCOUNTER — Other Ambulatory Visit: Payer: Self-pay | Admitting: Family

## 2022-01-17 DIAGNOSIS — E1159 Type 2 diabetes mellitus with other circulatory complications: Secondary | ICD-10-CM

## 2022-02-03 ENCOUNTER — Other Ambulatory Visit: Payer: Self-pay | Admitting: Family

## 2022-03-18 ENCOUNTER — Telehealth: Payer: Self-pay | Admitting: Family

## 2022-03-18 NOTE — Telephone Encounter (Signed)
Called patient to schedule Medicare Annual Wellness Visit (AWV). Left message for patient to call back and schedule Medicare Annual Wellness Visit (AWV).  Last date of AWV: 02/04/2021   Please schedule an appointment at any time with [facility].  If any questions, please contact me at [phone].  Thank you,  [place] ??[phone]

## 2022-03-18 NOTE — Telephone Encounter (Signed)
Contacted [name] to schedule their annual wellness visit. Appointment made for 04/02/2022.  Thank you,  Colletta [place],  [place] [facility] Direct Dial ??[phone]

## 2022-04-02 ENCOUNTER — Ambulatory Visit (INDEPENDENT_AMBULATORY_CARE_PROVIDER_SITE_OTHER): Payer: 59

## 2022-04-02 VITALS — Ht 74.0 in | Wt 190.0 lb

## 2022-04-02 DIAGNOSIS — Z0000 Encounter for general adult medical examination without abnormal findings: Secondary | ICD-10-CM

## 2022-04-02 NOTE — Patient Instructions (Signed)
Frank Moses , Thank you for taking time to come for your Medicare Wellness Visit. I appreciate your ongoing commitment to your health goals. Please review the following plan we discussed and let me know if I can assist you in the future.   These are the goals we discussed:  Goals      Exercise 3x per week (30 min per time)     Pt states he would like to exercise more.     Quit Smoking     Pt states he would like to quit smoking in 2023.        This is a list of the screening recommended for you and due dates:  Health Maintenance  Topic Date Due   Zoster (Shingles) Vaccine (2 of 2) 03/05/2021   COVID-19 Vaccine (4 - 2023-24 season) 09/27/2021   Complete foot exam   01/15/2022   Yearly kidney health urinalysis for diabetes  04/27/2022   Hemoglobin A1C  06/05/2022   Eye exam for diabetics  07/10/2022   Yearly kidney function blood test for diabetes  12/06/2022   Medicare Annual Wellness Visit  04/02/2023   DTaP/Tdap/Td vaccine (2 - Td or Tdap) 07/01/2028   Colon Cancer Screening  11/30/2028   Flu Shot  Completed   Hepatitis C Screening: USPSTF Recommendation to screen - Ages 18-79 yo.  Completed   HIV Screening  Completed   HPV Vaccine  Aged Out    Advanced directives: Advance directive discussed with you today. I have provided a copy for you to complete at home and have notarized. Once this is complete please bring a copy in to our office so we can scan it into your chart.   Conditions/risks identified: Aim for 30 minutes of exercise or brisk walking, 6-8 glasses of water, and 5 servings of fruits and vegetables each day.   Next appointment: Follow up in one year for your annual wellness visit   Preventive Care 40-64 Years, Male Preventive care refers to lifestyle choices and visits with your health care provider that can promote health and wellness. What does preventive care include? A yearly physical exam. This is also called an annual well check. Dental exams once or twice  a year. Routine eye exams. Ask your health care provider how often you should have your eyes checked. Personal lifestyle choices, including: Daily care of your teeth and gums. Regular physical activity. Eating a healthy diet. Avoiding tobacco and drug use. Limiting alcohol use. Practicing safe sex. Taking low-dose aspirin every day starting at age 39. What happens during an annual well check? The services and screenings done by your health care provider during your annual well check will depend on your age, overall health, lifestyle risk factors, and family history of disease. Counseling  Your health care provider may ask you questions about your: Alcohol use. Tobacco use. Drug use. Emotional well-being. Home and relationship well-being. Sexual activity. Eating habits. Work and work Statistician. Screening  You may have the following tests or measurements: Height, weight, and BMI. Blood pressure. Lipid and cholesterol levels. These may be checked every 5 years, or more frequently if you are over 68 years old. Skin check. Lung cancer screening. You may have this screening every year starting at age 49 if you have a 30-pack-year history of smoking and currently smoke or have quit within the past 15 years. Fecal occult blood test (FOBT) of the stool. You may have this test every year starting at age 17. Flexible sigmoidoscopy or colonoscopy. You  may have a sigmoidoscopy every 5 years or a colonoscopy every 10 years starting at age 74. Prostate cancer screening. Recommendations will vary depending on your family history and other risks. Hepatitis C blood test. Hepatitis B blood test. Sexually transmitted disease (STD) testing. Diabetes screening. This is done by checking your blood sugar (glucose) after you have not eaten for a while (fasting). You may have this done every 1-3 years. Discuss your test results, treatment options, and if necessary, the need for more tests with your  health care provider. Vaccines  Your health care provider may recommend certain vaccines, such as: Influenza vaccine. This is recommended every year. Tetanus, diphtheria, and acellular pertussis (Tdap, Td) vaccine. You may need a Td booster every 10 years. Zoster vaccine. You may need this after age 69. Pneumococcal 13-valent conjugate (PCV13) vaccine. You may need this if you have certain conditions and have not been vaccinated. Pneumococcal polysaccharide (PPSV23) vaccine. You may need one or two doses if you smoke cigarettes or if you have certain conditions. Talk to your health care provider about which screenings and vaccines you need and how often you need them. This information is not intended to replace advice given to you by your health care provider. Make sure you discuss any questions you have with your health care provider. Document Released: 02/09/2015 Document Revised: 10/03/2015 Document Reviewed: 11/14/2014 Elsevier Interactive Patient Education  2017 [place] Prevention in the Home Falls can cause injuries. They can happen to people of all ages. There are many things you can do to make your home safe and to help prevent falls. What can I do on the outside of my home? Regularly fix the edges of walkways and driveways and fix any cracks. Remove anything that might make you trip as you walk through a door, such as a raised step or threshold. Trim any bushes or trees on the path to your home. Use bright outdoor lighting. Clear any walking paths of anything that might make someone trip, such as rocks or tools. Regularly check to see if handrails are loose or broken. Make sure that both sides of any steps have handrails. Any raised decks and porches should have guardrails on the edges. Have any leaves, snow, or ice cleared regularly. Use sand or salt on walking paths during winter. Clean up any spills in your garage right away. This includes oil or grease spills. What  can I do in the bathroom? Use night lights. Install grab bars by the toilet and in the tub and shower. Do not use towel bars as grab bars. Use non-skid mats or decals in the tub or shower. If you need to sit down in the shower, use a plastic, non-slip stool. Keep the floor dry. Clean up any water that spills on the floor as soon as it happens. Remove soap buildup in the tub or shower regularly. Attach bath mats securely with double-sided non-slip rug tape. Do not have throw rugs and other things on the floor that can make you trip. What can I do in the bedroom? Use night lights. Make sure that you have a light by your bed that is easy to reach. Do not use any sheets or blankets that are too big for your bed. They should not hang down onto the floor. Have a firm chair that has side arms. You can use this for support while you get dressed. Do not have throw rugs and other things on the floor that can make you  trip. What can I do in the kitchen? Clean up any spills right away. Avoid walking on wet floors. Keep items that you use a lot in easy-to-reach places. If you need to reach something above you, use a strong step stool that has a grab bar. Keep electrical cords out of the way. Do not use floor polish or wax that makes floors slippery. If you must use wax, use non-skid floor wax. Do not have throw rugs and other things on the floor that can make you trip. What can I do with my stairs? Do not leave any items on the stairs. Make sure that there are handrails on both sides of the stairs and use them. Fix handrails that are broken or loose. Make sure that handrails are as long as the stairways. Check any carpeting to make sure that it is firmly attached to the stairs. Fix any carpet that is loose or worn. Avoid having throw rugs at the top or bottom of the stairs. If you do have throw rugs, attach them to the floor with carpet tape. Make sure that you have a light switch at the top of the  stairs and the bottom of the stairs. If you do not have them, ask someone to add them for you. What else can I do to help prevent falls? Wear shoes that: Do not have high heels. Have rubber bottoms. Are comfortable and fit you well. Are closed at the toe. Do not wear sandals. If you use a stepladder: Make sure that it is fully opened. Do not climb a closed stepladder. Make sure that both sides of the stepladder are locked into place. Ask someone to hold it for you, if possible. Clearly mark and make sure that you can see: Any grab bars or handrails. First and last steps. Where the edge of each step is. Use tools that help you move around (mobility aids) if they are needed. These include: Canes. Walkers. Scooters. Crutches. Turn on the lights when you go into a dark area. Replace any light bulbs as soon as they burn out. Set up your furniture so you have a clear path. Avoid moving your furniture around. If any of your floors are uneven, fix them. If there are any pets around you, be aware of where they are. Review your medicines with your doctor. Some medicines can make you feel dizzy. This can increase your chance of falling. Ask your doctor what other things that you can do to help prevent falls. This information is not intended to replace advice given to you by your health care provider. Make sure you discuss any questions you have with your health care provider. Document Released: 11/09/2008 Document Revised: 06/21/2015 Document Reviewed: 02/17/2014 Elsevier Interactive Patient Education  2017 [employer].

## 2022-04-02 NOTE — Progress Notes (Signed)
Subjective:   Frank Moses is a 57 y.o. male who presents for Medicare Annual/Subsequent preventive examination.  I connected with  Frank Moses on 04/02/22 by a audio enabled telemedicine application and verified that I am speaking with the correct person using two identifiers.  Patient Location: Home  Provider Location: Home Office  I discussed the limitations of evaluation and management by telemedicine. The patient expressed understanding and agreed to proceed.  Review of Systems     Cardiac Risk Factors include: advanced age (>57mn, >>76women);diabetes mellitus;dyslipidemia;hypertension;male gender;smoking/ tobacco exposure     Objective:    Today's Vitals   04/02/22 1453  Weight: 190 lb (86.2 kg)  Height: '6\' 2"'$  (1.88 m)   Body mass index is 24.39 kg/m.     04/02/2022    3:02 PM 02/04/2021    8:24 AM 12/01/2018    7:22 AM 07/02/2018    9:26 PM 09/25/2016    8:38 PM 09/25/2016    2:37 PM 09/25/2016   12:14 PM  Advanced Directives  Does Patient Have a Medical Advance Directive? No No No No  No No  Would patient like information on creating a medical advance directive? Yes (MAU/Ambulatory/Procedural Areas - Information given) No - Patient declined Yes (MAU/Ambulatory/Procedural Areas - Information given)   No - Patient declined      Information is confidential and restricted. Go to Review Flowsheets to unlock data.    Current Medications (verified) Outpatient Encounter Medications as of 04/02/2022  Medication Sig   allopurinol (ZYLOPRIM) 300 MG tablet Take 1 tablet (300 mg total) by mouth daily.   amLODipine (NORVASC) 10 MG tablet Take 1 tablet (10 mg total) by mouth daily.   aspirin EC 81 MG tablet Take 1 tablet (81 mg total) by mouth daily. Swallow whole.   atorvastatin (LIPITOR) 20 MG tablet Take 1 tablet (20 mg total) by mouth daily.   blood glucose meter kit and supplies Dispense based on patient and insurance preference. Use up to four times daily as directed. (FOR  ICD-10 E10.9, E11.9).   losartan (COZAAR) 100 MG tablet Take 1 tablet (100 mg total) by mouth daily.   metFORMIN (GLUCOPHAGE-XR) 750 MG 24 hr tablet Take 1 tablet (750 mg total) by mouth daily with breakfast.   pantoprazole (PROTONIX) 40 MG tablet Take 1 tablet (40 mg total) by mouth daily.   venlafaxine XR (EFFEXOR-XR) 75 MG 24 hr capsule Take 3 capsules (225 mg total) by mouth daily with breakfast.   No facility-administered encounter medications on file as of 04/02/2022.    Allergies (verified) Olanzapine, Orange oil, Other, Shrimp [shellfish allergy], Tomato, Vicodin [hydrocodone-acetaminophen], Orange fruit [citrus], and Tomato   History: Past Medical History:  Diagnosis Date   Arthritis    Asthma    Bipolar 1 disorder [place]    [place]ipolar affective [place]    Depression    Gout    Hyperlipidemia    Hypertension    Peptic ulcer    Schizophrenia ([place]    St[facility]    Past Surgical History:  Procedure Laterality Date   bil foot surgery     CERVICAL FUSION     COLONOSCOPY N/A 12/01/2018   Procedure: COLONOSCOPY;  Surgeon: RRogene Houston MD;  Location: [facility];  Service: Endoscopy;  Laterality: N/A;  830   FOOT SURGERY     HIP SURGERY Left    NECK SURGERY     cyst removed from neck    Family History  Problem Relation  Age of Onset   Alcoholism Father    Cancer Father        unknown    Social History   Socioeconomic History   Marital status: Single    Spouse name: Not on file   Number of children: 2   Years of education: Not on file   Highest education level: Not on file  Occupational History   Not on file  Tobacco Use   Smoking status: Every Day    Packs/day: 0.50    Types: Cigarettes   Smokeless tobacco: Never  Vaping Use   Vaping Use: Former  Substance and Sexual Activity   Alcohol use: Yes    Comment: 3-6 cans of beer every 3-4 days   Drug use: Never   Sexual activity: Yes    Birth control/protection: None  Other Topics Concern   Not on file   Social History Narrative   ** Merged History Encounter **    Single.   2 daughters.   4 grandchildren, ages 32-10 in 2023.   Social Determinants of Health   Financial Resource Strain: Low Risk  (04/02/2022)   Overall Financial Resource Strain (CARDIA)    Difficulty of Paying Living Expenses: Not hard at all  Food Insecurity: No Food Insecurity (04/02/2022)   Hunger Vital Sign    Worried About Running Out of Food in the Last Year: Never true    Ran Out of Food in the Last Year: Never true  Transportation Needs: No Transportation Needs (04/02/2022)   PRAPARE - Hydrologist (Medical): No    Lack of Transportation (Non-Medical): No  Physical Activity: Sufficiently Active (04/02/2022)   Exercise Vital Sign    Days of Exercise per Week: 5 days    Minutes of Exercise per Session: 30 min  Stress: No Stress Concern Present (04/02/2022)   [place]    Feeling of Stress : Not at all  Social Connections: Moderately Integrated (04/02/2022)   Social Connection and Isolation Panel [NHANES]    Frequency of Communication with Friends and Family: More than three times a week    Frequency of Social Gatherings with Friends and Family: More than three times a week    Attends Religious Services: 1 to 4 times per year    Active Member of [employer] or Organizations: Yes    Attends Archivist Meetings: 1 to 4 times per year    Marital Status: Never married    Tobacco Counseling Ready to quit: Not Answered Counseling given: Not Answered   Clinical Intake:  Pre-visit preparation completed: Yes  Pain : No/denies pain     Diabetes: Yes CBG done?: No Did pt. bring in CBG monitor from home?: No  How often do you need to have someone help you when you read instructions, pamphlets, or other written materials from your doctor or pharmacy?: 3 - Sometimes  Diabetic?Yes   Nutrition Risk Assessment:  Has  the patient had any N/V/D within the last 2 months?  No  Does the patient have any non-healing wounds?  No  Has the patient had any unintentional weight loss or weight gain?  No   Diabetes:  Is the patient diabetic?  Yes  If diabetic, was a CBG obtained today?  No  Did the patient bring in their glucometer from home?  No  How often do you monitor your CBG's? As needed.   Financial Strains and Diabetes Management:  Are  you having any financial strains with the device, your supplies or your medication? No .  Does the patient want to be seen by Chronic Care Management for management of their diabetes?  No  Would the patient like to be referred to a Nutritionist or for Diabetic Management?  No   Diabetic Exams:  Diabetic Eye Exam: Completed 07/09/21 Diabetic Foot Exam: Overdue, Pt has been advised about the importance in completing this exam. Pt is scheduled for diabetic foot exam on at next office visit .   Interpreter Needed?: No  Information entered by :: Denman George LPN   Activities of Daily Living    04/02/2022    3:02 PM  In your present state of health, do you have any difficulty performing the following activities:  Hearing? 0  Vision? 0  Difficulty concentrating or making decisions? 1  Comment at times  Walking or climbing stairs? 0  Dressing or bathing? 0  Doing errands, shopping? 0  Preparing Food and eating ? N  Using the Toilet? N  In the past six months, have you accidently leaked urine? N  Do you have problems with loss of bowel control? N  Managing your Medications? N  Managing your Finances? N  Housekeeping or managing your Housekeeping? N    Patient Care Team: Sharion Balloon, FNP as PCP - General (Family Medicine) Dione Housekeeper, MD (Family Medicine) Celestia Khat, OD (Optometry) Services, [facility]  Indicate any recent Medical Services you may have received from other than [facility] providers in the past year (date may be approximate).      Assessment:   This is a routine wellness examination for [place].  Hearing/Vision screen Hearing Screening - Comments:: Denies hearing difficulties   Vision Screening - Comments:: up to date with routine eye exams with [facility] Dr. Debe Coder   Dietary issues and exercise activities discussed: Current Exercise Habits: Home exercise routine, Type of exercise: walking, Time (Minutes): 30, Frequency (Times/Week): 5, Weekly Exercise (Minutes/Week): 150, Intensity: Mild   Goals Addressed   None   Depression Screen    04/02/2022    3:04 PM 12/05/2021   10:34 AM 07/29/2021   10:18 AM 04/26/2021   10:42 AM 02/04/2021    8:21 AM 10/26/2020    1:51 PM 10/12/2020    1:58 PM  PHQ 2/9 Scores  PHQ - 2 Score 0 0 0 6 0 2 2  PHQ- 9 Score  '4 7 20  15 15    '$ Fall Risk    04/02/2022    2:54 PM 07/29/2021   10:17 AM 04/26/2021   10:41 AM 02/04/2021    8:25 AM 09/27/2020    2:59 PM  Fall Risk   Falls in the past year? 0 1 0 1 0  Number falls in past yr: 0 1  0   Injury with Fall? 0 1  0   Comment    Cut head, no stiches per pt.   Risk for fall due to : No Fall Risks   Impaired balance/gait;History of fall(s)   Follow up Falls prevention discussed;Education provided;Falls evaluation completed Falls prevention discussed  Falls prevention discussed     FALL RISK PREVENTION PERTAINING TO THE HOME:  Any stairs in or around the home? No  If so, are there any without handrails? No  Home free of loose throw rugs in walkways, pet beds, electrical cords, etc? Yes  Adequate lighting in your home to reduce risk of falls? Yes   ASSISTIVE DEVICES UTILIZED TO  PREVENT FALLS:  Life alert? No  Use of a cane, walker or w/c? No  Grab bars in the bathroom? Yes  Shower chair or bench in shower? No  Elevated toilet seat or a handicapped toilet? Yes   TIMED UP AND GO:  Was the test performed? No . Telephonic visit   Cognitive Function:        04/02/2022    3:03 PM 02/04/2021    8:28 AM  6CIT Screen  What Year? 0 points  0 points  What month? 0 points 0 points  What time? 0 points 0 points  Count back from 20 0 points 0 points  Months in reverse 0 points 0 points  Repeat phrase 0 points 0 points  Total Score 0 points 0 points    Immunizations Immunization History  Administered Date(s) Administered   Influenza,inj,Quad PF,6+ Mos 02/16/2014, 01/08/2015, 11/21/2021   Influenza-Unspecified 02/16/2014, 01/08/2015, 01/08/2021   Moderna Sars-Covid-2 Vaccination 06/13/2019, 07/09/2019, 01/19/2020   Pneumococcal Polysaccharide-23 02/16/2014   Tdap 07/02/2018   Zoster Recombinat (Shingrix) 01/08/2021    TDAP status: Up to date  Flu Vaccine status: Up to date  Pneumococcal vaccine status: Up to date  Covid-19 vaccine status: Information provided on how to obtain vaccines.   Qualifies for Shingles Vaccine? Yes   Zostavax completed No   Shingrix Completed?: No.    Education has been provided regarding the importance of this vaccine. Patient has been advised to call insurance company to determine out of pocket expense if they have not yet received this vaccine. Advised may also receive vaccine at local pharmacy or Health Dept. Verbalized acceptance and understanding.  Screening Tests Health Maintenance  Topic Date Due   Zoster Vaccines- Shingrix (2 of 2) 03/05/2021   COVID-19 Vaccine (4 - 2023-24 season) 09/27/2021   FOOT EXAM  01/15/2022   Diabetic kidney evaluation - Urine ACR  04/27/2022   HEMOGLOBIN A1C  06/05/2022   OPHTHALMOLOGY EXAM  07/10/2022   Diabetic kidney evaluation - eGFR measurement  12/06/2022   Medicare Annual Wellness (AWV)  04/02/2023   DTaP/Tdap/Td (2 - Td or Tdap) 07/01/2028   COLONOSCOPY (Pts 45-73yr Insurance coverage will need to be confirmed)  11/30/2028   INFLUENZA VACCINE  Completed   Hepatitis C Screening  Completed   HIV Screening  Completed   HPV VACCINES  Aged Out    Health Maintenance  Health Maintenance Due  Topic Date Due   Zoster Vaccines- Shingrix (2 of  2) 03/05/2021   COVID-19 Vaccine (4 - 2023-24 season) 09/27/2021   FOOT EXAM  01/15/2022   Diabetic kidney evaluation - Urine ACR  04/27/2022    Colorectal cancer screening: Type of screening: Colonoscopy. Completed 12/01/18. Repeat every 10 years  Lung Cancer Screening: (Low Dose CT Chest recommended if Age 57-80years, 30 pack-year currently smoking OR have quit w/in 15years.) does not qualify.   Lung Cancer Screening Referral: n/a  Additional Screening:  Hepatitis C Screening: does qualify; Completed 11/29/19  Vision Screening: Recommended annual ophthalmology exams for early detection of glaucoma and other disorders of the eye. Is the patient up to date with their annual eye exam?  Yes  Who is the provider or what is the name of the office in which the patient attends annual eye exams? [facility] Dr. MDebe CoderIf pt is not established with a provider, would they like to be referred to a provider to establish care? No .   Dental Screening: Recommended annual dental exams for proper oral hygiene  Community  Resource Referral / Chronic Care Management: CRR required this visit?  No   CCM required this visit?  No      Plan:     I have personally reviewed and noted the following in the patient's chart:   Medical and social history Use of alcohol, tobacco or illicit drugs  Current medications and supplements including opioid prescriptions. Patient is not currently taking opioid prescriptions. Functional ability and status Nutritional status Physical activity Advanced directives List of other physicians Hospitalizations, surgeries, and ER visits in previous 12 months Vitals Screenings to include cognitive, depression, and falls Referrals and appointments  In addition, I have reviewed and discussed with patient certain preventive protocols, quality metrics, and best practice recommendations. A written personalized care plan for preventive services as well as general preventive health  recommendations were provided to patient.     Vanetta Mulders, [place]   [id number]   Due to this being a virtual visit, the after visit summary with patients personalized plan was offered to patient via mail or my-chart.  per request, patient was mailed a copy of AVS.  Nurse Notes: No concerns

## 2022-04-04 ENCOUNTER — Telehealth: Payer: Self-pay | Admitting: Family

## 2022-04-04 NOTE — Telephone Encounter (Signed)
This is noted under Pharmacy in [place].

## 2022-06-05 ENCOUNTER — Encounter: Payer: Self-pay | Admitting: Family

## 2022-06-05 ENCOUNTER — Ambulatory Visit (INDEPENDENT_AMBULATORY_CARE_PROVIDER_SITE_OTHER): Payer: 59 | Admitting: Family

## 2022-06-05 VITALS — BP 150/70 | HR 77 | Temp 97.2°F | Ht 74.0 in | Wt 195.6 lb

## 2022-06-05 DIAGNOSIS — F17210 Nicotine dependence, cigarettes, uncomplicated: Secondary | ICD-10-CM

## 2022-06-05 DIAGNOSIS — F1020 Alcohol dependence, uncomplicated: Secondary | ICD-10-CM

## 2022-06-05 DIAGNOSIS — K219 Gastro-esophageal reflux disease without esophagitis: Secondary | ICD-10-CM | POA: Diagnosis not present

## 2022-06-05 DIAGNOSIS — Z7984 Long term (current) use of oral hypoglycemic drugs: Secondary | ICD-10-CM | POA: Diagnosis not present

## 2022-06-05 DIAGNOSIS — E1159 Type 2 diabetes mellitus with other circulatory complications: Secondary | ICD-10-CM

## 2022-06-05 DIAGNOSIS — M1A0720 Idiopathic chronic gout, left ankle and foot, without tophus (tophi): Secondary | ICD-10-CM

## 2022-06-05 DIAGNOSIS — I152 Hypertension secondary to endocrine disorders: Secondary | ICD-10-CM | POA: Diagnosis not present

## 2022-06-05 DIAGNOSIS — E1169 Type 2 diabetes mellitus with other specified complication: Secondary | ICD-10-CM | POA: Diagnosis not present

## 2022-06-05 DIAGNOSIS — F333 Major depressive disorder, recurrent, severe with psychotic symptoms: Secondary | ICD-10-CM

## 2022-06-05 DIAGNOSIS — F251 Schizoaffective disorder, depressive type: Secondary | ICD-10-CM

## 2022-06-05 DIAGNOSIS — E785 Hyperlipidemia, unspecified: Secondary | ICD-10-CM | POA: Diagnosis not present

## 2022-06-05 LAB — BAYER DCA HB A1C WAIVED: HB A1C (BAYER DCA - WAIVED): 7.1 % — ABNORMAL HIGH (ref 4.8–5.6)

## 2022-06-05 NOTE — Patient Instructions (Signed)

## 2022-06-05 NOTE — Progress Notes (Signed)
Subjective:    Patient ID: Frank Moses, male    DOB: 10/24/1965, 57 y.o.   MRN: [id number]  Chief Complaint  Patient presents with   Medical Management of Chronic Issues    6 month   PT presents to the office today for chronic follow up. He is followed by [facility] every 6 month for PTSD, depression, and Schizoaffective.    Reports drinking 12 beers a day most days of the week. He is trying to quit.  Has had three DUI's in the past, but trying to get his license back. He reports he trying to cut back.    He has gout and takes allopurinol 300 mg. States his last flare up was over a year ago.  Hypertension This is a chronic problem. The current episode started more than 1 year ago. The problem has been waxing and waning since onset. The problem is uncontrolled. Pertinent negatives include no blurred vision, malaise/fatigue, peripheral edema or shortness of breath. Risk factors for coronary artery disease include dyslipidemia, diabetes mellitus, male gender, obesity and sedentary lifestyle. The current treatment provides moderate improvement.  Gastroesophageal Reflux He complains of belching and heartburn. This is a chronic problem. The current episode started more than 1 year ago. The problem occurs occasionally. He has tried a PPI for the symptoms. The treatment provided moderate relief.  Diabetes He presents for his follow-up diabetic visit. He has type 2 diabetes mellitus. Pertinent negatives for diabetes include no blurred vision and no foot paresthesias. Symptoms are stable. Risk factors for coronary artery disease include dyslipidemia, diabetes mellitus, hypertension, male sex and sedentary lifestyle. He is following a generally unhealthy diet. (Does not check BS at home ) Eye exam is not current.  Hyperlipidemia This is a chronic problem. The current episode started more than 1 year ago. The problem is uncontrolled. Recent lipid tests were reviewed and are normal. Pertinent  negatives include no shortness of breath. Current antihyperlipidemic treatment includes statins. The current treatment provides mild improvement of lipids. Risk factors for coronary artery disease include dyslipidemia, diabetes mellitus, hypertension, male sex and a sedentary lifestyle.  Nicotine Dependence Presents for follow-up visit. His urge triggers include company of smokers. The symptoms have been stable. He smokes 1 pack of cigarettes per day.      Review of Systems  Constitutional:  Negative for malaise/fatigue.  Eyes:  Negative for blurred vision.  Respiratory:  Negative for shortness of breath.   Gastrointestinal:  Positive for heartburn.  All other systems reviewed and are negative.      Objective:   Physical Exam Vitals reviewed.  Constitutional:      General: He is not in acute distress.    Appearance: He is well-developed.  HENT:     Head: Normocephalic.     Right Ear: Tympanic membrane normal.     Left Ear: Tympanic membrane normal.  Eyes:     General:        Right eye: No discharge.        Left eye: No discharge.     Pupils: Pupils are equal, round, and reactive to light.  Neck:     Thyroid: No thyromegaly.  Cardiovascular:     Rate and Rhythm: Normal rate and regular rhythm.     Heart sounds: Normal heart sounds. No murmur heard. Pulmonary:     Effort: Pulmonary effort is normal. No respiratory distress.     Breath sounds: Normal breath sounds. No wheezing.  Abdominal:  General: Bowel sounds are normal. There is no distension.     Palpations: Abdomen is soft.     Tenderness: There is no abdominal tenderness.  Musculoskeletal:        General: No tenderness. Normal range of motion.     Cervical back: Normal range of motion and neck supple.  Skin:    General: Skin is warm and dry.     Findings: No erythema or rash.  Neurological:     Mental Status: He is alert and oriented to person, place, and time.     Cranial Nerves: No cranial nerve deficit.      Deep Tendon Reflexes: Reflexes are normal and symmetric.  Psychiatric:        Behavior: Behavior normal.        Thought Content: Thought content normal.        Judgment: Judgment normal.     Diabetic Foot Exam - Simple   Simple Foot Form Diabetic Foot exam was performed with the following findings: Yes 06/05/2022 10:57 AM  Visual Inspection No deformities, no ulcerations, no other skin breakdown bilaterally: Yes Sensation Testing Intact to touch and monofilament testing bilaterally: Yes Pulse Check Posterior Tibialis and Dorsalis pulse intact bilaterally: Yes Comments Toenails long and thick      BP (!) 157/85   Pulse 77   Temp (!) 97.2 F (36.2 C) (Temporal)   Ht 6\' 2"  (1.88 m)   Wt 195 lb 9.6 oz (88.7 kg)   SpO2 93%   BMI 25.11 kg/m      Assessment & Plan:  Frank Moses comes in today with chief complaint of Medical Management of Chronic Issues (6 month)   Diagnosis and orders addressed:  1. Alcohol use disorder, severe, dependence (HCC) - CMP14+EGFR  2. Chronic idiopathic gout involving toe of left foot without tophus - CMP14+EGFR  3. Gastroesophageal reflux disease without esophagitis - CMP14+EGFR  4. Hyperlipidemia associated with type 2 diabetes mellitus (HCC) - CMP14+EGFR  5. Hypertension associated with type 2 diabetes mellitus (HCC) - CMP14+EGFR -BP elevated today, if continues to be elevated on next visit will increase medication  6. MDD (major depressive disorder), recurrent, severe, with psychosis (HCC)  - CMP14+EGFR  7. Cigarette nicotine dependence without complication - CMP14+EGFR  8. Type 2 diabetes mellitus with other specified complication, without long-term current use of insulin (HCC) - Bayer DCA Hb A1c Waived - CMP14+EGFR - Microalbumin / creatinine urine ratio  9. Schizoaffective disorder, depressive type (HCC)  - CMP14+EGFR   Labs pending Health Maintenance reviewed Diet and exercise encouraged  Follow up plan: 4  months    Jannifer Rodney, FNP

## 2022-06-06 LAB — CMP14+EGFR
ALT: 41 IU/L (ref 0–44)
AST: 45 IU/L — ABNORMAL HIGH (ref 0–40)
Albumin/Globulin Ratio: 1.5 (ref 1.2–2.2)
Albumin: 4.4 g/dL (ref 3.8–4.9)
Alkaline Phosphatase: 139 IU/L — ABNORMAL HIGH (ref 44–121)
BUN/Creatinine Ratio: 6 — ABNORMAL LOW (ref 9–20)
BUN: 5 mg/dL — ABNORMAL LOW (ref 6–24)
Bilirubin Total: 0.3 mg/dL (ref 0.0–1.2)
CO2: 18 mmol/L — ABNORMAL LOW (ref 20–29)
Calcium: 9.6 mg/dL (ref 8.7–10.2)
Chloride: 104 mmol/L (ref 96–106)
Creatinine, Ser: 0.83 mg/dL (ref 0.76–1.27)
Globulin, Total: 2.9 g/dL (ref 1.5–4.5)
Glucose: 178 mg/dL — ABNORMAL HIGH (ref 70–99)
Potassium: 4.3 mmol/L (ref 3.5–5.2)
Sodium: 141 mmol/L (ref 134–144)
Total Protein: 7.3 g/dL (ref 6.0–8.5)
eGFR: 102 mL/min/{1.73_m2} (ref >59)

## 2022-06-06 LAB — MICROALBUMIN / CREATININE URINE RATIO
Creatinine, Urine: 145.3 mg/dL
Microalb/Creat Ratio: 192 mg/g creat — ABNORMAL HIGH (ref 0–29)
Microalbumin, Urine: 279.7 ug/mL

## 2022-07-20 ENCOUNTER — Other Ambulatory Visit: Payer: Self-pay | Admitting: Family

## 2022-07-20 DIAGNOSIS — M1A0720 Idiopathic chronic gout, left ankle and foot, without tophus (tophi): Secondary | ICD-10-CM

## 2022-08-08 ENCOUNTER — Other Ambulatory Visit: Payer: Self-pay | Admitting: *Deleted

## 2022-08-08 ENCOUNTER — Encounter: Payer: Self-pay | Admitting: *Deleted

## 2022-08-08 DIAGNOSIS — E1159 Type 2 diabetes mellitus with other circulatory complications: Secondary | ICD-10-CM

## 2022-08-08 MED ORDER — AMLODIPINE BESYLATE 10 MG PO TABS
10.0000 mg | ORAL_TABLET | Freq: Every day | ORAL | 0 refills | Status: DC
Start: 2022-08-08 — End: 2023-01-26

## 2022-08-08 MED ORDER — LOSARTAN POTASSIUM 100 MG PO TABS
100.0000 mg | ORAL_TABLET | Freq: Every day | ORAL | 0 refills | Status: DC
Start: 2022-08-08 — End: 2023-01-26

## 2022-09-20 ENCOUNTER — Encounter (HOSPITAL_COMMUNITY): Payer: Self-pay

## 2022-09-20 ENCOUNTER — Other Ambulatory Visit: Payer: Self-pay

## 2022-09-20 ENCOUNTER — Emergency Department (HOSPITAL_COMMUNITY)
Admission: EM | Admit: 2022-09-20 | Discharge: 2022-09-20 | Disposition: A | Discharge status: Home / Self Care | Payer: 59 | Attending: Emergency Medicine | Admitting: Emergency Medicine

## 2022-09-20 DIAGNOSIS — S59912A Unspecified injury of left forearm, initial encounter: Secondary | ICD-10-CM | POA: Diagnosis present

## 2022-09-20 DIAGNOSIS — S0181XA Laceration without foreign body of other part of head, initial encounter: Secondary | ICD-10-CM | POA: Insufficient documentation

## 2022-09-20 DIAGNOSIS — Z79899 Other long term (current) drug therapy: Secondary | ICD-10-CM | POA: Diagnosis not present

## 2022-09-20 DIAGNOSIS — I10 Essential (primary) hypertension: Secondary | ICD-10-CM | POA: Insufficient documentation

## 2022-09-20 DIAGNOSIS — Z7984 Long term (current) use of oral hypoglycemic drugs: Secondary | ICD-10-CM | POA: Diagnosis not present

## 2022-09-20 DIAGNOSIS — W540XXA Bitten by dog, initial encounter: Secondary | ICD-10-CM | POA: Insufficient documentation

## 2022-09-20 DIAGNOSIS — S5780XA Crushing injury of unspecified forearm, initial encounter: Secondary | ICD-10-CM | POA: Diagnosis not present

## 2022-09-20 DIAGNOSIS — S41112A Laceration without foreign body of left upper arm, initial encounter: Secondary | ICD-10-CM | POA: Diagnosis not present

## 2022-09-20 DIAGNOSIS — Z7982 Long term (current) use of aspirin: Secondary | ICD-10-CM | POA: Insufficient documentation

## 2022-09-20 DIAGNOSIS — Z743 Need for continuous supervision: Secondary | ICD-10-CM | POA: Diagnosis not present

## 2022-09-20 DIAGNOSIS — Z203 Contact with and (suspected) exposure to rabies: Secondary | ICD-10-CM | POA: Diagnosis not present

## 2022-09-20 DIAGNOSIS — E119 Type 2 diabetes mellitus without complications: Secondary | ICD-10-CM | POA: Diagnosis not present

## 2022-09-20 DIAGNOSIS — Z2914 Encounter for prophylactic rabies immune globin: Secondary | ICD-10-CM | POA: Diagnosis not present

## 2022-09-20 DIAGNOSIS — W5581XA Bitten by other mammals, initial encounter: Secondary | ICD-10-CM | POA: Diagnosis not present

## 2022-09-20 DIAGNOSIS — S50871A Other superficial bite of right forearm, initial encounter: Secondary | ICD-10-CM | POA: Diagnosis not present

## 2022-09-20 DIAGNOSIS — S51832A Puncture wound without foreign body of left forearm, initial encounter: Secondary | ICD-10-CM | POA: Insufficient documentation

## 2022-09-20 DIAGNOSIS — Z23 Encounter for immunization: Secondary | ICD-10-CM | POA: Diagnosis not present

## 2022-09-20 DIAGNOSIS — M79603 Pain in arm, unspecified: Secondary | ICD-10-CM | POA: Diagnosis not present

## 2022-09-20 MED ORDER — RABIES IMMUNE GLOBULIN 150 UNIT/ML IM INJ
20.0000 [IU]/kg | INJECTION | Freq: Once | INTRAMUSCULAR | Status: AC
  Administered 2022-09-20: 1800 [IU] via INTRAMUSCULAR
  Filled 2022-09-20: qty 12

## 2022-09-20 MED ORDER — AMOXICILLIN-POT CLAVULANATE 875-125 MG PO TABS
1.0000 | ORAL_TABLET | Freq: Once | ORAL | Status: AC
  Administered 2022-09-20: 1 via ORAL
  Filled 2022-09-20: qty 1

## 2022-09-20 MED ORDER — RABIES VACCINE, PCEC IM SUSR
1.0000 mL | Freq: Once | INTRAMUSCULAR | Status: AC
  Administered 2022-09-20: 1 mL via INTRAMUSCULAR
  Filled 2022-09-20: qty 1

## 2022-09-20 MED ORDER — AMOXICILLIN-POT CLAVULANATE 875-125 MG PO TABS: 1.0000 | ORAL_TABLET | Freq: Two times a day (BID) | ORAL | 0 refills | Status: DC

## 2022-09-20 MED ORDER — TETANUS-DIPHTH-ACELL PERTUSSIS 5-2.5-18.5 LF-MCG/0.5 IM SUSY
0.5000 mL | PREFILLED_SYRINGE | Freq: Once | INTRAMUSCULAR | Status: AC
  Administered 2022-09-20: 0.5 mL via INTRAMUSCULAR
  Filled 2022-09-20: qty 0.5

## 2022-09-20 MED ORDER — IBUPROFEN 800 MG PO TABS
800.0000 mg | ORAL_TABLET | Freq: Once | ORAL | Status: AC
  Administered 2022-09-20: 800 mg via ORAL
  Filled 2022-09-20: qty 1
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
## 2022-09-20 NOTE — Discharge Instructions (Addendum)
You were seen in the emergency room today for dog bite.  You were given tetanus vaccination as well as rabies vaccination.  Please contact and visit the following locations:   [facility] [address] [place], [place] [address]     or [facility] [address], [place], [place] [address] [phone] To complete rabies vaccination.   You also received 1 dose of antibiotic here today.  Please go to pharmacy and pick up remaining antibiotics.    Please return to the ER if symptoms continue or worsen

## 2022-09-20 NOTE — ED Notes (Signed)
Notified [facility] for rabies vaccines.
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
## 2022-10-02 ENCOUNTER — Telehealth: Payer: Self-pay

## 2022-10-02 NOTE — Telephone Encounter (Signed)
Transition Care Management Follow-up Telephone Call Date of discharge and from where: Frank Moses 8/24 How have you been since you were released from the hospital? Doing ok PT will be following up with PCP Any questions or concerns? No  Items Reviewed: Did the pt receive and understand the discharge instructions provided? Yes  Medications obtained and verified? Yes  Other? No  Any new allergies since your discharge? No  Dietary orders reviewed? No Do you have support at home? Yes     Follow up appointments reviewed:  PCP Hospital f/u appt confirmed? Yes  Scheduled to see PCP on UNSURE OF DATE @ . Specialist Hospital f/u appt confirmed? No  Scheduled to see  on  @ . Are transportation arrangements needed? No  If their condition worsens, is the pt aware to call PCP or go to the Emergency Dept.? Yes Was the patient provided with contact information for the PCP's office or ED? Yes Was to pt encouraged to call back with questions or concerns? Yes

## 2022-10-06 ENCOUNTER — Encounter: Payer: Self-pay | Admitting: Family

## 2022-10-06 ENCOUNTER — Ambulatory Visit (INDEPENDENT_AMBULATORY_CARE_PROVIDER_SITE_OTHER): Payer: 59 | Admitting: Family

## 2022-10-06 VITALS — BP 134/66 | HR 100 | Temp 97.1°F | Ht 74.0 in | Wt 189.8 lb

## 2022-10-06 DIAGNOSIS — E1169 Type 2 diabetes mellitus with other specified complication: Secondary | ICD-10-CM

## 2022-10-06 DIAGNOSIS — E1159 Type 2 diabetes mellitus with other circulatory complications: Secondary | ICD-10-CM

## 2022-10-06 DIAGNOSIS — I499 Cardiac arrhythmia, unspecified: Secondary | ICD-10-CM

## 2022-10-06 DIAGNOSIS — F3341 Major depressive disorder, recurrent, in partial remission: Secondary | ICD-10-CM

## 2022-10-06 DIAGNOSIS — K219 Gastro-esophageal reflux disease without esophagitis: Secondary | ICD-10-CM | POA: Diagnosis not present

## 2022-10-06 DIAGNOSIS — Z0001 Encounter for general adult medical examination with abnormal findings: Secondary | ICD-10-CM

## 2022-10-06 DIAGNOSIS — I493 Ventricular premature depolarization: Secondary | ICD-10-CM

## 2022-10-06 DIAGNOSIS — Z0000 Encounter for general adult medical examination without abnormal findings: Secondary | ICD-10-CM | POA: Diagnosis not present

## 2022-10-06 DIAGNOSIS — F17210 Nicotine dependence, cigarettes, uncomplicated: Secondary | ICD-10-CM

## 2022-10-06 DIAGNOSIS — M1A0720 Idiopathic chronic gout, left ankle and foot, without tophus (tophi): Secondary | ICD-10-CM

## 2022-10-06 DIAGNOSIS — F1020 Alcohol dependence, uncomplicated: Secondary | ICD-10-CM

## 2022-10-06 DIAGNOSIS — F251 Schizoaffective disorder, depressive type: Secondary | ICD-10-CM | POA: Diagnosis not present

## 2022-10-06 DIAGNOSIS — I152 Hypertension secondary to endocrine disorders: Secondary | ICD-10-CM | POA: Diagnosis not present

## 2022-10-06 LAB — BAYER DCA HB A1C WAIVED: HB A1C (BAYER DCA - WAIVED): 7.9 % — ABNORMAL HIGH (ref 4.8–5.6)

## 2022-10-06 LAB — HM DIABETES EYE EXAM

## 2022-10-06 NOTE — Progress Notes (Signed)
Subjective:    Patient ID: Frank Moses, male    DOB: Apr 04, 1965, 57 y.o.   MRN: [id number]  Chief Complaint  Patient presents with   Medical Management of Chronic Issues    Patient complains of not eating good    Hospitalization Follow-up   PT presents to the office today for CPE and chronic follow up. He is followed by [facility] every 6 month for PTSD, depression, and Schizoaffective.    Reports drinking 18 beers a day most days of the week. He is trying to quit.  Has had three DUI's in the past, but trying to get his license back. He reports he trying to cut back.    He has gout and takes allopurinol 300 mg. States his last flare up was over a year ago. Hypertension This is a chronic problem. The current episode started more than 1 year ago. The problem has been resolved since onset. The problem is controlled. Associated symptoms include malaise/fatigue. Pertinent negatives include no blurred vision, peripheral edema or shortness of breath. Risk factors for coronary artery disease include dyslipidemia, male gender, sedentary lifestyle and smoking/tobacco exposure. The current treatment provides moderate improvement.  Diabetes He presents for his follow-up diabetic visit. He has type 2 diabetes mellitus. Associated symptoms include fatigue. Pertinent negatives for diabetes include no blurred vision and no foot paresthesias. Symptoms are stable. Risk factors for coronary artery disease include dyslipidemia, diabetes mellitus, hypertension, sedentary lifestyle and post-menopausal. He is following a generally unhealthy diet. (Does not check glucose at home) Eye exam is not current.  Gastroesophageal Reflux He complains of belching, heartburn and a hoarse voice. This is a chronic problem. The current episode started more than 1 year ago. The problem occurs occasionally. Associated symptoms include fatigue. He has tried a PPI for the symptoms. The treatment provided moderate relief.   Hyperlipidemia This is a chronic problem. The current episode started more than 1 year ago. Pertinent negatives include no shortness of breath. Current antihyperlipidemic treatment includes statins. The current treatment provides moderate improvement of lipids. Risk factors for coronary artery disease include dyslipidemia, diabetes mellitus, hypertension, a sedentary lifestyle and post-menopausal.  Depression        This is a chronic problem.  The current episode started more than 1 year ago.   The problem occurs intermittently.  Associated symptoms include fatigue and sad.  Associated symptoms include no helplessness and no hopelessness.  Past treatments include SNRIs - Serotonin and norepinephrine reuptake inhibitors. Nicotine Dependence Presents for follow-up visit. Symptoms include fatigue. His urge triggers include company of smokers. The symptoms have been stable. He smokes < 1/2 a pack of cigarettes per day.      Review of Systems  Constitutional:  Positive for fatigue and malaise/fatigue.  HENT:  Positive for hoarse voice.   Eyes:  Negative for blurred vision.  Respiratory:  Negative for shortness of breath.   Gastrointestinal:  Positive for heartburn.  Psychiatric/Behavioral:  Positive for depression.   All other systems reviewed and are negative.  Family History  Problem Relation Age of Onset   Alcoholism Father    Cancer Father        unknown    Social History   Socioeconomic History   Marital status: Single    Spouse name: Not on file   Number of children: 2   Years of education: Not on file   Highest education level: Not on file  Occupational History   Not on file  Tobacco Use  Smoking status: Every Day    Current packs/day: 0.50    Types: Cigarettes   Smokeless tobacco: Never  Vaping Use   Vaping status: Former  Substance and Sexual Activity   Alcohol use: Yes    Comment: 3-6 cans of beer every 3-4 days   Drug use: Never   Sexual activity: Yes    Birth  control/protection: None  Other Topics Concern   Not on file  Social History Narrative   ** Merged History Encounter **    Single.   2 daughters.   4 grandchildren, ages 38-10 in 2023.   Social Determinants of Health   Financial Resource Strain: Low Risk  (04/02/2022)   Overall Financial Resource Strain (CARDIA)    Difficulty of Paying Living Expenses: Not hard at all  Food Insecurity: No Food Insecurity (04/02/2022)   Hunger Vital Sign    Worried About Running Out of Food in the Last Year: Never true    Ran Out of Food in the Last Year: Never true  Transportation Needs: No Transportation Needs (04/02/2022)   PRAPARE - Administrator, Civil Service (Medical): No    Lack of Transportation (Non-Medical): No  Physical Activity: Sufficiently Active (04/02/2022)   Exercise Vital Sign    Days of Exercise per Week: 5 days    Minutes of Exercise per Session: 30 min  Stress: No Stress Concern Present (04/02/2022)   [employer] of Occupational Health - Occupational Stress Questionnaire    Feeling of Stress : Not at all  Social Connections: Moderately Integrated (04/02/2022)   Social Connection and Isolation Panel [NHANES]    Frequency of Communication with Friends and Family: More than three times a week    Frequency of Social Gatherings with Friends and Family: More than three times a week    Attends Religious Services: 1 to 4 times per year    Active Member of [employer] or Organizations: Yes    Attends Banker Meetings: 1 to 4 times per year    Marital Status: Never married       Objective:   Physical Exam Vitals reviewed.  Constitutional:      General: He is not in acute distress.    Appearance: He is well-developed.     Comments: sweating  HENT:     Head: Normocephalic.     Right Ear: Tympanic membrane normal.     Left Ear: Tympanic membrane normal.  Eyes:     General:        Right eye: No discharge.        Left eye: No discharge.     Pupils: Pupils are  equal, round, and reactive to light.  Neck:     Thyroid: No thyromegaly.  Cardiovascular:     Rate and Rhythm: Tachycardia present. Rhythm irregular.     Heart sounds: Normal heart sounds. No murmur heard. Pulmonary:     Effort: Pulmonary effort is normal. No respiratory distress.     Breath sounds: Normal breath sounds. No wheezing.  Abdominal:     General: Bowel sounds are normal. There is no distension.     Palpations: Abdomen is soft.     Tenderness: There is no abdominal tenderness.  Musculoskeletal:        General: No tenderness. Normal range of motion.     Cervical back: Normal range of motion and neck supple.  Skin:    General: Skin is warm and dry.     Findings: No erythema  or rash.  Neurological:     Mental Status: He is alert and oriented to person, place, and time.     Cranial Nerves: No cranial nerve deficit.     Deep Tendon Reflexes: Reflexes are normal and symmetric.  Psychiatric:        Behavior: Behavior normal.        Thought Content: Thought content normal.        Judgment: Judgment normal.       BP 134/66   Pulse 100   Temp (!) 97.1 F (36.2 C) (Temporal)   Ht 6\' 2"  (1.88 m)   Wt 189 lb 12.8 oz (86.1 kg)   SpO2 95%   BMI 24.37 kg/m      Assessment & Plan:  STINSON RASER comes in today with chief complaint of Medical Management of Chronic Issues (Patient complains of not eating good ) and Hospitalization Follow-up   Diagnosis and orders addressed:  1. Annual physical exam - Bayer DCA Hb A1c Waived - CBC with Differential/Platelet - CMP14+EGFR - Lipid panel - TSH - EKG 12-Lead  2. Schizoaffective disorder, depressive type (HCC) - CBC with Differential/Platelet - CMP14+EGFR  3. Recurrent major depressive disorder, in partial remission (HCC) - CBC with Differential/Platelet - CMP14+EGFR  4. Cigarette nicotine dependence without complication - CBC with Differential/Platelet - CMP14+EGFR  5. Hypertension associated with type 2  diabetes mellitus (HCC) - CBC with Differential/Platelet - CMP14+EGFR  6. Hyperlipidemia associated with type 2 diabetes mellitus (HCC) - CBC with Differential/Platelet - CMP14+EGFR - Lipid panel  7. Gastroesophageal reflux disease without esophagitis - CBC with Differential/Platelet - CMP14+EGFR  8. Type 2 diabetes mellitus with other specified complication, without long-term current use of insulin (HCC) - Bayer DCA Hb A1c Waived - CBC with Differential/Platelet - CMP14+EGFR  9. Chronic idiopathic gout involving toe of left foot without tophus - CBC with Differential/Platelet - CMP14+EGFR  10. Alcohol use disorder, severe, dependence (HCC) - CBC with Differential/Platelet - CMP14+EGFR  11. Irregular heartbeat - CMP14+EGFR - EKG 12-Lead  12. PVC (premature ventricular contraction) Limit alcohol, caffeine, and stress     Labs pending Continue current medications  Health Maintenance reviewed Diet and exercise encouraged  Follow up plan: 4 months   Jannifer Rodney, FNP

## 2022-10-06 NOTE — Patient Instructions (Signed)
Health Maintenance, Male Adopting a healthy lifestyle and getting preventive care are important in promoting health and wellness. Ask your health care provider about: The right schedule for you to have regular tests and exams. Things you can do on your own to prevent diseases and keep yourself healthy. What should I know about diet, weight, and exercise? Eat a healthy diet  Eat a diet that includes plenty of vegetables, fruits, low-fat dairy products, and lean protein. Do not eat a lot of foods that are high in solid fats, added sugars, or sodium. Maintain a healthy weight Body mass index (BMI) is a measurement that can be used to identify possible weight problems. It estimates body fat based on height and weight. Your health care provider can help determine your BMI and help you achieve or maintain a healthy weight. Get regular exercise Get regular exercise. This is one of the most important things you can do for your health. Most adults should: Exercise for at least 150 minutes each week. The exercise should increase your heart rate and make you sweat (moderate-intensity exercise). Do strengthening exercises at least twice a week. This is in addition to the moderate-intensity exercise. Spend less time sitting. Even light physical activity can be beneficial. Watch cholesterol and blood lipids Have your blood tested for lipids and cholesterol at 57 years of age, then have this test every 5 years. You may need to have your cholesterol levels checked more often if: Your lipid or cholesterol levels are high. You are older than 57 years of age. You are at high risk for heart disease. What should I know about cancer screening? Many types of cancers can be detected early and may often be prevented. Depending on your health history and family history, you may need to have cancer screening at various ages. This may include screening for: Colorectal cancer. Prostate cancer. Skin cancer. Lung  cancer. What should I know about heart disease, diabetes, and high blood pressure? Blood pressure and heart disease High blood pressure causes heart disease and increases the risk of stroke. This is more likely to develop in people who have high blood pressure readings or are overweight. Talk with your health care provider about your target blood pressure readings. Have your blood pressure checked: Every 3-5 years if you are 18-39 years of age. Every year if you are 40 years old or older. If you are between the ages of 65 and 75 and are a current or former smoker, ask your health care provider if you should have a one-time screening for abdominal aortic aneurysm (AAA). Diabetes Have regular diabetes screenings. This checks your fasting blood sugar level. Have the screening done: Once every three years after age 45 if you are at a normal weight and have a low risk for diabetes. More often and at a younger age if you are overweight or have a high risk for diabetes. What should I know about preventing infection? Hepatitis B If you have a higher risk for hepatitis B, you should be screened for this virus. Talk with your health care provider to find out if you are at risk for hepatitis B infection. Hepatitis C Blood testing is recommended for: Everyone born from 1945 through 1965. Anyone with known risk factors for hepatitis C. Sexually transmitted infections (STIs) You should be screened each year for STIs, including gonorrhea and chlamydia, if: You are sexually active and are younger than 57 years of age. You are older than 57 years of age and your   health care provider tells you that you are at risk for this type of infection. Your sexual activity has changed since you were last screened, and you are at increased risk for chlamydia or gonorrhea. Ask your health care provider if you are at risk. Ask your health care provider about whether you are at high risk for HIV. Your health care provider  may recommend a prescription medicine to help prevent HIV infection. If you choose to take medicine to prevent HIV, you should first get tested for HIV. You should then be tested every 3 months for as long as you are taking the medicine. Follow these instructions at home: Alcohol use Do not drink alcohol if your health care provider tells you not to drink. If you drink alcohol: Limit how much you have to 0-2 drinks a day. Know how much alcohol is in your drink. In the [place] one drink equals one 12 oz bottle of beer (355 mL), one 5 oz glass of wine (148 mL), or one 1 oz glass of hard liquor (44 mL). Lifestyle Do not use any products that contain nicotine or tobacco. These products include cigarettes, chewing tobacco, and vaping devices, such as e-cigarettes. If you need help quitting, ask your health care provider. Do not use street drugs. Do not share needles. Ask your health care provider for help if you need support or information about quitting drugs. General instructions Schedule regular health, dental, and eye exams. Stay current with your vaccines. Tell your health care provider if: You often feel depressed. You have ever been abused or do not feel safe at home. Summary Adopting a healthy lifestyle and getting preventive care are important in promoting health and wellness. Follow your health care provider's instructions about healthy diet, exercising, and getting tested or screened for diseases. Follow your health care provider's instructions on monitoring your cholesterol and blood pressure. This information is not intended to replace advice given to you by your health care provider. Make sure you discuss any questions you have with your health care provider. Document Revised: 06/04/2020 Document Reviewed: 06/04/2020 Elsevier Patient Education  2024 Elsevier Inc.  

## 2022-10-07 ENCOUNTER — Other Ambulatory Visit: Payer: Self-pay | Admitting: Family

## 2022-10-07 LAB — CBC WITH DIFFERENTIAL/PLATELET
Basophils Absolute: 0.1 10*3/uL (ref 0.0–0.2)
Basos: 1 %
EOS (ABSOLUTE): 0.2 10*3/uL (ref 0.0–0.4)
Eos: 3 %
Hematocrit: 42.9 % (ref 37.5–51.0)
Hemoglobin: 14.5 g/dL (ref 13.0–17.7)
Immature Grans (Abs): 0 10*3/uL (ref 0.0–0.1)
Immature Granulocytes: 0 %
Lymphocytes Absolute: 2.1 10*3/uL (ref 0.7–3.1)
Lymphs: 35 %
MCH: 32 pg (ref 26.6–33.0)
MCHC: 33.8 g/dL (ref 31.5–35.7)
MCV: 95 fL (ref 79–97)
Monocytes Absolute: 0.6 10*3/uL (ref 0.1–0.9)
Monocytes: 10 %
Neutrophils Absolute: 3 10*3/uL (ref 1.4–7.0)
Neutrophils: 51 %
Platelets: 221 10*3/uL (ref 150–450)
RBC: 4.53 x10E6/uL (ref 4.14–5.80)
RDW: 13.6 % (ref 11.6–15.4)
WBC: 5.8 10*3/uL (ref 3.4–10.8)

## 2022-10-07 LAB — CMP14+EGFR
ALT: 42 IU/L (ref 0–44)
AST: 43 IU/L — ABNORMAL HIGH (ref 0–40)
Albumin: 4.3 g/dL (ref 3.8–4.9)
Alkaline Phosphatase: 113 IU/L (ref 44–121)
BUN/Creatinine Ratio: 11 (ref 9–20)
BUN: 10 mg/dL (ref 6–24)
Bilirubin Total: 0.4 mg/dL (ref 0.0–1.2)
CO2: 22 mmol/L (ref 20–29)
Calcium: 9.5 mg/dL (ref 8.7–10.2)
Chloride: 105 mmol/L (ref 96–106)
Creatinine, Ser: 0.91 mg/dL (ref 0.76–1.27)
Globulin, Total: 2.9 g/dL (ref 1.5–4.5)
Glucose: 179 mg/dL — ABNORMAL HIGH (ref 70–99)
Potassium: 4.1 mmol/L (ref 3.5–5.2)
Sodium: 143 mmol/L (ref 134–144)
Total Protein: 7.2 g/dL (ref 6.0–8.5)
eGFR: 98 mL/min/{1.73_m2} (ref >59)

## 2022-10-07 LAB — LIPID PANEL
Chol/HDL Ratio: 4.3 ratio (ref 0.0–5.0)
Cholesterol, Total: 178 mg/dL (ref 100–199)
HDL: 41 mg/dL (ref >39)
LDL Chol Calc (NIH): 49 mg/dL (ref 0–99)
Triglycerides: 607 mg/dL (ref 0–149)
VLDL Cholesterol Cal: 88 mg/dL — ABNORMAL HIGH (ref 5–40)

## 2022-10-07 LAB — TSH: TSH: 1.11 u[IU]/mL (ref 0.450–4.500)

## 2022-10-07 MED ORDER — METFORMIN HCL ER (MOD) 500 MG PO TB24: 1000.0000 mg | ORAL_TABLET | Freq: Two times a day (BID) | ORAL | 1 refills | Status: DC

## 2022-10-08 NOTE — Telephone Encounter (Signed)
  metFORMIN (GLUCOPHAGE-XR) 500 MG 24 hr tablet    Pharmacy comment: Alternative Requested:DRUG NOT COVERED; PLEASE CONTACT INSURANCE OR CONSIDER ALTERNATIVE.

## 2022-10-15 ENCOUNTER — Encounter: Payer: Self-pay | Admitting: Family Medicine

## 2022-10-27 ENCOUNTER — Other Ambulatory Visit: Payer: Self-pay | Admitting: Family

## 2022-10-27 DIAGNOSIS — K219 Gastro-esophageal reflux disease without esophagitis: Secondary | ICD-10-CM

## 2022-11-21 ENCOUNTER — Other Ambulatory Visit: Payer: Self-pay | Admitting: Family

## 2022-11-21 DIAGNOSIS — M1A0720 Idiopathic chronic gout, left ankle and foot, without tophus (tophi): Secondary | ICD-10-CM

## 2022-11-21 DIAGNOSIS — E1169 Type 2 diabetes mellitus with other specified complication: Secondary | ICD-10-CM

## 2022-11-25 ENCOUNTER — Other Ambulatory Visit: Payer: Self-pay | Admitting: Family

## 2022-11-25 DIAGNOSIS — E1169 Type 2 diabetes mellitus with other specified complication: Secondary | ICD-10-CM

## 2022-11-25 DIAGNOSIS — M1A0720 Idiopathic chronic gout, left ankle and foot, without tophus (tophi): Secondary | ICD-10-CM

## 2022-12-08 ENCOUNTER — Telehealth: Payer: Self-pay | Admitting: Family

## 2023-01-23 ENCOUNTER — Other Ambulatory Visit: Payer: Self-pay | Admitting: Family

## 2023-01-23 DIAGNOSIS — E1159 Type 2 diabetes mellitus with other circulatory complications: Secondary | ICD-10-CM

## 2023-02-05 ENCOUNTER — Ambulatory Visit (INDEPENDENT_AMBULATORY_CARE_PROVIDER_SITE_OTHER): Payer: 59 | Admitting: Family

## 2023-02-05 ENCOUNTER — Encounter: Payer: Self-pay | Admitting: Family

## 2023-02-05 VITALS — BP 136/88 | HR 85 | Temp 97.1°F | Ht 74.0 in | Wt 196.0 lb

## 2023-02-05 DIAGNOSIS — E1169 Type 2 diabetes mellitus with other specified complication: Secondary | ICD-10-CM

## 2023-02-05 DIAGNOSIS — K219 Gastro-esophageal reflux disease without esophagitis: Secondary | ICD-10-CM | POA: Diagnosis not present

## 2023-02-05 DIAGNOSIS — E1159 Type 2 diabetes mellitus with other circulatory complications: Secondary | ICD-10-CM

## 2023-02-05 DIAGNOSIS — F333 Major depressive disorder, recurrent, severe with psychotic symptoms: Secondary | ICD-10-CM

## 2023-02-05 DIAGNOSIS — M1A0720 Idiopathic chronic gout, left ankle and foot, without tophus (tophi): Secondary | ICD-10-CM | POA: Diagnosis not present

## 2023-02-05 DIAGNOSIS — F3341 Major depressive disorder, recurrent, in partial remission: Secondary | ICD-10-CM

## 2023-02-05 DIAGNOSIS — E785 Hyperlipidemia, unspecified: Secondary | ICD-10-CM

## 2023-02-05 DIAGNOSIS — F251 Schizoaffective disorder, depressive type: Secondary | ICD-10-CM

## 2023-02-05 DIAGNOSIS — I152 Hypertension secondary to endocrine disorders: Secondary | ICD-10-CM | POA: Diagnosis not present

## 2023-02-05 DIAGNOSIS — H6991 Unspecified Eustachian tube disorder, right ear: Secondary | ICD-10-CM

## 2023-02-05 DIAGNOSIS — F1020 Alcohol dependence, uncomplicated: Secondary | ICD-10-CM

## 2023-02-05 LAB — BAYER DCA HB A1C WAIVED: HB A1C (BAYER DCA - WAIVED): 6.9 % — ABNORMAL HIGH (ref 4.8–5.6)

## 2023-02-05 MED ORDER — METFORMIN HCL 500 MG PO TABS
500.0000 mg | ORAL_TABLET | Freq: Two times a day (BID) | ORAL | 3 refills | Status: AC
Start: 2023-02-05 — End: ?

## 2023-02-05 MED ORDER — LOSARTAN POTASSIUM 100 MG PO TABS
100.0000 mg | ORAL_TABLET | Freq: Every day | ORAL | 0 refills | Status: DC
Start: 2023-02-05 — End: 2023-07-28

## 2023-02-05 MED ORDER — VENLAFAXINE HCL ER 75 MG PO CP24: 225.0000 mg | ORAL_CAPSULE | Freq: Every day | ORAL | 0 refills | Status: AC

## 2023-02-05 MED ORDER — ALLOPURINOL 300 MG PO TABS
300.0000 mg | ORAL_TABLET | Freq: Every day | ORAL | 0 refills | Status: DC
Start: 2023-02-05 — End: 2023-05-25

## 2023-02-05 MED ORDER — CETIRIZINE HCL 10 MG PO TABS
10.0000 mg | ORAL_TABLET | Freq: Every day | ORAL | 1 refills | Status: AC
Start: 2023-02-05 — End: ?

## 2023-02-05 MED ORDER — ATORVASTATIN CALCIUM 20 MG PO TABS
20.0000 mg | ORAL_TABLET | Freq: Every day | ORAL | 0 refills | Status: DC
Start: 2023-02-05 — End: 2023-02-23

## 2023-02-05 MED ORDER — PANTOPRAZOLE SODIUM 40 MG PO TBEC: 40.0000 mg | DELAYED_RELEASE_TABLET | Freq: Every day | ORAL | 1 refills | Status: DC

## 2023-02-05 MED ORDER — AMLODIPINE BESYLATE 10 MG PO TABS: 10.0000 mg | ORAL_TABLET | Freq: Every day | ORAL | 0 refills | Status: DC

## 2023-02-05 MED ORDER — FLUTICASONE PROPIONATE 50 MCG/ACT NA SUSP
2.0000 | Freq: Every day | NASAL | 6 refills | Status: AC
Start: 2023-02-05 — End: ?

## 2023-02-05 NOTE — Progress Notes (Signed)
 Subjective:    Patient ID: Frank Moses, male    DOB: 01/09/1966, 58 y.o.   MRN: [id number]  Chief Complaint  Patient presents with   Medical Management of Chronic Issues   PT presents to the office today for chronic follow up. He is followed by [facility] every 6 month for PTSD, depression, and Schizoaffective.    Reports drinking 6-7 beers a day most days of the week. He is trying to quit.  Has had three DUI's in the past, but trying to get his license back. He reports he trying to cut back.    He has gout and takes allopurinol  300 mg. States his last flare up was over a year ago. Hypertension This is a chronic problem. The current episode started more than 1 year ago. The problem has been resolved since onset. The problem is controlled. Associated symptoms include malaise/fatigue. Pertinent negatives include no blurred vision, peripheral edema or shortness of breath. Risk factors for coronary artery disease include dyslipidemia, male gender, sedentary lifestyle and smoking/tobacco exposure. The current treatment provides moderate improvement.  Diabetes He presents for his follow-up diabetic visit. He has type 2 diabetes mellitus. Associated symptoms include fatigue and foot paresthesias. Pertinent negatives for diabetes include no blurred vision. Symptoms are stable. Diabetic complications include peripheral neuropathy. Risk factors for coronary artery disease include dyslipidemia, diabetes mellitus, hypertension, sedentary lifestyle and post-menopausal. He is following a generally unhealthy diet. (Does not check glucose at home) Eye exam is current.  Gastroesophageal Reflux He complains of belching, heartburn and a hoarse voice. This is a chronic problem. The current episode started more than 1 year ago. The problem occurs occasionally. Associated symptoms include fatigue. Risk factors include smoking/tobacco exposure. He has tried a PPI for the symptoms. The treatment provided  moderate relief.  Hyperlipidemia This is a chronic problem. The current episode started more than 1 year ago. Pertinent negatives include no shortness of breath. Current antihyperlipidemic treatment includes statins. The current treatment provides moderate improvement of lipids. Risk factors for coronary artery disease include dyslipidemia, diabetes mellitus, hypertension, a sedentary lifestyle and post-menopausal.  Depression        This is a chronic problem.  The current episode started more than 1 year ago.   The problem occurs intermittently.  Associated symptoms include fatigue, decreased interest and sad.  Associated symptoms include no helplessness and no hopelessness.  Past treatments include SNRIs - Serotonin and norepinephrine reuptake inhibitors. Nicotine  Dependence Presents for follow-up visit. Symptoms include fatigue. The symptoms have been stable. He smokes < 1/2 a pack of cigarettes per day.  Ear Fullness  There is pain in the right ear. This is a recurrent problem. The current episode started more than 1 month ago. The patient is experiencing no pain.      Review of Systems  Constitutional:  Positive for fatigue and malaise/fatigue.  HENT:  Positive for hoarse voice.   Eyes:  Negative for blurred vision.  Respiratory:  Negative for shortness of breath.   Gastrointestinal:  Positive for heartburn.  All other systems reviewed and are negative.  Family History  Problem Relation Age of Onset   Alcoholism Father    Cancer Father        unknown    Social History   Socioeconomic History   Marital status: Single    Spouse name: Not on file   Number of children: 2   Years of education: Not on file   Highest education level: Not on file  Occupational History   Not on file  Tobacco Use   Smoking status: Every Day    Current packs/day: 0.50    Types: Cigarettes   Smokeless tobacco: Never  Vaping Use   Vaping status: Former  Substance and Sexual Activity   Alcohol  use: Yes    Comment: 3-6 cans of beer every 3-4 days   Drug use: Never   Sexual activity: Yes    Birth control/protection: None  Other Topics Concern   Not on file  Social History Narrative   ** Merged History Encounter **    Single.   2 daughters.   4 grandchildren, ages 46-10 in 2023.   Social Drivers of Corporate Investment Banker Strain: Low Risk  (04/02/2022)   Overall Financial Resource Strain (CARDIA)    Difficulty of Paying Living Expenses: Not hard at all  Food Insecurity: No Food Insecurity (04/02/2022)   Hunger Vital Sign    Worried About Running Out of Food in the Last Year: Never true    Ran Out of Food in the Last Year: Never true  Transportation Needs: No Transportation Needs (04/02/2022)   PRAPARE - Administrator, Civil Service (Medical): No    Lack of Transportation (Non-Medical): No  Physical Activity: Sufficiently Active (04/02/2022)   Exercise Vital Sign    Days of Exercise per Week: 5 days    Minutes of Exercise per Session: 30 min  Stress: No Stress Concern Present (04/02/2022)   [employer] of Occupational Health - Occupational Stress Questionnaire    Feeling of Stress : Not at all  Social Connections: Moderately Integrated (04/02/2022)   Social Connection and Isolation Panel [NHANES]    Frequency of Communication with Friends and Family: More than three times a week    Frequency of Social Gatherings with Friends and Family: More than three times a week    Attends Religious Services: 1 to 4 times per year    Active Member of [employer] or Organizations: Yes    Attends Banker Meetings: 1 to 4 times per year    Marital Status: Never married       Objective:   Physical Exam Vitals reviewed.  Constitutional:      General: He is not in acute distress.    Appearance: He is well-developed.     Comments: sweating  HENT:     Head: Normocephalic.     Right Ear: A middle ear effusion is present.     Left Ear: Tympanic membrane normal.   Eyes:     General:        Right eye: No discharge.        Left eye: No discharge.     Pupils: Pupils are equal, round, and reactive to light.  Neck:     Thyroid : No thyromegaly.  Cardiovascular:     Rate and Rhythm: Tachycardia present. Rhythm irregular.     Heart sounds: Normal heart sounds. No murmur heard. Pulmonary:     Effort: Pulmonary effort is normal. No respiratory distress.     Breath sounds: Normal breath sounds. No wheezing.  Abdominal:     General: Bowel sounds are normal. There is no distension.     Palpations: Abdomen is soft.     Tenderness: There is no abdominal tenderness.  Musculoskeletal:        General: No tenderness. Normal range of motion.     Cervical back: Normal range of motion and neck supple.  Skin:  General: Skin is warm and dry.     Findings: No erythema or rash.  Neurological:     Mental Status: He is alert and oriented to person, place, and time.     Cranial Nerves: No cranial nerve deficit.     Deep Tendon Reflexes: Reflexes are normal and symmetric.  Psychiatric:        Behavior: Behavior normal.        Thought Content: Thought content normal.        Judgment: Judgment normal.       BP 136/88   Pulse 85   Temp (!) 97.1 F (36.2 C) (Temporal)   Ht 6' 2 (1.88 m)   Wt 196 lb (88.9 kg)   SpO2 95%   BMI 25.16 kg/m      Assessment & Plan:  Frank Moses comes in today with chief complaint of Medical Management of Chronic Issues   Diagnosis and orders addressed:  1. 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  Dispense: 90 tablet; Refill: 0  2. Hypertension associated with type 2 diabetes mellitus (HCC) - CMP14+EGFR - amLODipine  (NORVASC ) 10 MG tablet; Take 1 tablet (10 mg total) by mouth daily.  Dispense: 90 tablet; Refill: 0 - losartan  (COZAAR ) 100 MG tablet; Take 1 tablet (100 mg total) by mouth daily.   Dispense: 90 tablet; Refill: 0  3. Hyperlipidemia associated with type 2 diabetes mellitus (HCC) - CMP14+EGFR - atorvastatin  (LIPITOR) 20 MG tablet; Take 1 tablet (20 mg total) by mouth daily.  Dispense: 90 tablet; Refill: 0  4. Gastroesophageal reflux disease without esophagitis - CMP14+EGFR - pantoprazole  (PROTONIX ) 40 MG tablet; Take 1 tablet (40 mg total) by mouth daily.  Dispense: 90 tablet; Refill: 1  5. Recurrent major depressive disorder, in partial remission (HCC) - CMP14+EGFR - venlafaxine  XR (EFFEXOR -XR) 75 MG 24 hr capsule; Take 3 capsules (225 mg total) by mouth daily with breakfast.  Dispense: 90 capsule; Refill: 0  6. Schizoaffective disorder, depressive type (HCC) - CMP14+EGFR  7. MDD (major depressive disorder), recurrent, severe, with psychosis (HCC) - CMP14+EGFR  8. Alcohol use disorder, severe, dependence (HCC) - CMP14+EGFR  9. Type 2 diabetes mellitus with other specified complication, without long-term current use of insulin (HCC) (Primary) - Bayer DCA Hb A1c Waived - CMP14+EGFR - metFORMIN  (GLUCOPHAGE ) 500 MG tablet; Take 1 tablet (500 mg total) by mouth 2 (two) times daily with a meal.  Dispense: 180 tablet; Refill: 3  10. Eustachian tube dysfunction, right -Nasal decongestant  - fluticasone  (FLONASE ) 50 MCG/ACT nasal spray; Place 2 sprays into both nostrils daily.  Dispense: 16 g; Refill: 6 - cetirizine  (ZYRTEC  ALLERGY) 10 MG tablet; Take 1 tablet (10 mg total) by mouth daily.  Dispense: 90 tablet; Refill: 1    Labs pending Continue current medications  Health Maintenance reviewed Diet and exercise encouraged  Follow up plan: 4 months   Bari Learn, FNP

## 2023-02-05 NOTE — Patient Instructions (Signed)
 Eustachian Tube Dysfunction  Eustachian tube dysfunction refers to a condition in which a blockage develops in the narrow passage that connects the middle ear to the back of the nose (eustachian tube). The eustachian tube regulates air pressure in the middle ear by letting air move between the ear and nose. It also helps to drain fluid from the middle ear space. Eustachian tube dysfunction can affect one or both ears. When the eustachian tube does not function properly, air pressure, fluid, or both can build up in the middle ear. What are the causes? This condition occurs when the eustachian tube becomes blocked or cannot open normally. Common causes of this condition include: Ear infections. Colds and other infections that affect the nose, mouth, and throat (upper respiratory tract). Allergies. Irritation from cigarette smoke. Irritation from stomach acid coming up into the esophagus (gastroesophageal reflux). The esophagus is the part of the body that moves food from the mouth to the stomach. Sudden changes in air pressure, such as from descending in an airplane or scuba diving. Abnormal growths in the nose or throat, such as: Growths that line the nose (nasal polyps). Abnormal growth of cells (tumors). Enlarged tissue at the back of the throat (adenoids). What increases the risk? You are more likely to develop this condition if: You smoke. You are overweight. You are a child who has: Certain birth defects of the mouth, such as cleft palate. Large tonsils or adenoids. What are the signs or symptoms? Common symptoms of this condition include: A feeling of fullness in the ear. Ear pain. Clicking or popping noises in the ear. Ringing in the ear (tinnitus). Hearing loss. Loss of balance. Dizziness. Symptoms may get worse when the air pressure around you changes, such as when you travel to an area of high elevation, fly on an airplane, or go scuba diving. How is this diagnosed? This  condition may be diagnosed based on: Your symptoms. A physical exam of your ears, nose, and throat. Tests, such as those that measure: The movement of your eardrum. Your hearing (audiometry). How is this treated? Treatment depends on the cause and severity of your condition. In mild cases, you may relieve your symptoms by moving air into your ears. This is called "popping the ears." In more severe cases, or if you have symptoms of fluid in your ears, treatment may include: Medicines to relieve congestion (decongestants). Medicines that treat allergies (antihistamines). Nasal sprays or ear drops that contain medicines that reduce swelling (steroids). A procedure to drain the fluid in your eardrum. In this procedure, a small tube may be placed in the eardrum to: Drain the fluid. Restore the air in the middle ear space. A procedure to insert a balloon device through the nose to inflate the opening of the eustachian tube (balloon dilation). Follow these instructions at home: Lifestyle Do not do any of the following until your health care provider approves: Travel to high altitudes. Fly in airplanes. Work in a Estate agent or room. Scuba dive. Do not use any products that contain nicotine or tobacco. These products include cigarettes, chewing tobacco, and vaping devices, such as e-cigarettes. If you need help quitting, ask your health care provider. Keep your ears dry. Wear fitted earplugs during showering and bathing. Dry your ears completely after. General instructions Take over-the-counter and prescription medicines only as told by your health care provider. Use techniques to help pop your ears as recommended by your health care provider. These may include: Chewing gum. Yawning. Frequent, forceful swallowing.  Closing your mouth, holding your nose closed, and gently blowing as if you are trying to blow air out of your nose. Keep all follow-up visits. This is important. Contact a  health care provider if: Your symptoms do not go away after treatment. Your symptoms come back after treatment. You are unable to pop your ears. You have: A fever. Pain in your ear. Pain in your head or neck. Fluid draining from your ear. Your hearing suddenly changes. You become very dizzy. You lose your balance. Get help right away if: You have a sudden, severe increase in any of your symptoms. Summary Eustachian tube dysfunction refers to a condition in which a blockage develops in the eustachian tube. It can be caused by ear infections, allergies, inhaled irritants, or abnormal growths in the nose or throat. Symptoms may include ear pain or fullness, hearing loss, or ringing in the ears. Mild cases are treated with techniques to unblock the ears, such as yawning or chewing gum. More severe cases are treated with medicines or procedures. This information is not intended to replace advice given to you by your health care provider. Make sure you discuss any questions you have with your health care provider. Document Revised: 03/26/2020 Document Reviewed: 03/26/2020 Elsevier Patient Education  2024 [employer].

## 2023-02-06 LAB — CMP14+EGFR
ALT: 41 [IU]/L (ref 0–44)
AST: 44 [IU]/L — ABNORMAL HIGH (ref 0–40)
Albumin: 4.4 g/dL (ref 3.8–4.9)
Alkaline Phosphatase: 123 [IU]/L — ABNORMAL HIGH (ref 44–121)
BUN/Creatinine Ratio: 8 — ABNORMAL LOW (ref 9–20)
BUN: 7 mg/dL (ref 6–24)
Bilirubin Total: 0.5 mg/dL (ref 0.0–1.2)
CO2: 22 mmol/L (ref 20–29)
Calcium: 9.5 mg/dL (ref 8.7–10.2)
Chloride: 105 mmol/L (ref 96–106)
Creatinine, Ser: 0.87 mg/dL (ref 0.76–1.27)
Globulin, Total: 2.8 g/dL (ref 1.5–4.5)
Glucose: 144 mg/dL — ABNORMAL HIGH (ref 70–99)
Potassium: 4.5 mmol/L (ref 3.5–5.2)
Sodium: 143 mmol/L (ref 134–144)
Total Protein: 7.2 g/dL (ref 6.0–8.5)
eGFR: 101 mL/min/{1.73_m2} (ref >59)

## 2023-02-09 ENCOUNTER — Emergency Department (HOSPITAL_COMMUNITY): Payer: 59

## 2023-02-09 ENCOUNTER — Emergency Department (HOSPITAL_COMMUNITY)
Admission: EM | Admit: 2023-02-09 | Discharge: 2023-02-09 | Disposition: A | Discharge status: Home / Self Care | Payer: 59 | Attending: Emergency Medicine | Admitting: Emergency Medicine

## 2023-02-09 ENCOUNTER — Encounter (HOSPITAL_COMMUNITY): Payer: Self-pay | Admitting: Emergency Medicine

## 2023-02-09 DIAGNOSIS — S161XXA Strain of muscle, fascia and tendon at neck level, initial encounter: Secondary | ICD-10-CM | POA: Diagnosis not present

## 2023-02-09 DIAGNOSIS — M47812 Spondylosis without myelopathy or radiculopathy, cervical region: Secondary | ICD-10-CM | POA: Diagnosis not present

## 2023-02-09 DIAGNOSIS — I700 Atherosclerosis of aorta: Secondary | ICD-10-CM | POA: Diagnosis not present

## 2023-02-09 DIAGNOSIS — M47816 Spondylosis without myelopathy or radiculopathy, lumbar region: Secondary | ICD-10-CM | POA: Diagnosis not present

## 2023-02-09 DIAGNOSIS — J439 Emphysema, unspecified: Secondary | ICD-10-CM | POA: Diagnosis not present

## 2023-02-09 DIAGNOSIS — M51369 Other intervertebral disc degeneration, lumbar region without mention of lumbar back pain or lower extremity pain: Secondary | ICD-10-CM | POA: Diagnosis not present

## 2023-02-09 DIAGNOSIS — M4802 Spinal stenosis, cervical region: Secondary | ICD-10-CM | POA: Diagnosis not present

## 2023-02-09 DIAGNOSIS — S199XXA Unspecified injury of neck, initial encounter: Secondary | ICD-10-CM | POA: Diagnosis not present

## 2023-02-09 DIAGNOSIS — Z041 Encounter for examination and observation following transport accident: Secondary | ICD-10-CM | POA: Diagnosis not present

## 2023-02-09 DIAGNOSIS — Z981 Arthrodesis status: Secondary | ICD-10-CM | POA: Diagnosis not present

## 2023-02-09 DIAGNOSIS — Y92410 Unspecified street and highway as the place of occurrence of the external cause: Secondary | ICD-10-CM | POA: Diagnosis not present

## 2023-02-09 DIAGNOSIS — M542 Cervicalgia: Secondary | ICD-10-CM | POA: Diagnosis present

## 2023-02-09 DIAGNOSIS — Z7982 Long term (current) use of aspirin: Secondary | ICD-10-CM | POA: Insufficient documentation

## 2023-02-09 MED ORDER — CYCLOBENZAPRINE HCL 10 MG PO TABS
5.0000 mg | ORAL_TABLET | Freq: Once | ORAL | Status: AC
  Administered 2023-02-09: 5 mg via ORAL
  Filled 2023-02-09: qty 1

## 2023-02-09 MED ORDER — CYCLOBENZAPRINE HCL 5 MG PO TABS: 5.0000 mg | ORAL_TABLET | Freq: Three times a day (TID) | ORAL | 0 refills | Status: AC | PRN

## 2023-02-09 MED ORDER — OXYCODONE-ACETAMINOPHEN 5-325 MG PO TABS
1.0000 | ORAL_TABLET | Freq: Once | ORAL | Status: AC
  Administered 2023-02-09: 1 via ORAL
  Filled 2023-02-09: qty 1
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
## 2023-02-09 NOTE — ED Notes (Signed)
 Patient transported to CT

## 2023-02-09 NOTE — ED Notes (Signed)
 ED Provider at bedside.
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
## 2023-02-09 NOTE — ED Notes (Signed)
Patient discharged with family.

## 2023-02-09 NOTE — ED Notes (Signed)
 Pt returned from CT

## 2023-02-23 ENCOUNTER — Other Ambulatory Visit: Payer: Self-pay | Admitting: Family

## 2023-02-23 DIAGNOSIS — E785 Hyperlipidemia, unspecified: Secondary | ICD-10-CM

## 2023-05-25 ENCOUNTER — Other Ambulatory Visit: Payer: Self-pay | Admitting: Family

## 2023-05-25 DIAGNOSIS — M1A0720 Idiopathic chronic gout, left ankle and foot, without tophus (tophi): Secondary | ICD-10-CM

## 2023-05-26 ENCOUNTER — Ambulatory Visit: Payer: 59 | Admitting: Family

## 2023-05-28 ENCOUNTER — Ambulatory Visit: Payer: 59 | Admitting: Nurse Practitioner

## 2023-06-18 ENCOUNTER — Ambulatory Visit: Admitting: Family

## 2023-07-20 ENCOUNTER — Encounter: Payer: Self-pay | Admitting: Family

## 2023-07-20 ENCOUNTER — Ambulatory Visit (INDEPENDENT_AMBULATORY_CARE_PROVIDER_SITE_OTHER): Admitting: Family

## 2023-07-20 VITALS — BP 139/75 | HR 51 | Temp 97.9°F | Ht 74.0 in | Wt 186.4 lb

## 2023-07-20 DIAGNOSIS — E1169 Type 2 diabetes mellitus with other specified complication: Secondary | ICD-10-CM | POA: Diagnosis not present

## 2023-07-20 DIAGNOSIS — F322 Major depressive disorder, single episode, severe without psychotic features: Secondary | ICD-10-CM | POA: Insufficient documentation

## 2023-07-20 DIAGNOSIS — E1142 Type 2 diabetes mellitus with diabetic polyneuropathy: Secondary | ICD-10-CM

## 2023-07-20 DIAGNOSIS — F251 Schizoaffective disorder, depressive type: Secondary | ICD-10-CM

## 2023-07-20 DIAGNOSIS — E1159 Type 2 diabetes mellitus with other circulatory complications: Secondary | ICD-10-CM

## 2023-07-20 DIAGNOSIS — Z0001 Encounter for general adult medical examination with abnormal findings: Secondary | ICD-10-CM | POA: Diagnosis not present

## 2023-07-20 DIAGNOSIS — Z0000 Encounter for general adult medical examination without abnormal findings: Secondary | ICD-10-CM

## 2023-07-20 DIAGNOSIS — M1A0720 Idiopathic chronic gout, left ankle and foot, without tophus (tophi): Secondary | ICD-10-CM | POA: Diagnosis not present

## 2023-07-20 DIAGNOSIS — E785 Hyperlipidemia, unspecified: Secondary | ICD-10-CM | POA: Diagnosis not present

## 2023-07-20 DIAGNOSIS — K219 Gastro-esophageal reflux disease without esophagitis: Secondary | ICD-10-CM

## 2023-07-20 DIAGNOSIS — F333 Major depressive disorder, recurrent, severe with psychotic symptoms: Secondary | ICD-10-CM | POA: Diagnosis not present

## 2023-07-20 DIAGNOSIS — F17210 Nicotine dependence, cigarettes, uncomplicated: Secondary | ICD-10-CM | POA: Diagnosis not present

## 2023-07-20 DIAGNOSIS — F3341 Major depressive disorder, recurrent, in partial remission: Secondary | ICD-10-CM

## 2023-07-20 DIAGNOSIS — I152 Hypertension secondary to endocrine disorders: Secondary | ICD-10-CM | POA: Diagnosis not present

## 2023-07-20 DIAGNOSIS — F1020 Alcohol dependence, uncomplicated: Secondary | ICD-10-CM

## 2023-07-20 LAB — LIPID PANEL

## 2023-07-20 LAB — BAYER DCA HB A1C WAIVED: HB A1C (BAYER DCA - WAIVED): 6.8 % — ABNORMAL HIGH (ref 4.8–5.6)

## 2023-07-20 NOTE — Progress Notes (Signed)
 Subjective:    Patient ID: Frank Moses, male    DOB: [birth year], 58 y.o.   MRN: [id number]  Chief Complaint  Patient presents with   Medical Management of Chronic Issues   PT presents to the office today for CPE and chronic follow up.   He is followed by [facility] every 6 month for PTSD, depression, and Schizoaffective.    Reports drinking 6-7 beers a day most days of the week. He is trying to quit.  Has had three DUI's in the past, but trying to get his license back. He reports he trying to cut back.    He has gout and takes allopurinol  300 mg. States his last flare up was over a year ago. Hypertension This is a chronic problem. The current episode started more than 1 year ago. The problem has been resolved since onset. The problem is controlled. Associated symptoms include malaise/fatigue. Pertinent negatives include no blurred vision, peripheral edema or shortness of breath. Risk factors for coronary artery disease include dyslipidemia, male gender, sedentary lifestyle and smoking/tobacco exposure. The current treatment provides moderate improvement.  Diabetes He presents for his follow-up diabetic visit. He has type 2 diabetes mellitus. Associated symptoms include fatigue and foot paresthesias. Pertinent negatives for diabetes include no blurred vision. Symptoms are stable. Diabetic complications include peripheral neuropathy. Risk factors for coronary artery disease include dyslipidemia, diabetes mellitus, hypertension, sedentary lifestyle and post-menopausal. He is following a generally unhealthy diet. (Does not check glucose at home) Eye exam is current.  Gastroesophageal Reflux He complains of belching, heartburn and a hoarse voice. This is a chronic problem. The current episode started more than 1 year ago. The problem occurs occasionally. The symptoms are aggravated by certain foods. Associated symptoms include fatigue. Risk factors include smoking/tobacco exposure. He has  tried a PPI for the symptoms. The treatment provided moderate relief.  Hyperlipidemia This is a chronic problem. The current episode started more than 1 year ago. The problem is uncontrolled. Recent lipid tests were reviewed and are high. Pertinent negatives include no shortness of breath. Current antihyperlipidemic treatment includes statins. The current treatment provides moderate improvement of lipids. Risk factors for coronary artery disease include dyslipidemia, diabetes mellitus, hypertension, a sedentary lifestyle and post-menopausal.  Depression        This is a chronic problem.  The current episode started more than 1 year ago.   The problem occurs intermittently.  Associated symptoms include fatigue, helplessness, hopelessness, decreased interest and sad.  Past treatments include SNRIs - Serotonin and norepinephrine reuptake inhibitors. Nicotine  Dependence Presents for follow-up visit. Symptoms include fatigue. The symptoms have been stable. He smokes < 1/2 a pack of cigarettes per day.      Review of Systems  Constitutional:  Positive for fatigue and malaise/fatigue.  HENT:  Positive for hoarse voice.   Eyes:  Negative for blurred vision.  Respiratory:  Negative for shortness of breath.   Gastrointestinal:  Positive for heartburn.  All other systems reviewed and are negative.  Family History  Problem Relation Age of Onset   Alcoholism Father    Cancer Father        unknown    Social History   Socioeconomic History   Marital status: Single    Spouse name: Not on file   Number of children: 2   Years of education: Not on file   Highest education level: Not on file  Occupational History   Not on file  Tobacco Use   Smoking status:  Every Day    Current packs/day: 0.50    Types: Cigarettes   Smokeless tobacco: Never  Vaping Use   Vaping status: Former  Substance and Sexual Activity   Alcohol use: Yes    Comment: 3-6 cans of beer every 3-4 days   Drug use: Never    Sexual activity: Yes    Birth control/protection: None  Other Topics Concern   Not on file  Social History Narrative   ** Merged History Encounter **    Single.   2 daughters.   4 grandchildren, ages 37-10 in 2023.   Social Drivers of Corporate investment banker Strain: Low Risk  (04/02/2022)   Overall Financial Resource Strain (CARDIA)    Difficulty of Paying Living Expenses: Not hard at all  Food Insecurity: No Food Insecurity (04/02/2022)   Hunger Vital Sign    Worried About Running Out of Food in the Last Year: Never true    Ran Out of Food in the Last Year: Never true  Transportation Needs: No Transportation Needs (04/02/2022)   PRAPARE - Administrator, Civil Service (Medical): No    Lack of Transportation (Non-Medical): No  Physical Activity: Sufficiently Active (04/02/2022)   Exercise Vital Sign    Days of Exercise per Week: 5 days    Minutes of Exercise per Session: 30 min  Stress: No Stress Concern Present (04/02/2022)   [employer] of Occupational Health - Occupational Stress Questionnaire    Feeling of Stress : Not at all  Social Connections: Moderately Integrated (04/02/2022)   Social Connection and Isolation Panel    Frequency of Communication with Friends and Family: More than three times a week    Frequency of Social Gatherings with Friends and Family: More than three times a week    Attends Religious Services: 1 to 4 times per year    Active Member of [employer] or Organizations: Yes    Attends Banker Meetings: 1 to 4 times per year    Marital Status: Never married       Objective:   Physical Exam Vitals reviewed.  Constitutional:      General: He is not in acute distress.    Appearance: He is well-developed.  HENT:     Head: Normocephalic.     Right Ear: Tympanic membrane normal. No middle ear effusion.     Left Ear: Tympanic membrane normal.   Eyes:     General:        Right eye: No discharge.        Left eye: No discharge.      Pupils: Pupils are equal, round, and reactive to light.   Neck:     Thyroid : No thyromegaly.   Cardiovascular:     Rate and Rhythm: Normal rate and regular rhythm.     Heart sounds: Normal heart sounds. No murmur heard. Pulmonary:     Effort: Pulmonary effort is normal. No respiratory distress.     Breath sounds: Normal breath sounds. No wheezing.  Abdominal:     General: Bowel sounds are normal. There is no distension.     Palpations: Abdomen is soft.     Tenderness: There is no abdominal tenderness.   Musculoskeletal:        General: No tenderness. Normal range of motion.     Cervical back: Normal range of motion and neck supple.   Skin:    General: Skin is warm and dry.     Findings: No  erythema or rash.   Neurological:     Mental Status: He is alert and oriented to person, place, and time.     Cranial Nerves: No cranial nerve deficit.     Deep Tendon Reflexes: Reflexes are normal and symmetric.   Psychiatric:        Behavior: Behavior normal.        Thought Content: Thought content normal.        Judgment: Judgment normal.     Diabetic Foot Exam - Simple   Simple Foot Form Diabetic Foot exam was performed with the following findings: Yes 07/20/2023  3:15 PM  Visual Inspection No deformities, no ulcerations, no other skin breakdown bilaterally: Yes Sensation Testing Intact to touch and monofilament testing bilaterally: Yes Pulse Check Posterior Tibialis and Dorsalis pulse intact bilaterally: Yes Comments Toenails thick, short      BP 139/75   Pulse (!) 51   Temp 97.9 F (36.6 C) (Temporal)   Ht 6' 2 (1.88 m)   Wt 186 lb 6.4 oz (84.6 kg)   SpO2 96%   BMI 23.93 kg/m      Assessment & Plan:  KINO DUNSWORTH comes in today with chief complaint of Medical Management of Chronic Issues   Diagnosis and orders addressed:  1. Hypertension associated with type 2 diabetes mellitus (HCC) - CMP14+EGFR  2. MDD (major depressive disorder), recurrent, severe,  with psychosis (HCC) - CMP14+EGFR  3. Schizoaffective disorder, depressive type (HCC) - CMP14+EGFR  4. Hyperlipidemia associated with type 2 diabetes mellitus (HCC) - CMP14+EGFR  5. Gastroesophageal reflux disease without esophagitis - CMP14+EGFR  6. Recurrent major depressive disorder, in partial remission (HCC) - CMP14+EGFR  7. Chronic idiopathic gout involving toe of left foot without tophus - CMP14+EGFR  8. Cigarette nicotine  dependence without complication - CMP14+EGFR  9. Alcohol use disorder, severe, dependence (HCC) - CMP14+EGFR  10. Annual physical exam (Primary) - Microalbumin / creatinine urine ratio - Bayer DCA Hb A1c Waived - CBC with Differential/Platelet - Lipid panel - CMP14+EGFR - TSH - Vitamin B12  11. Moderately severe major depression (HCC) - CMP14+EGFR  12. Type 2 diabetes mellitus with other specified complication, without long-term current use of insulin (HCC) - Microalbumin / creatinine urine ratio - Bayer DCA Hb A1c Waived - CBC with Differential/Platelet - Lipid panel - CMP14+EGFR - TSH - Vitamin B12   Labs pending Continue current medications  Health Maintenance reviewed Diet and exercise encouraged  Follow up plan: 4 months   Bari Learn, FNP

## 2023-07-20 NOTE — Patient Instructions (Signed)
 Health Maintenance, Male  Adopting a healthy lifestyle and getting preventive care are important in promoting health and wellness. Ask your health care provider about:  The right schedule for you to have regular tests and exams.  Things you can do on your own to prevent diseases and keep yourself healthy.  What should I know about diet, weight, and exercise?  Eat a healthy diet    Eat a diet that includes plenty of vegetables, fruits, low-fat dairy products, and lean protein.  Do not eat a lot of foods that are high in solid fats, added sugars, or sodium.  Maintain a healthy weight  Body mass index (BMI) is a measurement that can be used to identify possible weight problems. It estimates body fat based on height and weight. Your health care provider can help determine your BMI and help you achieve or maintain a healthy weight.  Get regular exercise  Get regular exercise. This is one of the most important things you can do for your health. Most adults should:  Exercise for at least 150 minutes each week. The exercise should increase your heart rate and make you sweat (moderate-intensity exercise).  Do strengthening exercises at least twice a week. This is in addition to the moderate-intensity exercise.  Spend less time sitting. Even light physical activity can be beneficial.  Watch cholesterol and blood lipids  Have your blood tested for lipids and cholesterol at 58 years of age, then have this test every 5 years.  You may need to have your cholesterol levels checked more often if:  Your lipid or cholesterol levels are high.  You are older than 58 years of age.  You are at high risk for heart disease.  What should I know about cancer screening?  Many types of cancers can be detected early and may often be prevented. Depending on your health history and family history, you may need to have cancer screening at various ages. This may include screening for:  Colorectal cancer.  Prostate cancer.  Skin cancer.  Lung  cancer.  What should I know about heart disease, diabetes, and high blood pressure?  Blood pressure and heart disease  High blood pressure causes heart disease and increases the risk of stroke. This is more likely to develop in people who have high blood pressure readings or are overweight.  Talk with your health care provider about your target blood pressure readings.  Have your blood pressure checked:  Every 3-5 years if you are 9-95 years of age.  Every year if you are 85 years old or older.  If you are between the ages of 29 and 29 and are a current or former smoker, ask your health care provider if you should have a one-time screening for abdominal aortic aneurysm (AAA).  Diabetes  Have regular diabetes screenings. This checks your fasting blood sugar level. Have the screening done:  Once every three years after age 23 if you are at a normal weight and have a low risk for diabetes.  More often and at a younger age if you are overweight or have a high risk for diabetes.  What should I know about preventing infection?  Hepatitis B  If you have a higher risk for hepatitis B, you should be screened for this virus. Talk with your health care provider to find out if you are at risk for hepatitis B infection.  Hepatitis C  Blood testing is recommended for:  Everyone born from [birth year] through 1965.  Anyone  with known risk factors for hepatitis C.  Sexually transmitted infections (STIs)  You should be screened each year for STIs, including gonorrhea and chlamydia, if:  You are sexually active and are younger than 58 years of age.  You are older than 58 years of age and your health care provider tells you that you are at risk for this type of infection.  Your sexual activity has changed since you were last screened, and you are at increased risk for chlamydia or gonorrhea. Ask your health care provider if you are at risk.  Ask your health care provider about whether you are at high risk for HIV. Your health care provider  may recommend a prescription medicine to help prevent HIV infection. If you choose to take medicine to prevent HIV, you should first get tested for HIV. You should then be tested every 3 months for as long as you are taking the medicine.  Follow these instructions at home:  Alcohol use  Do not drink alcohol if your health care provider tells you not to drink.  If you drink alcohol:  Limit how much you have to 0-2 drinks a day.  Know how much alcohol is in your drink. In the [place] one drink equals one 12 oz bottle of beer (355 mL), one 5 oz glass of wine (148 mL), or one 1 oz glass of hard liquor (44 mL).  Lifestyle  Do not use any products that contain nicotine or tobacco. These products include cigarettes, chewing tobacco, and vaping devices, such as e-cigarettes. If you need help quitting, ask your health care provider.  Do not use street drugs.  Do not share needles.  Ask your health care provider for help if you need support or information about quitting drugs.  General instructions  Schedule regular health, dental, and eye exams.  Stay current with your vaccines.  Tell your health care provider if:  You often feel depressed.  You have ever been abused or do not feel safe at home.  Summary  Adopting a healthy lifestyle and getting preventive care are important in promoting health and wellness.  Follow your health care provider's instructions about healthy diet, exercising, and getting tested or screened for diseases.  Follow your health care provider's instructions on monitoring your cholesterol and blood pressure.  This information is not intended to replace advice given to you by your health care provider. Make sure you discuss any questions you have with your health care provider.  Document Revised: 06/04/2020 Document Reviewed: 06/04/2020  Elsevier Patient Education  2024 [employer].

## 2023-07-21 ENCOUNTER — Ambulatory Visit: Payer: Self-pay | Admitting: Family

## 2023-07-21 ENCOUNTER — Other Ambulatory Visit: Payer: Self-pay | Admitting: Family

## 2023-07-21 LAB — CMP14+EGFR
ALT: 22 IU/L (ref 0–44)
AST: 32 IU/L (ref 0–40)
Albumin: 4.5 g/dL (ref 3.8–4.9)
Alkaline Phosphatase: 107 IU/L (ref 44–121)
BUN/Creatinine Ratio: 11 (ref 9–20)
BUN: 9 mg/dL (ref 6–24)
Bilirubin Total: 0.6 mg/dL (ref 0.0–1.2)
CO2: 21 mmol/L (ref 20–29)
Calcium: 9.3 mg/dL (ref 8.7–10.2)
Chloride: 106 mmol/L (ref 96–106)
Creatinine, Ser: 0.83 mg/dL (ref 0.76–1.27)
Globulin, Total: 2.8 g/dL (ref 1.5–4.5)
Glucose: 124 mg/dL — ABNORMAL HIGH (ref 70–99)
Potassium: 4.8 mmol/L (ref 3.5–5.2)
Sodium: 143 mmol/L (ref 134–144)
Total Protein: 7.3 g/dL (ref 6.0–8.5)
eGFR: 101 mL/min/{1.73_m2} (ref >59)

## 2023-07-21 LAB — LIPID PANEL
Cholesterol, Total: 171 mg/dL (ref 100–199)
HDL: 59 mg/dL (ref >39)
LDL CALC COMMENT:: 2.9 ratio (ref 0.0–5.0)
LDL Chol Calc (NIH): 97 mg/dL (ref 0–99)
Triglycerides: 79 mg/dL (ref 0–149)
VLDL Cholesterol Cal: 15 mg/dL (ref 5–40)

## 2023-07-21 LAB — CBC WITH DIFFERENTIAL/PLATELET
Basophils Absolute: 0.1 10*3/uL (ref 0.0–0.2)
Basos: 1 %
EOS (ABSOLUTE): 0.2 10*3/uL (ref 0.0–0.4)
Eos: 3 %
Hematocrit: 44.6 % (ref 37.5–51.0)
Hemoglobin: 15.1 g/dL (ref 13.0–17.7)
Immature Grans (Abs): 0 10*3/uL (ref 0.0–0.1)
Immature Granulocytes: 0 %
Lymphocytes Absolute: 2 10*3/uL (ref 0.7–3.1)
Lymphs: 34 %
MCH: 32.8 pg (ref 26.6–33.0)
MCHC: 33.9 g/dL (ref 31.5–35.7)
MCV: 97 fL (ref 79–97)
Monocytes Absolute: 0.6 10*3/uL (ref 0.1–0.9)
Monocytes: 10 %
Neutrophils Absolute: 3.1 10*3/uL (ref 1.4–7.0)
Neutrophils: 52 %
Platelets: 206 10*3/uL (ref 150–450)
RBC: 4.61 x10E6/uL (ref 4.14–5.80)
RDW: 13.6 % (ref 11.6–15.4)
WBC: 5.9 10*3/uL (ref 3.4–10.8)

## 2023-07-21 LAB — MICROALBUMIN / CREATININE URINE RATIO
Creatinine, Urine: 122.5 mg/dL
Microalb/Creat Ratio: 168 mg/g{creat} — ABNORMAL HIGH (ref 0–29)
Microalbumin, Urine: 205.9 ug/mL

## 2023-07-21 LAB — VITAMIN B12: Vitamin B-12: 504 pg/mL (ref 232–1245)

## 2023-07-21 LAB — TSH: TSH: 0.701 u[IU]/mL (ref 0.450–4.500)

## 2023-07-21 MED ORDER — DAPAGLIFLOZIN PROPANEDIOL 10 MG PO TABS: 10.0000 mg | ORAL_TABLET | Freq: Every day | ORAL | 1 refills | Status: AC

## 2023-07-28 ENCOUNTER — Other Ambulatory Visit: Payer: Self-pay | Admitting: Family

## 2023-07-28 DIAGNOSIS — E1159 Type 2 diabetes mellitus with other circulatory complications: Secondary | ICD-10-CM

## 2023-08-24 ENCOUNTER — Other Ambulatory Visit: Payer: Self-pay | Admitting: Family

## 2023-08-24 DIAGNOSIS — M1A0720 Idiopathic chronic gout, left ankle and foot, without tophus (tophi): Secondary | ICD-10-CM

## 2023-09-02 ENCOUNTER — Ambulatory Visit

## 2023-09-10 ENCOUNTER — Other Ambulatory Visit: Payer: Self-pay | Admitting: Family

## 2023-09-10 DIAGNOSIS — E1169 Type 2 diabetes mellitus with other specified complication: Secondary | ICD-10-CM

## 2023-11-19 ENCOUNTER — Encounter: Payer: Self-pay | Admitting: Family

## 2023-11-19 ENCOUNTER — Ambulatory Visit: Admitting: Family

## 2023-12-07 ENCOUNTER — Telehealth: Payer: Self-pay

## 2023-12-07 NOTE — Progress Notes (Signed)
 Pharmacy Quality Measure Review  This patient is appearing on a report for being at risk of failing the adherence measure for hypertension (ACEi/ARB) medications this calendar year.   Medication: losartan  100 mg Last fill date: 07/28/23 for 90 day supply  Attempted to contact [facility] for medication management, unable to contact or leave a voicemail. Will attempt further outreach.   Woodie Jock, PharmD PGY1 Pharmacy Resident  12/07/2023

## 2024-01-07 ENCOUNTER — Encounter: Payer: Self-pay | Admitting: Family

## 2024-01-07 ENCOUNTER — Ambulatory Visit: Admitting: Family

## 2024-01-22 ENCOUNTER — Other Ambulatory Visit: Payer: Self-pay | Admitting: Family

## 2024-01-22 DIAGNOSIS — K219 Gastro-esophageal reflux disease without esophagitis: Secondary | ICD-10-CM

## 2024-01-25 ENCOUNTER — Encounter: Payer: Self-pay | Admitting: Family

## 2024-01-25 NOTE — Telephone Encounter (Signed)
"  Letter sent  "

## 2024-01-25 NOTE — Telephone Encounter (Signed)
30 days given today - NTBS 

## 2024-03-04 ENCOUNTER — Other Ambulatory Visit: Payer: Self-pay | Admitting: Family

## 2024-03-04 DIAGNOSIS — K219 Gastro-esophageal reflux disease without esophagitis: Secondary | ICD-10-CM

## 2024-03-29 ENCOUNTER — Ambulatory Visit: Admitting: Family
